# Patient Record
Sex: Female | Born: 1955 | Race: Black or African American | Hispanic: No | Smoking: Never smoker
Health system: Southern US, Community
[De-identification: ages and names within clinical notes are randomized; demographics above are authoritative.]

## PROBLEM LIST (undated history)

## (undated) DIAGNOSIS — N318 Other neuromuscular dysfunction of bladder: Secondary | ICD-10-CM

## (undated) DIAGNOSIS — E119 Type 2 diabetes mellitus without complications: Secondary | ICD-10-CM

## (undated) DIAGNOSIS — I700 Atherosclerosis of aorta: Secondary | ICD-10-CM

## (undated) DIAGNOSIS — E2839 Other primary ovarian failure: Secondary | ICD-10-CM

## (undated) DIAGNOSIS — E1140 Type 2 diabetes mellitus with diabetic neuropathy, unspecified: Secondary | ICD-10-CM

## (undated) DIAGNOSIS — I10 Essential (primary) hypertension: Secondary | ICD-10-CM

## (undated) DIAGNOSIS — F329 Major depressive disorder, single episode, unspecified: Secondary | ICD-10-CM

## (undated) DIAGNOSIS — Z923 Personal history of irradiation: Secondary | ICD-10-CM

## (undated) DIAGNOSIS — S335XXA Sprain of ligaments of lumbar spine, initial encounter: Secondary | ICD-10-CM

## (undated) DIAGNOSIS — G5600 Carpal tunnel syndrome, unspecified upper limb: Secondary | ICD-10-CM

## (undated) DIAGNOSIS — F32A Depression, unspecified: Secondary | ICD-10-CM

## (undated) DIAGNOSIS — J310 Chronic rhinitis: Secondary | ICD-10-CM

## (undated) DIAGNOSIS — N760 Acute vaginitis: Secondary | ICD-10-CM

## (undated) DIAGNOSIS — G4700 Insomnia, unspecified: Secondary | ICD-10-CM

## (undated) DIAGNOSIS — F419 Anxiety disorder, unspecified: Secondary | ICD-10-CM

## (undated) HISTORY — DX: Type 2 diabetes mellitus with diabetic neuropathy, unspecified: E11.40

## (undated) HISTORY — DX: Chronic rhinitis: J31.0

## (undated) HISTORY — DX: Sprain of ligaments of lumbar spine, initial encounter: S33.5XXA

## (undated) HISTORY — DX: Atherosclerosis of aorta: I70.0

## (undated) HISTORY — DX: Carpal tunnel syndrome, unspecified upper limb: G56.00

## (undated) HISTORY — DX: Insomnia, unspecified: G47.00

## (undated) HISTORY — PX: TUBAL LIGATION: SHX77

## (undated) HISTORY — DX: Other primary ovarian failure: E28.39

## (undated) HISTORY — DX: Essential (primary) hypertension: I10

## (undated) HISTORY — DX: Depression, unspecified: F32.A

## (undated) HISTORY — DX: Anxiety disorder, unspecified: F41.9

## (undated) HISTORY — DX: Acute vaginitis: N76.0

## (undated) HISTORY — DX: Major depressive disorder, single episode, unspecified: F32.9

## (undated) HISTORY — DX: Type 2 diabetes mellitus without complications: E11.9

## (undated) HISTORY — DX: Other neuromuscular dysfunction of bladder: N31.8

---

## 2004-04-26 ENCOUNTER — Ambulatory Visit: Payer: Self-pay | Admitting: Family Medicine

## 2005-04-24 ENCOUNTER — Ambulatory Visit: Payer: Self-pay | Admitting: Family Medicine

## 2007-06-08 ENCOUNTER — Ambulatory Visit: Payer: Self-pay | Admitting: Family Medicine

## 2007-06-17 ENCOUNTER — Ambulatory Visit: Payer: Self-pay | Admitting: Gastroenterology

## 2007-06-17 HISTORY — PX: COLONOSCOPY: SHX174

## 2007-09-06 ENCOUNTER — Emergency Department: Payer: Self-pay | Admitting: Emergency Medicine

## 2007-09-06 ENCOUNTER — Other Ambulatory Visit: Payer: Self-pay

## 2008-06-28 ENCOUNTER — Emergency Department: Payer: Self-pay | Admitting: Emergency Medicine

## 2010-03-12 ENCOUNTER — Ambulatory Visit: Payer: Self-pay | Admitting: Family Medicine

## 2010-05-20 ENCOUNTER — Ambulatory Visit: Payer: Self-pay | Admitting: Family Medicine

## 2010-09-10 ENCOUNTER — Ambulatory Visit: Payer: Self-pay | Admitting: Gastroenterology

## 2010-09-12 ENCOUNTER — Ambulatory Visit: Payer: Self-pay | Admitting: Gastroenterology

## 2011-02-24 ENCOUNTER — Emergency Department: Payer: Self-pay | Admitting: Unknown Physician Specialty

## 2011-06-11 ENCOUNTER — Emergency Department: Payer: Self-pay | Admitting: Emergency Medicine

## 2011-06-12 ENCOUNTER — Inpatient Hospital Stay: Payer: Self-pay | Admitting: Internal Medicine

## 2011-06-12 LAB — COMPREHENSIVE METABOLIC PANEL
Albumin: 3.4 g/dL (ref 3.4–5.0)
BUN: 15 mg/dL (ref 7–18)
Bilirubin,Total: 0.7 mg/dL (ref 0.2–1.0)
Calcium, Total: 8.9 mg/dL (ref 8.5–10.1)
Chloride: 102 mmol/L (ref 98–107)
Creatinine: 0.86 mg/dL (ref 0.60–1.30)
EGFR (Non-African Amer.): >60
Osmolality: 272 (ref 275–301)
Potassium: 3.3 mmol/L — ABNORMAL LOW (ref 3.5–5.1)
SGPT (ALT): 19 U/L
Total Protein: 8 g/dL (ref 6.4–8.2)

## 2011-06-12 LAB — CBC
HCT: 38.2 % (ref 35.0–47.0)
MCHC: 32.7 g/dL (ref 32.0–36.0)
MCV: 89 fL (ref 80–100)
RBC: 4.29 10*6/uL (ref 3.80–5.20)
WBC: 33.3 10*3/uL — ABNORMAL HIGH (ref 3.6–11.0)

## 2011-06-12 LAB — MAGNESIUM: Magnesium: 2 mg/dL

## 2011-06-12 LAB — URINALYSIS, COMPLETE
Glucose,UR: >=500 mg/dL (ref 0–75)
Ketone: NEGATIVE
Ph: 6 (ref 4.5–8.0)
RBC,UR: 3 /HPF (ref 0–5)
Specific Gravity: 1.012 (ref 1.003–1.030)
Squamous Epithelial: 13
WBC UR: 6 /HPF (ref 0–5)

## 2011-06-12 LAB — PRO B NATRIURETIC PEPTIDE: B-Type Natriuretic Peptide: 205 pg/mL — ABNORMAL HIGH (ref 0–125)

## 2011-06-12 LAB — CK-MB
CK-MB: <0.5 ng/mL — ABNORMAL LOW (ref 0.5–3.6)
CK-MB: <0.5 ng/mL — ABNORMAL LOW (ref 0.5–3.6)

## 2011-06-12 LAB — HEMOGLOBIN A1C: Hemoglobin A1C: 6.5 % — ABNORMAL HIGH (ref 4.2–6.3)

## 2011-06-12 LAB — CK: CK, Total: 38 U/L (ref 21–215)

## 2011-06-12 LAB — TROPONIN I: Troponin-I: <0.02 ng/mL

## 2011-06-13 LAB — LIPID PANEL: Ldl Cholesterol, Calc: 109 mg/dL — ABNORMAL HIGH (ref 0–100)

## 2011-06-13 LAB — CBC WITH DIFFERENTIAL/PLATELET
Basophil #: 0 10*3/uL (ref 0.0–0.1)
Basophil %: 0 %
Eosinophil #: 0 10*3/uL (ref 0.0–0.7)
HCT: 37.2 % (ref 35.0–47.0)
HGB: 11.9 g/dL — ABNORMAL LOW (ref 12.0–16.0)
Lymphocyte %: 4.1 %
MCH: 28.3 pg (ref 26.0–34.0)
MCHC: 32.1 g/dL (ref 32.0–36.0)
Monocyte #: 0.3 x10 3/mm (ref 0.2–0.9)
Monocyte %: 1.1 %
Neutrophil #: 25.5 10*3/uL — ABNORMAL HIGH (ref 1.4–6.5)
WBC: 26.9 10*3/uL — ABNORMAL HIGH (ref 3.6–11.0)

## 2011-06-13 LAB — BASIC METABOLIC PANEL
Anion Gap: 10 (ref 7–16)
BUN: 19 mg/dL — ABNORMAL HIGH (ref 7–18)
Calcium, Total: 9.1 mg/dL (ref 8.5–10.1)
Chloride: 105 mmol/L (ref 98–107)
Co2: 23 mmol/L (ref 21–32)
Creatinine: 0.74 mg/dL (ref 0.60–1.30)
EGFR (African American): >60
Glucose: 160 mg/dL — ABNORMAL HIGH (ref 65–99)
Osmolality: 281 (ref 275–301)
Potassium: 4 mmol/L (ref 3.5–5.1)
Sodium: 138 mmol/L (ref 136–145)

## 2011-06-13 LAB — TROPONIN I: Troponin-I: <0.02 ng/mL

## 2011-06-13 LAB — CK-MB: CK-MB: <0.5 ng/mL — ABNORMAL LOW (ref 0.5–3.6)

## 2011-06-15 LAB — CBC WITH DIFFERENTIAL/PLATELET
Basophil #: 0 10*3/uL (ref 0.0–0.1)
Eosinophil #: 0 10*3/uL (ref 0.0–0.7)
Eosinophil %: 0 %
HCT: 36.1 % (ref 35.0–47.0)
HGB: 12 g/dL (ref 12.0–16.0)
MCH: 29.2 pg (ref 26.0–34.0)
MCV: 88 fL (ref 80–100)
Platelet: 225 10*3/uL (ref 150–440)
RBC: 4.11 10*6/uL (ref 3.80–5.20)
RDW: 15 % — ABNORMAL HIGH (ref 11.5–14.5)
WBC: 14.2 10*3/uL — ABNORMAL HIGH (ref 3.6–11.0)

## 2011-06-16 LAB — EXPECTORATED SPUTUM ASSESSMENT W GRAM STAIN, RFLX TO RESP C

## 2011-08-21 ENCOUNTER — Ambulatory Visit: Payer: Self-pay | Admitting: Obstetrics and Gynecology

## 2012-08-16 ENCOUNTER — Ambulatory Visit: Payer: Self-pay | Admitting: Obstetrics and Gynecology

## 2012-08-23 ENCOUNTER — Ambulatory Visit: Payer: Self-pay | Admitting: Obstetrics and Gynecology

## 2012-10-01 ENCOUNTER — Other Ambulatory Visit: Payer: Self-pay | Admitting: Family Medicine

## 2012-10-01 LAB — TSH: Thyroid Stimulating Horm: 0.602 u[IU]/mL

## 2013-01-04 ENCOUNTER — Emergency Department: Payer: Self-pay | Admitting: Emergency Medicine

## 2013-01-04 LAB — URINALYSIS, COMPLETE
Glucose,UR: NEGATIVE mg/dL (ref 0–75)
Ketone: NEGATIVE
Nitrite: NEGATIVE
Ph: 6 (ref 4.5–8.0)
Protein: NEGATIVE
Specific Gravity: 1.009 (ref 1.003–1.030)
Squamous Epithelial: 4
WBC UR: 1 /HPF (ref 0–5)

## 2013-01-04 LAB — CBC
HCT: 37.8 % (ref 35.0–47.0)
HGB: 12.7 g/dL (ref 12.0–16.0)
MCH: 29.5 pg (ref 26.0–34.0)
MCV: 88 fL (ref 80–100)
Platelet: 200 10*3/uL (ref 150–440)
RBC: 4.31 10*6/uL (ref 3.80–5.20)
RDW: 14.1 % (ref 11.5–14.5)
WBC: 10.4 10*3/uL (ref 3.6–11.0)

## 2013-01-04 LAB — COMPREHENSIVE METABOLIC PANEL
Albumin: 4 g/dL (ref 3.4–5.0)
Alkaline Phosphatase: 105 U/L (ref 50–136)
BUN: 19 mg/dL — ABNORMAL HIGH (ref 7–18)
Bilirubin,Total: 0.3 mg/dL (ref 0.2–1.0)
Calcium, Total: 9.4 mg/dL (ref 8.5–10.1)
Chloride: 106 mmol/L (ref 98–107)
Co2: 30 mmol/L (ref 21–32)
Creatinine: 0.75 mg/dL (ref 0.60–1.30)
EGFR (African American): >60
EGFR (Non-African Amer.): >60
Glucose: 124 mg/dL — ABNORMAL HIGH (ref 65–99)
Osmolality: 279 (ref 275–301)
Potassium: 3.5 mmol/L (ref 3.5–5.1)
SGOT(AST): 21 U/L (ref 15–37)
SGPT (ALT): 18 U/L (ref 12–78)
Sodium: 138 mmol/L (ref 136–145)
Total Protein: 7.6 g/dL (ref 6.4–8.2)

## 2013-01-04 LAB — PROTIME-INR
INR: 0.9
Prothrombin Time: 12.4 secs (ref 11.5–14.7)

## 2013-01-17 ENCOUNTER — Encounter: Payer: Self-pay | Admitting: Family Medicine

## 2013-01-31 ENCOUNTER — Encounter: Payer: Self-pay | Admitting: Family Medicine

## 2013-06-06 ENCOUNTER — Ambulatory Visit: Payer: Self-pay | Admitting: Family Medicine

## 2013-07-01 ENCOUNTER — Ambulatory Visit: Payer: Self-pay | Admitting: Family Medicine

## 2013-12-28 ENCOUNTER — Ambulatory Visit: Payer: Self-pay | Admitting: Obstetrics and Gynecology

## 2014-02-21 ENCOUNTER — Encounter: Payer: Self-pay | Admitting: *Deleted

## 2014-03-01 ENCOUNTER — Ambulatory Visit: Payer: Self-pay | Admitting: Cardiovascular Disease

## 2014-03-06 ENCOUNTER — Encounter (INDEPENDENT_AMBULATORY_CARE_PROVIDER_SITE_OTHER): Payer: Self-pay

## 2014-03-06 ENCOUNTER — Encounter: Payer: Self-pay | Admitting: Cardiovascular Disease

## 2014-03-06 ENCOUNTER — Ambulatory Visit (INDEPENDENT_AMBULATORY_CARE_PROVIDER_SITE_OTHER): Payer: BLUE CROSS/BLUE SHIELD | Admitting: Cardiovascular Disease

## 2014-03-06 VITALS — BP 114/64 | HR 67 | Ht 63.0 in | Wt 132.0 lb

## 2014-03-06 DIAGNOSIS — R079 Chest pain, unspecified: Secondary | ICD-10-CM

## 2014-03-06 NOTE — Patient Instructions (Addendum)
[facility] MYOVIEW  Your caregiver has ordered a Stress Test with nuclear imaging. The purpose of this test is to evaluate the blood supply to your heart muscle. This procedure is referred to as a "Non-Invasive Stress Test." This is because other than having an IV started in your vein, nothing is inserted or "invades" your body. Cardiac stress tests are done to find areas of poor blood flow to the heart by determining the extent of coronary artery disease (CAD). Some patients exercise on a treadmill, which naturally increases the blood flow to your heart, while others who are  unable to walk on a treadmill due to physical limitations have a pharmacologic/chemical stress agent called Lexiscan . This medicine will mimic walking on a treadmill by temporarily increasing your coronary blood flow.   Please note: these test may take anywhere between 2-4 hours to complete  PLEASE REPORT TO [facility]  THE VOLUNTEERS AT THE FIRST DESK WILL DIRECT YOU WHERE TO GO  Date of Procedure:_____________________________________  Arrival Time for Procedure:______________________________  *Please call our office at [phone] to let us know a good time to set this up  Instructions regarding medication:   _X_ : Hold diabetes medication morning of procedure  PLEASE NOTIFY THE OFFICE AT LEAST 24 HOURS IN ADVANCE IF YOU ARE UNABLE TO KEEP YOUR APPOINTMENT.  [phone] AND  PLEASE NOTIFY NUCLEAR MEDICINE AT [facility] AT LEAST 24 HOURS IN ADVANCE IF YOU ARE UNABLE TO KEEP YOUR APPOINTMENT. [phone]  How to prepare for your Myoview test:  1. Do not eat or drink after midnight 2. No caffeine for 24 hours prior to test 3. No smoking 24 hours prior to test. 4. Your medication may be taken with water.  If your doctor stopped a medication because of this test, do not take that medication. 5. Ladies, please do not wear dresses.  Skirts or pants are appropriate. Please wear a short sleeve shirt. 6. No perfume,  cologne or lotion. 7. Wear comfortable walking shoes. No heels!   Call or return to clinic prn if these symptoms worsen or fail to improve as anticipated.

## 2014-03-10 ENCOUNTER — Encounter: Payer: Self-pay | Admitting: Cardiovascular Disease

## 2014-03-10 DIAGNOSIS — R079 Chest pain, unspecified: Secondary | ICD-10-CM | POA: Insufficient documentation

## 2014-03-10 NOTE — Assessment & Plan Note (Signed)
The patient had chest pain with some anginal and some atypical features. Risk factors include age and type 2 diabetes. I recommend evaluation with a treadmill nuclear stress test. I discussed with the patient the importance of lifestyle changes in order to decrease the chance of future coronary artery disease and cardiovascular events. We discussed the importance of controlling risk factors, healthy diet as well as regular exercise. I also explained to him that a normal stress test does not rule out atherosclerosis.

## 2014-03-10 NOTE — Progress Notes (Signed)
Primary care physician: Dr. Thana Ates  HPI  This is a 59 year old female who was referred for evaluation of chest pain. She has no previous cardiac history. She was diagnosed with type 2 diabetes about one year ago. She has no history of hypertension or hyperlipidemia. She has known history of migraine. There is no family history of coronary artery disease.  She started having chest pain about 3 weeks ago. This is described as substernal aching sensation with radiation behind the neck. This has been associated with shortness of breath. The symptoms are usually exertional unless for about 5-10 minutes. They subside with rest. She reports being under stress lately which might be contributing.  Allergies  Allergen Reactions  . Terbinafine And Related      Current Outpatient Prescriptions on File Prior to Visit  Medication Sig Dispense Refill  . aspirin 81 MG tablet Take 81 mg by mouth daily.    . metFORMIN (GLUCOPHAGE) 500 MG tablet Take 500 mg by mouth daily with breakfast.    . SUMAtriptan (IMITREX) 100 MG tablet Take 100 mg by mouth every 2 (two) hours as needed for migraine or headache. May repeat in 2 hours if headache persists or recurs.     No current facility-administered medications on file prior to visit.     Past Medical History  Diagnosis Date  . Hypertension   . Anxiety and depression   . Rhinitis   . Ovarian failure   . Insomnia   . Depressive disorder   . Hyperactivity of bladder   . Atherosclerosis of aorta   . Diabetic neuropathy   . Vaginitis and vulvovaginitis   . Lumbar sprain   . CTS (carpal tunnel syndrome)   . Diabetes mellitus without complication      Past Surgical History  Procedure Laterality Date  . Tubal ligation    . Cesarean section       Family History  Problem Relation Age of Onset  . Hypertension Mother   . Diabetes Mother      History   Social History  . Marital Status: Divorced    Spouse Name: N/A    Number of Children: N/A    . Years of Education: N/A   Occupational History  . Not on file.   Social History Main Topics  . Smoking status: Never Smoker   . Smokeless tobacco: Never Used  . Alcohol Use: No  . Drug Use: No  . Sexual Activity: Not on file   Other Topics Concern  . Not on file   Social History Narrative     ROS A 10 point review of system was performed. It is negative other than that mentioned in the history of present illness.   PHYSICAL EXAM   BP 114/64 mmHg  Pulse 67  Ht  (1.6 m)  Wt 132 lb (59.875 kg)  BMI 23.39 kg/m2   Constitutional: She is oriented to person, place, and time. She appears well-developed and well-nourished. No distress.  HENT: No nasal discharge.  Head: Normocephalic and atraumatic.  Eyes: Pupils are equal and round. No discharge.  Neck: Normal range of motion. Neck supple. No JVD present. No thyromegaly present.  Cardiovascular: Normal rate, regular rhythm, normal heart sounds. Exam reveals no gallop and no friction rub. No murmur heard.  Pulmonary/Chest: Effort normal and breath sounds normal. No stridor. No respiratory distress. She has no wheezes. She has no rales. She exhibits no tenderness.  Abdominal: Soft. Bowel sounds are normal. She exhibits no distension.  There is no tenderness. There is no rebound and no guarding.  Musculoskeletal: Normal range of motion. She exhibits no edema and no tenderness.  Neurological: She is alert and oriented to person, place, and time. Coordination normal.  Skin: Skin is warm and dry. No rash noted. She is not diaphoretic. No erythema. No pallor.  Psychiatric: She has a normal mood and affect. Her behavior is normal. Judgment and thought content normal.     ZOX:WRUEAEKG:Sinus  Rhythm  Low voltage in precordial leads.   -  Nonspecific T-abnormality.   ABNORMAL    ASSESSMENT AND PLAN

## 2014-06-25 NOTE — Discharge Summary (Signed)
PATIENT NAME:  Laurie Daniels, Laurie Daniels MR#:  [id number] DATE OF BIRTH:  03/13/1955  DATE OF ADMISSION:  06/12/2011 DATE OF DISCHARGE:  06/16/2011  PRIMARY CARE PHYSICIAN: Laurie MascotLemont Morrisey, MD  PULMONOLOGIST: Dr. Kendrick Daniels at [facility].   DISCHARGE DIAGNOSES:  1. Acute hypoxic respiratory failure with left lower lobe pneumonia and acute asthma exacerbation, improving on steroids and antibiotic.  2. Leukocytosis likely due to steroids and/or infection, improving.  3. Hypokalemia, repleted and resolved. 4. Hyperglycemia with hemoglobin A1c of 6.4 likely due to steroids.   SECONDARY DIAGNOSES: History of asthma.   CONSULTATIONS: None.   PROCEDURES/RADIOLOGY: Chest x-ray on 04/11 showed right lung field is clear. Left lower lobe pneumonia.   MAJOR LABORATORY PANEL: Urinalysis on admission was showing trace bacteria, 6 WBCs. Blood cultures x2 were negative on admission. Sputum culture was within normal limits on 04/13.   HISTORY AND SHORT HOSPITAL COURSE: The patient is a 59 year old female with the above-mentioned medical problems who was admitted for acute hypoxic respiratory failure thought to be secondary to left lower lobe pneumonia with acute asthma exacerbation. Please see Dr. Doreatha MartinKamran Lateef's dictated history and physical for further details. She was started on IV antibiotics along with steroids, was slowly improving on the above regimen. Her sugar was elevated, which was thought to be due to steroids. She also had some leukocytosis which was also thought to be due to steroids and/or infection. Her potassium was low and was repleted while in the hospital and had normalized.   Her breathing was much better on the 10th of April and was discharged home in stable condition.   VITAL SIGNS: On the date of discharge her vital signs were as follows: Temperature 98.4, heart rate 94 per minute, respirations 16 per minute, blood pressure 131/69 mm/Hg. She was saturating 96% on room air.   PERTINENT  PHYSICAL EXAMINATION: CARDIOVASCULAR: S1, S2 normal. No murmur, rubs, or gallop. LUNGS: Clear to auscultation bilaterally. No wheezing, rales, rhonchi, or crepitation. ABDOMEN: Soft, benign. NEUROLOGIC: Nonfocal examination. All other physical examination remained at baseline.   DISCHARGE MEDICATIONS:  1. Multivitamin once daily. 2. Aleve 2 tablets once a day.  3. Augmentin 875 mg p.o. b.i.d. for seven days. 4. Prednisone 60 mg p.o. daily taper 10 mg daily until finished.  5. Tussionex p.o. b.i.d. for seven days. 6. Advair 250/50, 1 puff b.i.d.  7. Spiriva once daily.   DISCHARGE DIET: Low sodium, 1800 ADA.   DISCHARGE ACTIVITY: As tolerated.   DISCHARGE INSTRUCTIONS AND FOLLOW-UP: The patient was instructed to follow-up with her primary care physician, Dr. Dennison MascotLemont Daniels, in 1 to 2 weeks. She will need follow-up with  Pulmonary, Dr. Kendrick Daniels, in 2 to 3 weeks.   TOTAL TIME DISCHARGING THIS PATIENT: 55 minutes.   ____________________________ Ellamae SiaVipul S. Sherryll BurgerShah, MD vss:rbg D: 06/18/2011 10:35:13 ET T: 06/19/2011 11:08:06 ET JOB#: [id number]  cc: Laurie MascotLemont Morrisey, MD Lupita Leashouglas B. McQuaid, MD Ellamae SiaVIPUL S [facility] MD ELECTRONICALLY SIGNED 06/19/2011 13:22

## 2014-06-25 NOTE — H&P (Signed)
PATIENT NAME:  Laurie Daniels, SCALIA MR#:  [id number] DATE OF BIRTH:  1955/05/29  DATE OF ADMISSION:  06/12/2011  REFERRING PHYSICIAN: Dr. Ladona Ridgel   PRIMARY CARE PHYSICIAN: Dennison Mascot, MD   CHIEF COMPLAINT: "I have a cold in my chest".  HISTORY OF PRESENT ILLNESS: The patient is a pleasant 59 year old female with past medical history of asthma who presents to the Emergency Department with the above-mentioned chief complaint. The patient states that over the past three weeks she's had symptoms of what she describes as a chest cold including cough productive of reddish-brown sputum and some shortness of breath and also now with some wheezing. Symptoms have been persistent over the past three weeks and worse over the past week and past couple of days. She also reports subjective fever yesterday as well as chills. Her shortness of breath appears to be worsening. She is not on any treatment or therapy for her asthma at baseline and uses no inhalers at home. 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 She reports mild pain along the left posterior ribs induced by cough. The patient denies any hemoptysis. Denies overt chest pain. Otherwise, she is without specific complaints at this time. She came to the ER for further evaluation. She was noted to have moderate respiratory distress with some wheezing and some rhonchi. An ABG was obtained revealing hypoxemia. She was noted to have elevated WBC count of 33,000. Chest x-ray was obtained and suggestive of left lower lobe infiltrate suspicious for pneumonia. Thereafter, hospitalist services were contacted for further evaluation and for hospital admission.   Of note, the patient came to the ER yesterday for her symptoms but states that the wait was too long in the ER and thereafter she left and went home but did not feel any better, hence, she returned to the ER today.   PAST SURGICAL HISTORY: Cesarean section.    PAST MEDICAL HISTORY: Asthma.   ALLERGIES: No known drug allergies.   HOME MEDICATIONS:  1. Daily multivitamin once a day.  2. Aleve Cold and Sinus 220 mg two tablets p.o. p.r.n.  FAMILY HISTORY: Mother deceased at age 21, history of bone cancer. Father's deceased at age 44, history of lung cancer. Had history of tobacco abuse.   SOCIAL HISTORY: Negative for tobacco, alcohol, or illicit drug use. [place] sister and daughter. She works for a retirement home.   REVIEW OF SYSTEMS: CONSTITUTIONAL: Reports subjective fever and chills. Denies fatigue. Denies any recent changes in weight or weakness. Has some left posterior rib pain induced by cough. HEAD/EYES: Denies headache or blurry vision. ENT: Denies tinnitus, earache, nasal discharge, or sore throat. RESPIRATORY: Reports shortness of breath, cough, and wheezing. Denies hemoptysis. CARDIOVASCULAR: Denies chest pain or heart palpitations or lower extremity edema. GI: Denies nausea, vomiting, diarrhea, constipation, melena, hematochezia, or abdominal pain. GU: Denies any dysuria or hematuria. ENDOCRINE: Denies heat or cold tolerance. HEME/LYMPH: She denies any easy bruising or bleeding. INTEGUMENTARY: Denies rashes or lesions. MUSCULOSKELETAL: Has left posterior rib pain caused by cough, otherwise denies any back pain or any shoulder pain or any other joint pain currently. NEUROLOGIC: Denies headache, numbness, tingling, or dysarthria. PSYCHIATRIC: Denies depression or anxiety.   PHYSICAL EXAMINATION:   VITAL SIGNS: Temperature 97.3, pulse 117, respirations 22, blood pressure 133/66, oxygen sat 98% on room air.   GENERAL: The patient is alert and oriented. She is tachypneic and in mild respiratory  distress but able to speak in full sentences.   HEENT: Normocephalic, atraumatic. Pupils equal, round, and reactive to light and accommodation. Extraocular muscles are intact. Anicteric sclerae.  Conjunctivae pink. Hearing intact to voice. Nares without drainage. Oral mucosa moist without lesions.   NECK: Supple with full range of motion. No JVD, lymphadenopathy, or carotid bruits bilaterally. No thyromegaly or tenderness to palpation over thyroid gland.   CHEST: Increased respiratory effort. The patient is tachypneic but not using her accessory respiratory muscles to breathe. She has decreased and tight breath sounds bilaterally with scattered rhonchi and also left lower lobe crackles as well as mild diffuse wheezing.   CARDIOVASCULAR: S1, S2 positive. Regular rate and rhythm. No murmurs, rubs, or gallops. PMI is non-lateralized.   ABDOMEN: Soft, nontender, nondistended. Normoactive bowel sounds. No hepatosplenomegaly or palpable masses. No hernias.   EXTREMITIES: No clubbing, cyanosis, or edema. Pedal pulses are palpable bilaterally.   SKIN: No suspicious rashes or lesions. Skin turgor is good.   LYMPH: No cervical lymphadenopathy.   NEUROLOGICAL: The patient is alert, oriented x3. Cranial nerves II through XII grossly intact. No focal deficits.   PSYCH: Pleasant female with appropriate affect.   LABS/STUDIES: EKG with sinus tachycardia at 112 beats per minute with normal axis, normal intervals, nonspecific T wave abnormality. She also has inferior Q waves which were also noted on EKG from 06/28/2008, however, Q waves are more prominent on today's EKG.  ABG on room air pH 7.48, pCO2 31, pO2 61, bicarb 23.1.   CMP sodium 135, potassium 3.3, chloride 102, bicarb 25, BUN 15, creatinine 0.86, glucose 123, calcium 8.9, anion gap 8. Total protein, albumin, total bilirubin, AST, ALT and alkaline phosphatase levels within normal limits.   CBC within normal limits except for WBC elevated at 33.3.   Troponin less than 0.02. BNP 205.   Chest x-ray, PA and lateral, hazy infiltrate left lower lobe posteriorly and findings are consistent with pneumonia. Right lung field is clear. Heart size  is normal.   ASSESSMENT AND PLAN: This is a 59 year old female with history of asthma here with worsening productive cough associated with shortness of breath, wheezing, fevers and chills, and left posterior rib pain induced by cough noted to have left lower lobe pneumonia by chest x-ray who is hypoxemic by ABG; also noted to have leukocytosis.  IMPRESSION:  1. Acute hypoxemia with respiratory failure.  2. Left lower lobe pneumonia, likely community-acquired pneumonia.  3. Acute asthma exacerbation secondary to pneumonia.  4. Leukocytosis likely due to pneumonia.  5. Hypokalemia.  6. Abnormal EKG with inferior Q waves.   PLAN: 1. Will admit patient to the hospital for further evaluation and management.  2. Will keep patient on supplemental oxygen for now given her hypoxemia although her oxygen sats on room air are currently 98%. Will gradually wean her off oxygen once her overall clinical condition starts to improve.  3. Will obtain blood and sputum cultures and start empiric IV antibiotics in the form of Rocephin and azithromycin and monitor clinical response.  4. Will start IV steroids as well and provide bronchodilator support with scheduled nebs and also p.r.n. albuterol MDI.  5. For leukocytosis, will follow her WBC count closely with antibiotics and steroids and also follow-up culture data.  6. For hypokalemia, will replace by giving a dose of potassium chloride and then rechecking serum potassium in the morning and also check magnesium level.  7. In regards to her abnormal EKG, she has some inferior Q waves which  were noted on prior EKG but Q waves are more prominent on today's EKG. She denies any chest pain. She has no known risk factors for coronary artery disease. Will risk stratify by checking her fasting lipid profile. Also check hemoglobin A1c. Will cycle the patient's cardiac enzymes. Will consider aspirin therapy especially if she is noted to be hyperlipidemic but would also  recommend referring the patient to Cardiology as an outpatient for stress testing and for further evaluation but will defer this decision to primary team.  8. DVT prophylaxis with SCDs and TEDs.   CODE STATUS: FULL CODE.     TIME SPENT ON ADMISSION: Approximately 45 minutes.   ____________________________ Elon AlasKamran N. Birch Farino, MD knl:drc D: 06/12/2011 16:45:57 ET T: 06/12/2011 17:07:03 ET JOB#: [id number]  cc: Elon AlasKamran N. Phoebie Shad, MD, <Dictator> Dennison MascotLemont Morrisey, MD Elon AlasKAMRAN N Imaad Reuss MD ELECTRONICALLY SIGNED 06/24/2011 17:52

## 2014-10-12 ENCOUNTER — Encounter (INDEPENDENT_AMBULATORY_CARE_PROVIDER_SITE_OTHER): Payer: Self-pay

## 2014-10-12 ENCOUNTER — Ambulatory Visit: Payer: Self-pay | Admitting: Family Medicine

## 2014-10-12 ENCOUNTER — Ambulatory Visit (INDEPENDENT_AMBULATORY_CARE_PROVIDER_SITE_OTHER): Payer: BLUE CROSS/BLUE SHIELD | Admitting: Family Medicine

## 2014-10-12 ENCOUNTER — Encounter: Payer: Self-pay | Admitting: Family Medicine

## 2014-10-12 VITALS — BP 138/78 | HR 77 | Temp 98.0°F | Resp 16 | Ht 63.0 in | Wt 131.1 lb

## 2014-10-12 DIAGNOSIS — G4700 Insomnia, unspecified: Secondary | ICD-10-CM | POA: Diagnosis not present

## 2014-10-12 DIAGNOSIS — E785 Hyperlipidemia, unspecified: Secondary | ICD-10-CM

## 2014-10-12 DIAGNOSIS — R0789 Other chest pain: Secondary | ICD-10-CM

## 2014-10-12 DIAGNOSIS — R7303 Prediabetes: Secondary | ICD-10-CM | POA: Insufficient documentation

## 2014-10-12 DIAGNOSIS — T300 Burn of unspecified body region, unspecified degree: Secondary | ICD-10-CM | POA: Diagnosis not present

## 2014-10-12 DIAGNOSIS — K589 Irritable bowel syndrome without diarrhea: Secondary | ICD-10-CM

## 2014-10-12 DIAGNOSIS — R7309 Other abnormal glucose: Secondary | ICD-10-CM

## 2014-10-12 LAB — POCT GLYCOSYLATED HEMOGLOBIN (HGB A1C): Hemoglobin A1C: 6.2

## 2014-10-12 LAB — POCT UA - MICROALBUMIN: Microalbumin Ur, POC: 0 mg/L

## 2014-10-12 LAB — GLUCOSE, POCT (MANUAL RESULT ENTRY): POC Glucose: 122 mg/dl — AB (ref 70–99)

## 2014-10-12 MED ORDER — SILVER SULFADIAZINE 1 % EX CREA: 1.0000 "application " | TOPICAL_CREAM | Freq: Every day | CUTANEOUS | Status: DC

## 2014-10-12 MED ORDER — ZOLPIDEM TARTRATE 5 MG PO TABS: 5.0000 mg | ORAL_TABLET | Freq: Every evening | ORAL | Status: DC | PRN
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
## 2014-10-25 ENCOUNTER — Other Ambulatory Visit: Payer: Self-pay | Admitting: Family Medicine

## 2015-01-18 ENCOUNTER — Encounter: Payer: Self-pay | Admitting: Family Medicine

## 2015-01-18 ENCOUNTER — Ambulatory Visit (INDEPENDENT_AMBULATORY_CARE_PROVIDER_SITE_OTHER): Payer: BLUE CROSS/BLUE SHIELD | Admitting: Family Medicine

## 2015-01-18 VITALS — BP 128/70 | HR 77 | Temp 98.1°F | Resp 18 | Ht 63.0 in | Wt 123.5 lb

## 2015-01-18 DIAGNOSIS — E049 Nontoxic goiter, unspecified: Secondary | ICD-10-CM | POA: Diagnosis not present

## 2015-01-18 DIAGNOSIS — Z23 Encounter for immunization: Secondary | ICD-10-CM | POA: Diagnosis not present

## 2015-01-18 DIAGNOSIS — R002 Palpitations: Secondary | ICD-10-CM | POA: Diagnosis not present

## 2015-01-18 DIAGNOSIS — F411 Generalized anxiety disorder: Secondary | ICD-10-CM

## 2015-01-18 DIAGNOSIS — Z0000 Encounter for general adult medical examination without abnormal findings: Secondary | ICD-10-CM

## 2015-01-18 DIAGNOSIS — G4700 Insomnia, unspecified: Secondary | ICD-10-CM | POA: Diagnosis not present

## 2015-01-18 MED ORDER — PAROXETINE HCL 10 MG PO TABS: 10.0000 mg | ORAL_TABLET | Freq: Every day | ORAL | Status: DC

## 2015-01-18 MED ORDER — ZOLPIDEM TARTRATE 5 MG PO TABS: 5.0000 mg | ORAL_TABLET | Freq: Every evening | ORAL | Status: DC | PRN

## 2015-01-18 NOTE — Progress Notes (Signed)
Name: Laurie DredgeKatie N Daniels   MRN: [id number]    DOB: May 30, 1955   Date:01/18/2015       Progress Note  Subjective  Chief Complaint  Chief Complaint  Patient presents with  . Annual Exam    HPI  59 year old presenting for annual H&P.  Anxiety and palpitations  Patient admits to many sick family stressors. She also has a recurrent episodes of irregular heartbeat with palpitations. She seems not to be able to gain weight so she is eating ravenously she describes. She is not exercising with any regularity. There is a problem heat or cold intolerance and it is no history of hair loss. His note she has a sister who has had thyroid goiter and also thyroid cancer.  Goiter  Patient was noted on this exam to have enlargement of the thyroid gland and the above-mentioned symptoms under anxiety palpitations  Past Medical History  Diagnosis Date  . Hypertension   . Anxiety and depression   . Rhinitis   . Ovarian failure   . Insomnia   . Depressive disorder   . Hyperactivity of bladder   . Atherosclerosis of aorta (HCC)   . Diabetic neuropathy (HCC)   . Vaginitis and vulvovaginitis   . Lumbar sprain   . CTS (carpal tunnel syndrome)   . Diabetes mellitus without complication [facility](HCC)     Social History  Substance Use Topics  . Smoking status: Never Smoker   . Smokeless tobacco: Never Used  . Alcohol Use: No     Current outpatient prescriptions:  .  aspirin 81 MG tablet, Take 81 mg by mouth daily., Disp: , Rfl:  .  metFORMIN (GLUCOPHAGE) 500 MG tablet, TAKE 1 TABLET BY MOUTH EVERY DAY, Disp: 30 tablet, Rfl: 5 .  silver sulfADIAZINE (SILVADENE) 1 % cream, Apply 1 application topically daily., Disp: 50 g, Rfl: 0 .  silver sulfADIAZINE (SILVADENE) 1 % cream, Apply 1 application topically daily., Disp: 50 g, Rfl: 0 .  SUMAtriptan (IMITREX) 100 MG tablet, Take 100 mg by mouth every 2 (two) hours as needed for migraine or headache. May repeat in 2 hours if headache persists or recurs., Disp: ,  Rfl:  .  zolpidem (AMBIEN) 5 MG tablet, Take 1 tablet (5 mg total) by mouth at bedtime as needed for sleep., Disp: 15 tablet, Rfl: 1 .  zolpidem (AMBIEN) 5 MG tablet, Take 1 tablet (5 mg total) by mouth at bedtime as needed for sleep., Disp: 30 tablet, Rfl: 2  Allergies  Allergen Reactions  . Terbinafine And Related Other (See Comments)    hallucinations    Review of Systems  Constitutional: Negative for fever, chills and weight loss.  HENT: Negative for congestion, hearing loss, sore throat and tinnitus.   Eyes: Negative for blurred vision, double vision and redness.  Respiratory: Negative for cough, hemoptysis and shortness of breath.   Cardiovascular: Positive for palpitations. Negative for chest pain, orthopnea, claudication and leg swelling.  Gastrointestinal: Negative for heartburn, nausea, vomiting, diarrhea, constipation and blood in stool.       No dysphagia  Genitourinary: Negative for dysuria, urgency, frequency and hematuria.  Musculoskeletal: Negative for myalgias, back pain, joint pain, falls and neck pain.  Skin: Negative for itching.  Neurological: Negative for dizziness, tingling, tremors, focal weakness, seizures, loss of consciousness, weakness and headaches.  Endo/Heme/Allergies: Does not bruise/bleed easily.  Psychiatric/Behavioral: Negative for depression and substance abuse. The patient is nervous/anxious and has insomnia.      Objective  Filed Vitals:  01/18/15 0935  BP: 128/70  Pulse: 77  Temp: 98.1 F (36.7 C)  Resp: 18  Height:  (1.6 m)  Weight: 123 lb 8 oz (56.019 kg)  SpO2: 97%     Physical Exam  Constitutional: She is oriented to person, place, and time and well-developed, well-nourished, and in no distress.  HENT:  Head: Normocephalic.  Eyes: EOM are normal. Pupils are equal, round, and reactive to light.  Neck: Thyromegaly present.  The isthmus is palpably enlarged. It is nontender and not adherent to other tissues. No bruits  appreciated  Cardiovascular: Normal rate, regular rhythm and normal heart sounds.   No murmur heard. Pulmonary/Chest: Effort normal and breath sounds normal.  Breast exam is within normal limits  Abdominal: Soft. Bowel sounds are normal.  Genitourinary: Vagina normal, uterus normal, cervix normal, right adnexa normal and left adnexa normal. Guaiac negative stool. No vaginal discharge found.  Musculoskeletal: Normal range of motion. She exhibits no edema.  Neurological: She is alert and oriented to person, place, and time. No cranial nerve deficit. Gait normal.  Skin: Skin is warm and dry. No rash noted.  Psychiatric: Memory normal.  Patient is anxious and loquacious      Assessment & Plan  1. Annual physical exam  - Cytology - non pap - DG Bone Density; Future - CBC - Lipid Profile - TSH - Comprehensive Metabolic Panel (CMET)  2. Insomnia Probably associated with her anxiety - zolpidem (AMBIEN) 5 MG tablet; Take 1 tablet (5 mg total) by mouth at bedtime as needed for sleep.  Dispense: 15 tablet; Refill: 1  3. Need for influenza vaccination Given today - Flu Vaccine QUAD 36+ mos PF IM (Fluarix & Fluzone Quad PF)  4. Thyroid goiter Ultrasound of thyroid  5. Generalized anxiety disorder Trial of Paxil - PARoxetine (PAXIL) 10 MG tablet; Take 1 tablet (10 mg total) by mouth daily.  Dispense: 30 tablet; Refill: 5  6. Palpitations Probably associated with her anxiety but must also rule out hyperthyroidism to continue quarter - TSH - EKG 12-Lead

## 2015-01-26 LAB — PAP LB (LIQUID-BASED): PAP SMEAR COMMENT: 0

## 2015-02-19 ENCOUNTER — Encounter: Payer: Self-pay | Admitting: Family Medicine

## 2015-02-19 ENCOUNTER — Ambulatory Visit (INDEPENDENT_AMBULATORY_CARE_PROVIDER_SITE_OTHER): Payer: BLUE CROSS/BLUE SHIELD | Admitting: Family Medicine

## 2015-02-19 VITALS — BP 142/72 | HR 70 | Temp 98.7°F | Resp 18 | Ht 63.0 in | Wt 123.2 lb

## 2015-02-19 DIAGNOSIS — K580 Irritable bowel syndrome with diarrhea: Secondary | ICD-10-CM

## 2015-02-19 DIAGNOSIS — I10 Essential (primary) hypertension: Secondary | ICD-10-CM

## 2015-02-19 DIAGNOSIS — R739 Hyperglycemia, unspecified: Secondary | ICD-10-CM

## 2015-02-19 DIAGNOSIS — F411 Generalized anxiety disorder: Secondary | ICD-10-CM

## 2015-02-19 LAB — GLUCOSE, POCT (MANUAL RESULT ENTRY): POC GLUCOSE: 101 mg/dL — AB (ref 70–99)

## 2015-02-19 LAB — POCT GLYCOSYLATED HEMOGLOBIN (HGB A1C): Hemoglobin A1C: 6.2

## 2015-02-19 MED ORDER — METFORMIN HCL 500 MG PO TABS: 500.0000 mg | ORAL_TABLET | ORAL | Status: DC

## 2015-02-19 MED ORDER — PAROXETINE HCL 20 MG PO TABS: 20.0000 mg | ORAL_TABLET | Freq: Every day | ORAL | Status: DC

## 2015-02-19 NOTE — Progress Notes (Signed)
Name: Laurie DredgeKatie N Daniels   MRN: [id number]    DOB: March 28, 1955   Date:02/19/2015       Progress Note  Subjective  Chief Complaint  Chief Complaint  Patient presents with  . Palpitations    Pt here for 1 month follow up  . Thyroid Problem    Hypertension This is a chronic problem. The current episode started more than 1 year ago. The problem has been gradually worsening since onset. The problem is uncontrolled. Associated symptoms include anxiety and palpitations. Pertinent negatives include no blurred vision, chest pain, headaches, neck pain, orthopnea or shortness of breath. There are no associated agents to hypertension. Risk factors for coronary artery disease include diabetes mellitus, dyslipidemia, family history and stress.    Anxiety  Since being on paroxetine patient is noted some modest improvement. She continues to experience some episodes of Of palpitations but these are less frequent. She also experiences some intermittent queasy sensation in her stomach without true nausea or vomiting. She is in the midst of changing her her housing situation and she is renovating her moving into her mother's house who expired some years ago. This is helping to release some financial stress. Since going through divorce she has less stress related to her ex-husband's [place]. Her children are not a source of frustration currently. Finances however areas well as regards situation. There is no consideration of suicidal homicidal ideation. Crying spells less frequent.  Diabetes  Patient presents for follow-up of diabetes which is present for >3  years. Is currently on a regimen of ME TFORMIN 500 QD . Patient states COMPLIANCE  with their diet and exercise. There's been no hypoglycemic episodes and there 0 polyuria polydipsia polyphagia. His average fasting glucoses been in the low around - with a high around - . There is no end organ disease.  Last diabetic eye exam was -.   Last visit with dietitian  was 1 YR. Last microalbumin was obtained 8/16 =0 .     Past Medical History  Diagnosis Date  . Hypertension   . Anxiety and depression   . Rhinitis   . Ovarian failure   . Insomnia   . Depressive disorder   . Hyperactivity of bladder   . Atherosclerosis of aorta (HCC)   . Diabetic neuropathy (HCC)   . Vaginitis and vulvovaginitis   . Lumbar sprain   . CTS (carpal tunnel syndrome)   . Diabetes mellitus without complication [facility](HCC)     Social History  Substance Use Topics  . Smoking status: Never Smoker   . Smokeless tobacco: Never Used  . Alcohol Use: No     Current outpatient prescriptions:  .  aspirin 81 MG tablet, Take 81 mg by mouth daily., Disp: , Rfl:  .  metFORMIN (GLUCOPHAGE) 500 MG tablet, TAKE 1 TABLET BY MOUTH EVERY DAY, Disp: 30 tablet, Rfl: 5 .  PARoxetine (PAXIL) 10 MG tablet, Take 1 tablet (10 mg total) by mouth daily., Disp: 30 tablet, Rfl: 5 .  silver sulfADIAZINE (SILVADENE) 1 % cream, Apply 1 application topically daily., Disp: 50 g, Rfl: 0 .  silver sulfADIAZINE (SILVADENE) 1 % cream, Apply 1 application topically daily., Disp: 50 g, Rfl: 0 .  SUMAtriptan (IMITREX) 100 MG tablet, Take 100 mg by mouth every 2 (two) hours as needed for migraine or headache. May repeat in 2 hours if headache persists or recurs., Disp: , Rfl:  .  zolpidem (AMBIEN) 5 MG tablet, Take 1 tablet (5 mg total) by mouth at  bedtime as needed for sleep., Disp: 15 tablet, Rfl: 1  Allergies  Allergen Reactions  . Terbinafine And Related Other (See Comments)    hallucinations    Review of Systems  Constitutional: Negative for fever, chills and weight loss.  HENT: Negative for congestion, hearing loss, sore throat and tinnitus.   Eyes: Negative for blurred vision, double vision and redness.  Respiratory: Negative for cough, hemoptysis and shortness of breath.   Cardiovascular: Positive for palpitations. Negative for chest pain, orthopnea, claudication and leg swelling.   Gastrointestinal: Negative for heartburn, nausea, vomiting, diarrhea, constipation and blood in stool.  Genitourinary: Negative for dysuria, urgency, frequency and hematuria.  Musculoskeletal: Negative for myalgias, back pain, joint pain, falls and neck pain.  Skin: Negative for itching.  Neurological: Negative for dizziness, tingling, tremors, focal weakness, seizures, loss of consciousness, weakness and headaches.  Endo/Heme/Allergies: Does not bruise/bleed easily.  Psychiatric/Behavioral: Negative for depression and substance abuse. The patient is nervous/anxious. The patient does not have insomnia.      Objective  Filed Vitals:   02/19/15 0817  BP: 142/72  Pulse: 70  Temp: 98.7 F (37.1 C)  Resp: 18  Height:  (1.6 m)  Weight: 123 lb 3 oz (55.877 kg)  SpO2: 97%     Physical Exam  Constitutional: She is oriented to person, place, and time and well-developed, well-nourished, and in no distress.  HENT:  Head: Normocephalic.  Eyes: EOM are normal. Pupils are equal, round, and reactive to light.  Neck: Normal range of motion. No thyromegaly present.  Cardiovascular: Normal rate, regular rhythm and normal heart sounds.   No murmur heard. Pulmonary/Chest: Effort normal and breath sounds normal.  Abdominal: Soft. Bowel sounds are normal.  Musculoskeletal: Normal range of motion. She exhibits no edema.  Neurological: She is alert and oriented to person, place, and time. No cranial nerve deficit. Gait normal.  Skin: Skin is warm and dry. No rash noted.  Psychiatric: Memory and affect normal.      Assessment & Plan  1. Type II diabetes mellitus, well controlled (HCC)  - POCT HgB A1C - POCT Glucose (CBG)  2. Generalized anxiety disorder * - PARoxetine (PAXIL) 20 MG tablet; Take 1 tablet (20 mg total) by mouth daily.  Dispense: 30 tablet; Refill: 5  3. Irritable bowel syndrome with diarrhea 2ND TO 2  4. Essential hypertension ADD ACE OR ARG

## 2015-03-05 DIAGNOSIS — E1129 Type 2 diabetes mellitus with other diabetic kidney complication: Secondary | ICD-10-CM | POA: Insufficient documentation

## 2015-03-05 DIAGNOSIS — R809 Proteinuria, unspecified: Secondary | ICD-10-CM | POA: Insufficient documentation

## 2015-06-21 ENCOUNTER — Ambulatory Visit: Payer: BLUE CROSS/BLUE SHIELD | Admitting: Family Medicine

## 2015-07-24 ENCOUNTER — Ambulatory Visit: Payer: BLUE CROSS/BLUE SHIELD | Admitting: Family Medicine

## 2015-08-13 ENCOUNTER — Ambulatory Visit (INDEPENDENT_AMBULATORY_CARE_PROVIDER_SITE_OTHER): Payer: BLUE CROSS/BLUE SHIELD | Admitting: Family Medicine

## 2015-08-13 ENCOUNTER — Encounter: Payer: Self-pay | Admitting: Family Medicine

## 2015-08-13 VITALS — BP 118/76 | HR 79 | Temp 98.2°F | Resp 16 | Ht 63.0 in | Wt 127.7 lb

## 2015-08-13 DIAGNOSIS — F329 Major depressive disorder, single episode, unspecified: Secondary | ICD-10-CM

## 2015-08-13 DIAGNOSIS — K581 Irritable bowel syndrome with constipation: Secondary | ICD-10-CM | POA: Diagnosis not present

## 2015-08-13 DIAGNOSIS — Z1159 Encounter for screening for other viral diseases: Secondary | ICD-10-CM

## 2015-08-13 DIAGNOSIS — G43009 Migraine without aura, not intractable, without status migrainosus: Secondary | ICD-10-CM

## 2015-08-13 DIAGNOSIS — E049 Nontoxic goiter, unspecified: Secondary | ICD-10-CM | POA: Diagnosis not present

## 2015-08-13 DIAGNOSIS — G4700 Insomnia, unspecified: Secondary | ICD-10-CM

## 2015-08-13 DIAGNOSIS — E785 Hyperlipidemia, unspecified: Secondary | ICD-10-CM | POA: Diagnosis not present

## 2015-08-13 DIAGNOSIS — Z23 Encounter for immunization: Secondary | ICD-10-CM

## 2015-08-13 DIAGNOSIS — R351 Nocturia: Secondary | ICD-10-CM | POA: Insufficient documentation

## 2015-08-13 DIAGNOSIS — J309 Allergic rhinitis, unspecified: Secondary | ICD-10-CM | POA: Insufficient documentation

## 2015-08-13 DIAGNOSIS — E119 Type 2 diabetes mellitus without complications: Secondary | ICD-10-CM

## 2015-08-13 DIAGNOSIS — R634 Abnormal weight loss: Secondary | ICD-10-CM

## 2015-08-13 DIAGNOSIS — R5383 Other fatigue: Secondary | ICD-10-CM | POA: Diagnosis not present

## 2015-08-13 DIAGNOSIS — F411 Generalized anxiety disorder: Secondary | ICD-10-CM

## 2015-08-13 DIAGNOSIS — G5600 Carpal tunnel syndrome, unspecified upper limb: Secondary | ICD-10-CM | POA: Insufficient documentation

## 2015-08-13 DIAGNOSIS — I700 Atherosclerosis of aorta: Secondary | ICD-10-CM | POA: Insufficient documentation

## 2015-08-13 MED ORDER — MIRTAZAPINE 15 MG PO TBDP: 15.0000 mg | ORAL_TABLET | Freq: Every day | ORAL | Status: DC

## 2015-08-13 MED ORDER — POLYETHYLENE GLYCOL 3350 17 GM/SCOOP PO POWD: 17.0000 g | Freq: Every day | ORAL | Status: DC

## 2015-08-13 MED ORDER — CITALOPRAM HYDROBROMIDE 20 MG PO TABS: 20.0000 mg | ORAL_TABLET | Freq: Every day | ORAL | Status: DC
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
## 2015-08-14 ENCOUNTER — Other Ambulatory Visit
Admission: RE | Admit: 2015-08-14 | Discharge: 2015-08-14 | Disposition: A | Discharge status: Home / Self Care | Payer: BLUE CROSS/BLUE SHIELD | Source: Ambulatory Visit | Attending: Family Medicine | Admitting: Family Medicine

## 2015-08-14 DIAGNOSIS — R5383 Other fatigue: Secondary | ICD-10-CM | POA: Diagnosis present

## 2015-08-14 LAB — CBC WITH DIFFERENTIAL/PLATELET
Basophils Absolute: 0 10*3/uL (ref 0–0.1)
Eosinophils Absolute: 0.2 10*3/uL (ref 0–0.7)
Eosinophils Relative: 2 %
HEMATOCRIT: 39.7 % (ref 35.0–47.0)
HEMOGLOBIN: 13.3 g/dL (ref 12.0–16.0)
Lymphocytes Relative: 19 %
Lymphs Abs: 2 10*3/uL (ref 1.0–3.6)
MCH: 29 pg (ref 26.0–34.0)
MCHC: 33.5 g/dL (ref 32.0–36.0)
MCV: 86.5 fL (ref 80.0–100.0)
Monocytes Absolute: 0.7 10*3/uL (ref 0.2–0.9)
NEUTROS ABS: 8 10*3/uL — AB (ref 1.4–6.5)
Platelets: 205 10*3/uL (ref 150–440)
RBC: 4.59 MIL/uL (ref 3.80–5.20)
RDW: 14 % (ref 11.5–14.5)
WBC: 11 10*3/uL (ref 3.6–11.0)

## 2015-08-14 LAB — COMPREHENSIVE METABOLIC PANEL
ALBUMIN: 4.6 g/dL (ref 3.5–5.0)
ALT: 14 U/L (ref 14–54)
ANION GAP: 8 (ref 5–15)
AST: 17 U/L (ref 15–41)
Alkaline Phosphatase: 82 U/L (ref 38–126)
BILIRUBIN TOTAL: 0.6 mg/dL (ref 0.3–1.2)
BUN: 16 mg/dL (ref 6–20)
CALCIUM: 9.7 mg/dL (ref 8.9–10.3)
CO2: 28 mmol/L (ref 22–32)
Chloride: 103 mmol/L (ref 101–111)
Creatinine, Ser: 0.54 mg/dL (ref 0.44–1.00)
GFR calc Af Amer: >60 mL/min (ref >60)
GFR calc non Af Amer: >60 mL/min (ref >60)
GLUCOSE: 112 mg/dL — AB (ref 65–99)
Potassium: 3.9 mmol/L (ref 3.5–5.1)
Sodium: 139 mmol/L (ref 135–145)
TOTAL PROTEIN: 7.9 g/dL (ref 6.5–8.1)

## 2015-08-14 LAB — LIPID PANEL
CHOLESTEROL: 244 mg/dL — AB (ref 0–200)
HDL: 75 mg/dL (ref >40)
LDL Cholesterol: 153 mg/dL — ABNORMAL HIGH (ref 0–99)
Total CHOL/HDL Ratio: 3.3 RATIO
Triglycerides: 79 mg/dL (ref <150)
VLDL: 16 mg/dL (ref 0–40)

## 2015-08-14 LAB — VITAMIN B12: Vitamin B-12: 311 pg/mL (ref 180–914)

## 2015-08-15 ENCOUNTER — Ambulatory Visit (INDEPENDENT_AMBULATORY_CARE_PROVIDER_SITE_OTHER): Payer: BLUE CROSS/BLUE SHIELD

## 2015-08-15 ENCOUNTER — Other Ambulatory Visit: Payer: Self-pay | Admitting: Family Medicine

## 2015-08-15 DIAGNOSIS — D519 Vitamin B12 deficiency anemia, unspecified: Secondary | ICD-10-CM | POA: Diagnosis not present

## 2015-08-15 LAB — THYROID PANEL WITH TSH
FREE THYROXINE INDEX: 1.7 (ref 1.2–4.9)
T3 Uptake Ratio: 29 % (ref 24–39)
T4, Total: 6 ug/dL (ref 4.5–12.0)
TSH: 0.537 u[IU]/mL (ref 0.450–4.500)

## 2015-08-15 LAB — HIV ANTIBODY (ROUTINE TESTING W REFLEX): HIV SCREEN 4TH GENERATION: NONREACTIVE

## 2015-08-15 LAB — VITAMIN D 25 HYDROXY (VIT D DEFICIENCY, FRACTURES): Vit D, 25-Hydroxy: 30.3 ng/mL (ref 30.0–100.0)

## 2015-08-15 LAB — HEMOGLOBIN A1C: Hgb A1c MFr Bld: 6.3 % — ABNORMAL HIGH (ref 4.0–6.0)

## 2015-08-15 LAB — HEPATITIS C ANTIBODY

## 2015-08-15 MED ORDER — ATORVASTATIN CALCIUM 40 MG PO TABS: 40.0000 mg | ORAL_TABLET | Freq: Every day | ORAL | Status: DC

## 2015-08-15 MED ORDER — CYANOCOBALAMIN 1000 MCG/ML IJ SOLN
1000.0000 ug | Freq: Once | INTRAMUSCULAR | Status: AC
  Administered 2015-08-15: 1000 ug via INTRAMUSCULAR

## 2015-08-17 ENCOUNTER — Ambulatory Visit
Admission: RE | Admit: 2015-08-17 | Discharge: 2015-08-17 | Disposition: A | Discharge status: Home / Self Care | Payer: BLUE CROSS/BLUE SHIELD | Source: Ambulatory Visit | Attending: Family Medicine | Admitting: Family Medicine

## 2015-08-17 DIAGNOSIS — E042 Nontoxic multinodular goiter: Secondary | ICD-10-CM | POA: Insufficient documentation

## 2015-08-17 DIAGNOSIS — E049 Nontoxic goiter, unspecified: Secondary | ICD-10-CM | POA: Diagnosis present

## 2015-08-19 ENCOUNTER — Other Ambulatory Visit: Payer: Self-pay | Admitting: Family Medicine

## 2015-08-19 DIAGNOSIS — E041 Nontoxic single thyroid nodule: Secondary | ICD-10-CM | POA: Insufficient documentation

## 2015-08-29 ENCOUNTER — Telehealth: Payer: Self-pay

## 2015-08-29 NOTE — Telephone Encounter (Signed)
Patient states she is taking Metformin 500 mg 1 tablet daily currently and needs a refill on medication.

## 2015-08-30 MED ORDER — METFORMIN HCL ER (OSM) 500 MG PO TB24
500.0000 mg | ORAL_TABLET | Freq: Every day | ORAL | Status: DC
Start: 2015-08-30 — End: 2015-09-17

## 2015-08-30 NOTE — Telephone Encounter (Signed)
done

## 2015-09-17 ENCOUNTER — Ambulatory Visit (INDEPENDENT_AMBULATORY_CARE_PROVIDER_SITE_OTHER): Payer: BLUE CROSS/BLUE SHIELD | Admitting: Family Medicine

## 2015-09-17 ENCOUNTER — Encounter: Payer: Self-pay | Admitting: Family Medicine

## 2015-09-17 VITALS — HR 78 | Temp 98.8°F | Resp 16 | Wt 132.8 lb

## 2015-09-17 DIAGNOSIS — E049 Nontoxic goiter, unspecified: Secondary | ICD-10-CM | POA: Diagnosis not present

## 2015-09-17 DIAGNOSIS — Z1239 Encounter for other screening for malignant neoplasm of breast: Secondary | ICD-10-CM

## 2015-09-17 DIAGNOSIS — F411 Generalized anxiety disorder: Secondary | ICD-10-CM | POA: Diagnosis not present

## 2015-09-17 DIAGNOSIS — E785 Hyperlipidemia, unspecified: Secondary | ICD-10-CM | POA: Diagnosis not present

## 2015-09-17 DIAGNOSIS — Z23 Encounter for immunization: Secondary | ICD-10-CM | POA: Diagnosis not present

## 2015-09-17 DIAGNOSIS — F329 Major depressive disorder, single episode, unspecified: Secondary | ICD-10-CM

## 2015-09-17 DIAGNOSIS — E538 Deficiency of other specified B group vitamins: Secondary | ICD-10-CM

## 2015-09-17 DIAGNOSIS — G4700 Insomnia, unspecified: Secondary | ICD-10-CM | POA: Diagnosis not present

## 2015-09-17 MED ORDER — CITALOPRAM HYDROBROMIDE 20 MG PO TABS: 20.0000 mg | ORAL_TABLET | Freq: Every day | ORAL | Status: DC

## 2015-09-17 MED ORDER — ATORVASTATIN CALCIUM 40 MG PO TABS: 40.0000 mg | ORAL_TABLET | Freq: Every day | ORAL | Status: DC

## 2015-09-17 MED ORDER — METFORMIN HCL ER (OSM) 500 MG PO TB24: 500.0000 mg | ORAL_TABLET | Freq: Every day | ORAL | Status: DC

## 2015-09-17 MED ORDER — CYANOCOBALAMIN 1000 MCG/ML IJ SOLN
1000.0000 ug | Freq: Once | INTRAMUSCULAR | Status: AC
  Administered 2015-09-17: 1000 ug via INTRAMUSCULAR

## 2015-09-17 MED ORDER — MIRTAZAPINE 15 MG PO TBDP: 15.0000 mg | ORAL_TABLET | Freq: Every day | ORAL | Status: DC
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
## 2015-09-18 ENCOUNTER — Other Ambulatory Visit: Payer: Self-pay | Admitting: Family Medicine

## 2015-09-18 NOTE — Telephone Encounter (Signed)
Patient requesting refill. 

## 2015-12-25 ENCOUNTER — Telehealth: Payer: Self-pay

## 2015-12-25 DIAGNOSIS — E785 Hyperlipidemia, unspecified: Secondary | ICD-10-CM

## 2015-12-25 DIAGNOSIS — F329 Major depressive disorder, single episode, unspecified: Secondary | ICD-10-CM

## 2015-12-25 DIAGNOSIS — G4700 Insomnia, unspecified: Secondary | ICD-10-CM

## 2015-12-25 DIAGNOSIS — F411 Generalized anxiety disorder: Secondary | ICD-10-CM

## 2015-12-25 NOTE — Telephone Encounter (Signed)
Left message for pt to call and schedule appt ( but Dr please let me know where to schedule these patients) and then will let dr know it has been done so that the Dr will call meds in.

## 2015-12-25 NOTE — Telephone Encounter (Signed)
Patient requesting refill of Metformin to [facility].

## 2015-12-26 NOTE — Telephone Encounter (Signed)
She can be scheduled for Nov, once scheduled I will send refills

## 2015-12-26 NOTE — Telephone Encounter (Signed)
LEFT MESSAGE TO [facility] APPT

## 2016-01-01 ENCOUNTER — Telehealth: Payer: Self-pay | Admitting: Family Medicine

## 2016-01-01 ENCOUNTER — Other Ambulatory Visit: Payer: Self-pay | Admitting: Family Medicine

## 2016-01-01 NOTE — Telephone Encounter (Signed)
[place] [facility] on Atorvastain 40mg , Citalopram 20mg , metformin ER 500mg , and Mirtazapine ODT Tab 15mg . (P) [phone]

## 2016-01-02 ENCOUNTER — Other Ambulatory Visit: Payer: Self-pay | Admitting: Family Medicine

## 2016-01-02 DIAGNOSIS — F329 Major depressive disorder, single episode, unspecified: Secondary | ICD-10-CM

## 2016-01-02 DIAGNOSIS — F5101 Primary insomnia: Secondary | ICD-10-CM

## 2016-01-02 DIAGNOSIS — Z1231 Encounter for screening mammogram for malignant neoplasm of breast: Secondary | ICD-10-CM

## 2016-01-02 DIAGNOSIS — F411 Generalized anxiety disorder: Secondary | ICD-10-CM

## 2016-01-02 DIAGNOSIS — E7800 Pure hypercholesterolemia, unspecified: Secondary | ICD-10-CM

## 2016-01-02 MED ORDER — ATORVASTATIN CALCIUM 40 MG PO TABS: 40.0000 mg | ORAL_TABLET | Freq: Every day | ORAL | 0 refills | Status: DC

## 2016-01-02 MED ORDER — MIRTAZAPINE 15 MG PO TBDP: 15.0000 mg | ORAL_TABLET | Freq: Every day | ORAL | 0 refills | Status: DC

## 2016-01-02 MED ORDER — CITALOPRAM HYDROBROMIDE 20 MG PO TABS: 20.0000 mg | ORAL_TABLET | Freq: Every day | ORAL | 0 refills | Status: DC

## 2016-01-02 MED ORDER — METFORMIN HCL ER (OSM) 500 MG PO TB24: 500.0000 mg | ORAL_TABLET | Freq: Every day | ORAL | 0 refills | Status: DC

## 2016-01-03 NOTE — Telephone Encounter (Signed)
Spoke to Laurie Daniels and explained that pt needs an appointment before Dr. Carlynn PurlSowles will refill medications

## 2016-01-30 ENCOUNTER — Encounter: Payer: Self-pay | Admitting: General Surgery

## 2016-01-30 ENCOUNTER — Encounter: Payer: Self-pay | Admitting: Family Medicine

## 2016-01-30 ENCOUNTER — Ambulatory Visit (INDEPENDENT_AMBULATORY_CARE_PROVIDER_SITE_OTHER): Payer: BLUE CROSS/BLUE SHIELD | Admitting: Family Medicine

## 2016-01-30 VITALS — BP 138/78 | HR 78 | Temp 98.5°F | Resp 16 | Ht 63.0 in | Wt 136.0 lb

## 2016-01-30 DIAGNOSIS — Z1211 Encounter for screening for malignant neoplasm of colon: Secondary | ICD-10-CM | POA: Diagnosis not present

## 2016-01-30 DIAGNOSIS — E1129 Type 2 diabetes mellitus with other diabetic kidney complication: Secondary | ICD-10-CM | POA: Diagnosis not present

## 2016-01-30 DIAGNOSIS — K5909 Other constipation: Secondary | ICD-10-CM

## 2016-01-30 DIAGNOSIS — G4700 Insomnia, unspecified: Secondary | ICD-10-CM

## 2016-01-30 DIAGNOSIS — R809 Proteinuria, unspecified: Secondary | ICD-10-CM | POA: Diagnosis not present

## 2016-01-30 DIAGNOSIS — E7800 Pure hypercholesterolemia, unspecified: Secondary | ICD-10-CM

## 2016-01-30 DIAGNOSIS — G43009 Migraine without aura, not intractable, without status migrainosus: Secondary | ICD-10-CM | POA: Diagnosis not present

## 2016-01-30 DIAGNOSIS — F411 Generalized anxiety disorder: Secondary | ICD-10-CM

## 2016-01-30 DIAGNOSIS — E049 Nontoxic goiter, unspecified: Secondary | ICD-10-CM

## 2016-01-30 DIAGNOSIS — F329 Major depressive disorder, single episode, unspecified: Secondary | ICD-10-CM | POA: Diagnosis not present

## 2016-01-30 DIAGNOSIS — M19049 Primary osteoarthritis, unspecified hand: Secondary | ICD-10-CM | POA: Diagnosis not present

## 2016-01-30 DIAGNOSIS — Z23 Encounter for immunization: Secondary | ICD-10-CM

## 2016-01-30 DIAGNOSIS — E538 Deficiency of other specified B group vitamins: Secondary | ICD-10-CM

## 2016-01-30 LAB — POCT UA - MICROALBUMIN: Microalbumin Ur, POC: 100 mg/L

## 2016-01-30 LAB — POCT GLYCOSYLATED HEMOGLOBIN (HGB A1C): Hemoglobin A1C: 6.8

## 2016-01-30 MED ORDER — CITALOPRAM HYDROBROMIDE 20 MG PO TABS: 20.0000 mg | ORAL_TABLET | Freq: Every day | ORAL | 0 refills | Status: DC

## 2016-01-30 MED ORDER — METFORMIN HCL ER (OSM) 500 MG PO TB24: 500.0000 mg | ORAL_TABLET | Freq: Every day | ORAL | 0 refills | Status: DC

## 2016-01-30 MED ORDER — ATORVASTATIN CALCIUM 40 MG PO TABS: 40.0000 mg | ORAL_TABLET | Freq: Every day | ORAL | 0 refills | Status: DC

## 2016-01-30 MED ORDER — CYANOCOBALAMIN 1000 MCG/ML IJ SOLN
1000.0000 ug | Freq: Once | INTRAMUSCULAR | Status: AC
  Administered 2016-01-30: 1000 ug via INTRAMUSCULAR

## 2016-01-30 MED ORDER — LINACLOTIDE 145 MCG PO CAPS: 145.0000 ug | ORAL_CAPSULE | Freq: Every day | ORAL | 2 refills | Status: DC

## 2016-01-30 MED ORDER — MIRTAZAPINE 15 MG PO TABS: 15.0000 mg | ORAL_TABLET | Freq: Every day | ORAL | 0 refills | Status: DC

## 2016-01-30 MED ORDER — B-12 1000 MCG SL SUBL: 1.0000 | SUBLINGUAL_TABLET | Freq: Every day | SUBLINGUAL | 0 refills | Status: DC

## 2016-01-30 MED ORDER — LOSARTAN POTASSIUM 25 MG PO TABS: 25.0000 mg | ORAL_TABLET | Freq: Every day | ORAL | 1 refills | Status: DC
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
## 2016-02-07 ENCOUNTER — Ambulatory Visit
Admission: RE | Admit: 2016-02-07 | Discharge: 2016-02-07 | Disposition: A | Discharge status: Home / Self Care | Payer: BLUE CROSS/BLUE SHIELD | Source: Ambulatory Visit | Attending: Family Medicine | Admitting: Family Medicine

## 2016-02-07 DIAGNOSIS — Z1231 Encounter for screening mammogram for malignant neoplasm of breast: Secondary | ICD-10-CM | POA: Diagnosis present

## 2016-02-12 ENCOUNTER — Ambulatory Visit (INDEPENDENT_AMBULATORY_CARE_PROVIDER_SITE_OTHER): Payer: BLUE CROSS/BLUE SHIELD | Admitting: General Surgery

## 2016-02-12 ENCOUNTER — Encounter: Payer: Self-pay | Admitting: General Surgery

## 2016-02-12 VITALS — BP 148/80 | HR 68 | Resp 12 | Ht 63.0 in | Wt 136.0 lb

## 2016-02-12 DIAGNOSIS — Z1211 Encounter for screening for malignant neoplasm of colon: Secondary | ICD-10-CM | POA: Insufficient documentation

## 2016-02-12 NOTE — Patient Instructions (Addendum)
   Return in April 2019 for screening colonoscopy.

## 2016-02-12 NOTE — Progress Notes (Signed)
Patient ID: Laurie Daniels, female   DOB: [birth year], 60 y.o.   MRN: [id number]  Chief Complaint  Patient presents with  . Colonoscopy    HPI Laurie DredgeKatie N Daniels is a 60 y.o. female here today for a evaluation of a screening colonoscopy. Last colonoscopy was in 2009. Denies any gastrointestinal issues. Bowels move about 2 times a week, no bleeding.  HPI  Past Medical History:  Diagnosis Date  . Anxiety and depression   . Atherosclerosis of aorta (HCC)   . CTS (carpal tunnel syndrome)   . Depressive disorder   . Diabetes mellitus without complication (HCC)   . Diabetic neuropathy (HCC)   . Hyperactivity of bladder   . Hypertension   . Insomnia   . Lumbar sprain   . Ovarian failure   . Rhinitis   . Vaginitis and vulvovaginitis     Past Surgical History:  Procedure Laterality Date  . CESAREAN SECTION    . COLONOSCOPY  06/17/2007   Dr Servando SnareWohl  . TUBAL LIGATION      Family History  Problem Relation Age of Onset  . Hypertension Mother   . Diabetes Mother   . Colon cancer Neg Hx     Social History Social History  Substance Use Topics  . Smoking status: Never Smoker  . Smokeless tobacco: Never Used  . Alcohol use No    Allergies  Allergen Reactions  . Terbinafine And Related Other (See Comments)    hallucinations    Current Outpatient Prescriptions  Medication Sig Dispense Refill  . aspirin 81 MG tablet Take 81 mg by mouth daily.    Marland Kitchen. atorvastatin (LIPITOR) 40 MG tablet Take 1 tablet (40 mg total) by mouth daily. 90 tablet 0  . citalopram (CELEXA) 20 MG tablet Take 1 tablet (20 mg total) by mouth daily. 90 tablet 0  . Cyanocobalamin (B-12) 1000 MCG SUBL Place 1 tablet under the tongue daily. 30 each 0  . linaclotide (LINZESS) 145 MCG CAPS capsule Take 1 capsule (145 mcg total) by mouth daily before breakfast. 30 capsule 2  . losartan (COZAAR) 25 MG tablet Take 1 tablet (25 mg total) by mouth daily. For kidney - DM 90 tablet 1  . metformin (FORTAMET) 500 MG (OSM) 24 hr  tablet Take 1 tablet (500 mg total) by mouth daily with breakfast. 90 tablet 0  . mirtazapine (REMERON) 15 MG tablet Take 1 tablet (15 mg total) by mouth at bedtime. 90 tablet 0  . polyethylene glycol powder (GLYCOLAX/MIRALAX) powder Take 17 g by mouth daily. Mix with 8-10 ounces of fluids ( juice/tea) 3350 g 2  . SUMAtriptan (IMITREX) 100 MG tablet Take 100 mg by mouth every 2 (two) hours as needed for migraine or headache. May repeat in 2 hours if headache persists or recurs.     No current facility-administered medications for this visit.     Review of Systems Review of Systems  Constitutional: Negative.   Respiratory: Negative.   Cardiovascular: Negative.   Gastrointestinal: Positive for constipation. Negative for diarrhea and nausea.  Psychiatric/Behavioral: Decreased concentration: .asasc.    Blood pressure (!) 148/80, pulse 68, resp. rate 12, height 5\' 3"  (1.6 m), weight 136 lb (61.7 kg).  Physical Exam Physical Exam  Constitutional: She is oriented to person, place, and time. She appears well-developed and well-nourished.  Neurological: She is alert and oriented to person, place, and time.  Psychiatric: She has a normal mood and affect.    Data Reviewed April 2009 colonoscopy report completed  by Midge Miniumarren Wohl M.D. reviewed. Normal study.  Assessment    Average risk for colon cancer.    Plan          Return in April 2019 for screening colonoscopy.  This information has been scribed by Laurie Daniels, Laurie Daniels,Laurie Daniels. Marland Kitchen.   Laurie Daniels, Laurie Daniels 02/12/2016, 7:25 PM

## 2016-05-01 ENCOUNTER — Ambulatory Visit (INDEPENDENT_AMBULATORY_CARE_PROVIDER_SITE_OTHER): Payer: BLUE CROSS/BLUE SHIELD | Admitting: Family Medicine

## 2016-05-01 ENCOUNTER — Encounter: Payer: Self-pay | Admitting: Family Medicine

## 2016-05-01 VITALS — BP 138/75 | HR 80 | Temp 98.5°F | Resp 17 | Ht 63.0 in | Wt 140.5 lb

## 2016-05-01 DIAGNOSIS — G43009 Migraine without aura, not intractable, without status migrainosus: Secondary | ICD-10-CM | POA: Diagnosis not present

## 2016-05-01 DIAGNOSIS — E049 Nontoxic goiter, unspecified: Secondary | ICD-10-CM

## 2016-05-01 DIAGNOSIS — M26622 Arthralgia of left temporomandibular joint: Secondary | ICD-10-CM | POA: Diagnosis not present

## 2016-05-01 DIAGNOSIS — F411 Generalized anxiety disorder: Secondary | ICD-10-CM

## 2016-05-01 DIAGNOSIS — K5909 Other constipation: Secondary | ICD-10-CM

## 2016-05-01 DIAGNOSIS — E7800 Pure hypercholesterolemia, unspecified: Secondary | ICD-10-CM

## 2016-05-01 DIAGNOSIS — F329 Major depressive disorder, single episode, unspecified: Secondary | ICD-10-CM | POA: Diagnosis not present

## 2016-05-01 DIAGNOSIS — E119 Type 2 diabetes mellitus without complications: Secondary | ICD-10-CM | POA: Diagnosis not present

## 2016-05-01 DIAGNOSIS — E538 Deficiency of other specified B group vitamins: Secondary | ICD-10-CM

## 2016-05-01 DIAGNOSIS — G4709 Other insomnia: Secondary | ICD-10-CM

## 2016-05-01 LAB — GLUCOSE, POCT (MANUAL RESULT ENTRY): POC GLUCOSE: 156 mg/dL — AB (ref 70–99)

## 2016-05-01 LAB — POCT GLYCOSYLATED HEMOGLOBIN (HGB A1C): HEMOGLOBIN A1C: 6.5

## 2016-05-01 MED ORDER — DAPAGLIFLOZIN PROPANEDIOL 5 MG PO TABS: 5.0000 mg | ORAL_TABLET | Freq: Every day | ORAL | 3 refills | Status: DC

## 2016-05-01 MED ORDER — ATORVASTATIN CALCIUM 40 MG PO TABS
40.0000 mg | ORAL_TABLET | Freq: Every day | ORAL | 1 refills | Status: DC
Start: 2016-05-01 — End: 2016-07-23

## 2016-05-01 MED ORDER — MELOXICAM 15 MG PO TABS: 15.0000 mg | ORAL_TABLET | Freq: Every day | ORAL | 0 refills | Status: DC
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
## 2016-07-10 HISTORY — PX: OTHER SURGICAL HISTORY: SHX169

## 2016-07-16 ENCOUNTER — Other Ambulatory Visit: Payer: Self-pay | Admitting: Family Medicine

## 2016-07-16 DIAGNOSIS — F329 Major depressive disorder, single episode, unspecified: Secondary | ICD-10-CM

## 2016-07-16 DIAGNOSIS — F411 Generalized anxiety disorder: Secondary | ICD-10-CM

## 2016-07-16 DIAGNOSIS — G4700 Insomnia, unspecified: Secondary | ICD-10-CM

## 2016-07-16 NOTE — Telephone Encounter (Signed)
Patient requesting refill of Celexa to [facility]. 

## 2016-07-21 ENCOUNTER — Telehealth: Payer: Self-pay | Admitting: Family Medicine
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
## 2016-07-23 ENCOUNTER — Encounter: Payer: Self-pay | Admitting: Family Medicine

## 2016-07-23 ENCOUNTER — Ambulatory Visit (INDEPENDENT_AMBULATORY_CARE_PROVIDER_SITE_OTHER): Payer: BLUE CROSS/BLUE SHIELD | Admitting: Family Medicine

## 2016-07-23 VITALS — BP 126/74 | HR 105 | Temp 98.8°F | Resp 16 | Ht 63.0 in | Wt 142.4 lb

## 2016-07-23 DIAGNOSIS — R809 Proteinuria, unspecified: Secondary | ICD-10-CM | POA: Diagnosis not present

## 2016-07-23 DIAGNOSIS — E119 Type 2 diabetes mellitus without complications: Secondary | ICD-10-CM | POA: Diagnosis not present

## 2016-07-23 DIAGNOSIS — E1129 Type 2 diabetes mellitus with other diabetic kidney complication: Secondary | ICD-10-CM | POA: Diagnosis not present

## 2016-07-23 DIAGNOSIS — F411 Generalized anxiety disorder: Secondary | ICD-10-CM | POA: Diagnosis not present

## 2016-07-23 DIAGNOSIS — G43009 Migraine without aura, not intractable, without status migrainosus: Secondary | ICD-10-CM | POA: Diagnosis not present

## 2016-07-23 DIAGNOSIS — G4700 Insomnia, unspecified: Secondary | ICD-10-CM

## 2016-07-23 DIAGNOSIS — Z9889 Other specified postprocedural states: Secondary | ICD-10-CM

## 2016-07-23 DIAGNOSIS — E7800 Pure hypercholesterolemia, unspecified: Secondary | ICD-10-CM

## 2016-07-23 DIAGNOSIS — F341 Dysthymic disorder: Secondary | ICD-10-CM

## 2016-07-23 DIAGNOSIS — E049 Nontoxic goiter, unspecified: Secondary | ICD-10-CM | POA: Diagnosis not present

## 2016-07-23 DIAGNOSIS — F329 Major depressive disorder, single episode, unspecified: Secondary | ICD-10-CM

## 2016-07-23 DIAGNOSIS — E538 Deficiency of other specified B group vitamins: Secondary | ICD-10-CM | POA: Diagnosis not present

## 2016-07-23 LAB — POCT GLYCOSYLATED HEMOGLOBIN (HGB A1C): Hemoglobin A1C: 6.5

## 2016-07-23 MED ORDER — MIRTAZAPINE 15 MG PO TABS: 15.0000 mg | ORAL_TABLET | Freq: Every day | ORAL | 1 refills | Status: DC

## 2016-07-23 MED ORDER — CITALOPRAM HYDROBROMIDE 20 MG PO TABS: 20.0000 mg | ORAL_TABLET | Freq: Every day | ORAL | 1 refills | Status: DC

## 2016-07-23 MED ORDER — DAPAGLIFLOZIN PROPANEDIOL 5 MG PO TABS: 5.0000 mg | ORAL_TABLET | Freq: Every day | ORAL | 3 refills | Status: DC

## 2016-07-23 MED ORDER — ATORVASTATIN CALCIUM 40 MG PO TABS: 40.0000 mg | ORAL_TABLET | Freq: Every day | ORAL | 1 refills | Status: DC

## 2016-07-23 MED ORDER — LOSARTAN POTASSIUM 25 MG PO TABS: 25.0000 mg | ORAL_TABLET | Freq: Every day | ORAL | 1 refills | Status: DC
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
## 2016-07-24 ENCOUNTER — Telehealth: Payer: Self-pay

## 2016-07-24 DIAGNOSIS — E119 Type 2 diabetes mellitus without complications: Secondary | ICD-10-CM

## 2016-07-24 MED ORDER — METFORMIN HCL 500 MG PO TABS: 500.0000 mg | ORAL_TABLET | Freq: Every day | ORAL | 1 refills | Status: DC

## 2016-07-24 NOTE — Telephone Encounter (Signed)
Spoke with patient and let her know I have ordered Metformin - this should be more reasonably priced.  She has done well on this in the past, but did not like the size of the pills. She may cut these in half to take if needed. If she is still not able to tolerate, she will call back.

## 2016-07-24 NOTE — Telephone Encounter (Signed)
Pt called and stated that she has not met her deductible for this year yet and that the farxiga will cost her $300 she would like to be placed on something cheaper

## 2017-01-20 ENCOUNTER — Ambulatory Visit: Payer: BLUE CROSS/BLUE SHIELD | Admitting: Family Medicine

## 2017-01-27 ENCOUNTER — Encounter: Payer: Self-pay | Admitting: Family Medicine

## 2017-01-27 ENCOUNTER — Ambulatory Visit: Payer: BLUE CROSS/BLUE SHIELD | Admitting: Family Medicine

## 2017-01-27 VITALS — BP 132/80 | HR 70 | Resp 12 | Ht 63.0 in | Wt 148.6 lb

## 2017-01-27 DIAGNOSIS — F411 Generalized anxiety disorder: Secondary | ICD-10-CM | POA: Diagnosis not present

## 2017-01-27 DIAGNOSIS — Z1231 Encounter for screening mammogram for malignant neoplasm of breast: Secondary | ICD-10-CM

## 2017-01-27 DIAGNOSIS — R809 Proteinuria, unspecified: Secondary | ICD-10-CM

## 2017-01-27 DIAGNOSIS — Z79899 Other long term (current) drug therapy: Secondary | ICD-10-CM

## 2017-01-27 DIAGNOSIS — E7800 Pure hypercholesterolemia, unspecified: Secondary | ICD-10-CM | POA: Diagnosis not present

## 2017-01-27 DIAGNOSIS — E049 Nontoxic goiter, unspecified: Secondary | ICD-10-CM

## 2017-01-27 DIAGNOSIS — E119 Type 2 diabetes mellitus without complications: Secondary | ICD-10-CM

## 2017-01-27 DIAGNOSIS — F341 Dysthymic disorder: Secondary | ICD-10-CM | POA: Diagnosis not present

## 2017-01-27 DIAGNOSIS — E1129 Type 2 diabetes mellitus with other diabetic kidney complication: Secondary | ICD-10-CM | POA: Diagnosis not present

## 2017-01-27 DIAGNOSIS — K5909 Other constipation: Secondary | ICD-10-CM | POA: Diagnosis not present

## 2017-01-27 DIAGNOSIS — G4700 Insomnia, unspecified: Secondary | ICD-10-CM | POA: Diagnosis not present

## 2017-01-27 DIAGNOSIS — Z1239 Encounter for other screening for malignant neoplasm of breast: Secondary | ICD-10-CM

## 2017-01-27 DIAGNOSIS — F329 Major depressive disorder, single episode, unspecified: Secondary | ICD-10-CM

## 2017-01-27 LAB — POCT GLYCOSYLATED HEMOGLOBIN (HGB A1C): Hemoglobin A1C: 6.4

## 2017-01-27 MED ORDER — CITALOPRAM HYDROBROMIDE 20 MG PO TABS: 20.0000 mg | ORAL_TABLET | Freq: Every day | ORAL | 1 refills | Status: DC

## 2017-01-27 MED ORDER — LINACLOTIDE 145 MCG PO CAPS: 145.0000 ug | ORAL_CAPSULE | Freq: Every day | ORAL | 2 refills | Status: DC

## 2017-01-27 MED ORDER — METFORMIN HCL ER 500 MG PO TB24: 500.0000 mg | ORAL_TABLET | Freq: Every day | ORAL | 1 refills | Status: DC

## 2017-01-27 MED ORDER — MIRTAZAPINE 15 MG PO TABS: 15.0000 mg | ORAL_TABLET | Freq: Every day | ORAL | 1 refills | Status: DC

## 2017-01-27 MED ORDER — METFORMIN HCL 500 MG PO TABS: 500.0000 mg | ORAL_TABLET | Freq: Every day | ORAL | 1 refills | Status: DC

## 2017-01-27 MED ORDER — LOSARTAN POTASSIUM 25 MG PO TABS: 25.0000 mg | ORAL_TABLET | Freq: Every day | ORAL | 1 refills | Status: DC

## 2017-01-27 MED ORDER — ATORVASTATIN CALCIUM 40 MG PO TABS: 40.0000 mg | ORAL_TABLET | Freq: Every day | ORAL | 1 refills | Status: DC
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
## 2017-01-28 LAB — CBC WITH DIFFERENTIAL/PLATELET
BASOS PCT: 0.6 %
Basophils Absolute: 58 cells/uL (ref 0–200)
EOS ABS: 398 {cells}/uL (ref 15–500)
Eosinophils Relative: 4.1 %
HEMATOCRIT: 36.2 % (ref 35.0–45.0)
HEMOGLOBIN: 12 g/dL (ref 11.7–15.5)
LYMPHS ABS: 3657 {cells}/uL (ref 850–3900)
MCH: 28.7 pg (ref 27.0–33.0)
MCHC: 33.1 g/dL (ref 32.0–36.0)
MCV: 86.6 fL (ref 80.0–100.0)
MPV: 12.1 fL (ref 7.5–12.5)
Monocytes Relative: 6.1 %
NEUTROS ABS: 4996 {cells}/uL (ref 1500–7800)
Neutrophils Relative %: 51.5 %
Platelets: 224 10*3/uL (ref 140–400)
RBC: 4.18 10*6/uL (ref 3.80–5.10)
RDW: 13.1 % (ref 11.0–15.0)
Total Lymphocyte: 37.7 %
WBC: 9.7 10*3/uL (ref 3.8–10.8)
WBCMIX: 592 {cells}/uL (ref 200–950)

## 2017-01-28 LAB — COMPLETE METABOLIC PANEL WITH GFR
AG Ratio: 1.5 (calc) (ref 1.0–2.5)
ALT: 10 U/L (ref 6–29)
AST: 12 U/L (ref 10–35)
Albumin: 4.3 g/dL (ref 3.6–5.1)
Alkaline phosphatase (APISO): 89 U/L (ref 33–130)
BUN: 17 mg/dL (ref 7–25)
CALCIUM: 9.6 mg/dL (ref 8.6–10.4)
CO2: 26 mmol/L (ref 20–32)
CREATININE: 0.84 mg/dL (ref 0.50–0.99)
Chloride: 104 mmol/L (ref 98–110)
GFR, EST AFRICAN AMERICAN: 86 mL/min/{1.73_m2} (ref >=60)
GFR, EST NON AFRICAN AMERICAN: 74 mL/min/{1.73_m2} (ref >=60)
Globulin: 2.8 g/dL (calc) (ref 1.9–3.7)
Glucose, Bld: 118 mg/dL (ref 65–139)
Potassium: 3.7 mmol/L (ref 3.5–5.3)
Sodium: 138 mmol/L (ref 135–146)
TOTAL PROTEIN: 7.1 g/dL (ref 6.1–8.1)
Total Bilirubin: 0.3 mg/dL (ref 0.2–1.2)

## 2017-01-28 LAB — LIPID PANEL
CHOL/HDL RATIO: 3.6 (calc) (ref <5.0)
CHOLESTEROL: 217 mg/dL — AB (ref <200)
HDL: 60 mg/dL (ref >50)
LDL Cholesterol (Calc): 124 mg/dL (calc) — ABNORMAL HIGH
NON-HDL CHOLESTEROL (CALC): 157 mg/dL — AB (ref <130)
Triglycerides: 210 mg/dL — ABNORMAL HIGH (ref <150)

## 2017-01-28 LAB — TSH: TSH: 0.26 m[IU]/L — AB (ref 0.40–4.50)

## 2017-03-26 ENCOUNTER — Ambulatory Visit
Admission: RE | Admit: 2017-03-26 | Discharge: 2017-03-26 | Disposition: A | Discharge status: Home / Self Care | Payer: BLUE CROSS/BLUE SHIELD | Source: Ambulatory Visit | Attending: Family Medicine | Admitting: Family Medicine

## 2017-03-26 DIAGNOSIS — Z1239 Encounter for other screening for malignant neoplasm of breast: Secondary | ICD-10-CM

## 2017-03-26 DIAGNOSIS — Z1231 Encounter for screening mammogram for malignant neoplasm of breast: Secondary | ICD-10-CM | POA: Diagnosis not present

## 2017-07-09 ENCOUNTER — Encounter: Payer: Self-pay | Admitting: General Surgery

## 2017-07-09 ENCOUNTER — Ambulatory Visit (INDEPENDENT_AMBULATORY_CARE_PROVIDER_SITE_OTHER): Payer: BLUE CROSS/BLUE SHIELD | Admitting: General Surgery

## 2017-07-09 VITALS — BP 132/74 | HR 70 | Resp 14 | Ht 63.0 in | Wt 148.0 lb

## 2017-07-09 DIAGNOSIS — Z1211 Encounter for screening for malignant neoplasm of colon: Secondary | ICD-10-CM | POA: Diagnosis not present

## 2017-07-09 MED ORDER — POLYETHYLENE GLYCOL 3350 17 GM/SCOOP PO POWD: ORAL | 0 refills | Status: DC

## 2017-07-09 NOTE — Patient Instructions (Addendum)
Patient to started her Linzess for four days before the colonoscopy.   Colonoscopy, Adult A colonoscopy is an exam to look at the entire large intestine. During the exam, a lubricated, bendable tube is inserted into the anus and then passed into the rectum, colon, and other parts of the large intestine. A colonoscopy is often done as a part of normal colorectal screening or in response to certain symptoms, such as anemia, persistent diarrhea, abdominal pain, and blood in the stool. The exam can help screen for and diagnose medical problems, including:  Tumors.  Polyps.  Inflammation.  Areas of bleeding.  Tell a health care provider about:  Any allergies you have.  All medicines you are taking, including vitamins, herbs, eye drops, creams, and over-the-counter medicines.  Any problems you or family members have had with anesthetic medicines.  Any blood disorders you have.  Any surgeries you have had.  Any medical conditions you have.  Any problems you have had passing stool. What are the risks? Generally, this is a safe procedure. However, problems may occur, including:  Bleeding.  A tear in the intestine.  A reaction to medicines given during the exam.  Infection (rare).  What happens before the procedure? Eating and drinking restrictions Follow instructions from your health care provider about eating and drinking, which may include:  A few days before the procedure - follow a low-fiber diet. Avoid nuts, seeds, dried fruit, raw fruits, and vegetables.  1-3 days before the procedure - follow a clear liquid diet. Drink only clear liquids, such as clear broth or bouillon, black coffee or tea, clear juice, clear soft drinks or sports drinks, gelatin dessert, and popsicles. Avoid any liquids that contain red or purple dye.  On the day of the procedure - do not eat or drink anything during the 2 hours before the procedure, or within the time period that your health care  provider recommends.  Bowel prep If you were prescribed an oral bowel prep to clean out your colon:  Take it as told by your health care provider. Starting the day before your procedure, you will need to drink a large amount of medicated liquid. The liquid will cause you to have multiple loose stools until your stool is almost clear or light green.  If your skin or anus gets irritated from diarrhea, you may use these to relieve the irritation: ? Medicated wipes, such as adult wet wipes with aloe and vitamin E. ? A skin soothing-product like petroleum jelly.  If you vomit while drinking the bowel prep, take a break for up to 60 minutes and then begin the bowel prep again. If vomiting continues and you cannot take the bowel prep without vomiting, call your health care provider.  General instructions  Ask your health care provider about changing or stopping your regular medicines. This is especially important if you are taking diabetes medicines or blood thinners.  Plan to have someone take you home from the hospital or clinic. What happens during the procedure?  An IV tube may be inserted into one of your veins.  You will be given medicine to help you relax (sedative).  To reduce your risk of infection: ? Your health care team will wash or sanitize their hands. ? Your anal area will be washed with soap.  You will be asked to lie on your side with your knees bent.  Your health care provider will lubricate a long, thin, flexible tube. The tube will have a camera and  a light on the end.  The tube will be inserted into your anus.  The tube will be gently eased through your rectum and colon.  Air will be delivered into your colon to keep it open. You may feel some pressure or cramping.  The camera will be used to take images during the procedure.  A small tissue sample may be removed from your body to be examined under a microscope (biopsy). If any potential problems are found, the  tissue will be sent to a lab for testing.  If small polyps are found, your health care provider may remove them and have them checked for cancer cells.  The tube that was inserted into your anus will be slowly removed. The procedure may vary among health care providers and hospitals. What happens after the procedure?  Your blood pressure, heart rate, breathing rate, and blood oxygen level will be monitored until the medicines you were given have worn off.  Do not drive for 24 hours after the exam.  You may have a small amount of blood in your stool.  You may pass gas and have mild abdominal cramping or bloating due to the air that was used to inflate your colon during the exam.  It is up to you to get the results of your procedure. Ask your health care provider, or the department performing the procedure, when your results will be ready. This information is not intended to replace advice given to you by your health care provider. Make sure you discuss any questions you have with your health care provider. Document Released: 02/15/2000 Document Revised: 12/19/2015 Document Reviewed: 05/01/2015 Elsevier Interactive Patient Education  2018 [employer].

## 2017-07-09 NOTE — Progress Notes (Signed)
Patient ID: Laurie Daniels, female   DOB: [birth year], 62 y.o.   MRN: [id number]  Chief Complaint  Patient presents with  . Colonoscopy    HPI Laurie Daniels is a 62 y.o. female.  Here today for follow up colonoscopy discussion. Last completed 2009.Patient states she moves her bowels every four day, which is normal for her.  The patient reports that she rarely moves her bowels without making use of Linzess She uses every 4-5 days as needed. HPI  Past Medical History:  Diagnosis Date  . Anxiety and depression   . Atherosclerosis of aorta (HCC)   . CTS (carpal tunnel syndrome)   . Depressive disorder   . Diabetes mellitus without complication (HCC)   . Diabetic neuropathy (HCC)   . Hyperactivity of bladder   . Hypertension   . Insomnia   . Lumbar sprain   . Ovarian failure   . Rhinitis   . Vaginitis and vulvovaginitis     Past Surgical History:  Procedure Laterality Date  . CESAREAN SECTION    . COLONOSCOPY  06/17/2007   Dr Servando Snare  . TUBAL LIGATION    . vitrectomy Right 07/10/2016   done at [facility] History  Problem Relation Age of Onset  . Hypertension Mother   . Diabetes Mother   . Colon cancer Neg Hx     Social History Social History   Tobacco Use  . Smoking status: Never Smoker  . Smokeless tobacco: Never Used  Substance Use Topics  . Alcohol use: No    Alcohol/week: 0.0 oz  . Drug use: No    Allergies  Allergen Reactions  . Terbinafine And Related Other (See Comments)    hallucinations    Current Outpatient Medications  Medication Sig Dispense Refill  . aspirin 81 MG tablet Take 81 mg by mouth daily.    Marland Kitchen atorvastatin (LIPITOR) 40 MG tablet Take 1 tablet (40 mg total) by mouth daily. 90 tablet 1  . citalopram (CELEXA) 20 MG tablet Take 1 tablet (20 mg total) by mouth daily. 90 tablet 1  . Cyanocobalamin (B-12) 1000 MCG SUBL Place 1 tablet under the tongue daily. 30 each 0  . linaclotide (LINZESS) 145 MCG CAPS capsule Take 1 capsule (145 mcg  total) by mouth daily before breakfast. 30 capsule 2  . losartan (COZAAR) 25 MG tablet Take 1 tablet (25 mg total) by mouth daily. For kidney - DM 90 tablet 1  . metFORMIN (GLUCOPHAGE-XR) 500 MG 24 hr tablet Take 1 tablet (500 mg total) by mouth daily with breakfast. 90 tablet 1  . mirtazapine (REMERON) 15 MG tablet Take 1 tablet (15 mg total) by mouth at bedtime. 90 tablet 1  . polyethylene glycol powder (GLYCOLAX/MIRALAX) powder 255 grams one bottle for colonoscopy prep 255 g 0   No current facility-administered medications for this visit.     Review of Systems Review of Systems  Constitutional: Negative.   Respiratory: Negative.   Cardiovascular: Negative.     Blood pressure 132/74, pulse 70, resp. rate 14, height  (1.6 m), weight 148 lb (67.1 kg).  Physical Exam Physical Exam  Constitutional: She is oriented to person, place, and time. She appears well-developed and well-nourished.  Cardiovascular: Normal rate, regular rhythm and normal heart sounds.  Pulmonary/Chest: Effort normal and breath sounds normal.  Neurological: She is alert and oriented to person, place, and time.  Skin: Skin is warm and dry.    Data Reviewed  Laboratory studies dated  January 27, 2017 showed a normal CBC with a hemoglobin of 12.0, MCV of 86.6, platelet count of 224,000  and a white blood cell count of 9700.   Comprehensive metabolic panel of the same date was entirely normal.  Assessment    Candidate for screening colonoscopy.    Plan  Colonoscopy with possible biopsy/polypectomy prn: Information regarding the procedure, including its potential risks and complications (including but not limited to perforation of the bowel, which may require emergency surgery to repair, and bleeding) was verbally given to the patient. Educational information regarding lower intestinal endoscopy was given to the patient. Written instructions for how to complete the bowel prep using Miralax were provided. The  importance of drinking ample fluids to avoid dehydration as a result of the prep emphasized.  Patient to started her Linzess daily for four days before the colonoscopy to ensure a good prep.    HPI, Physical Exam, Assessment and Plan have been scribed under the direction and in the presence of Donnalee Curry, MD.  Ples Specter, CMA  I have completed the exam and reviewed the above documentation for accuracy and completeness.  I agree with the above.  Museum/gallery conservator has been used and any errors in dictation or transcription are unintentional.  Donnalee Curry, M.D., F.A.C.S.  Laurie Daniels 07/09/2017, 8:57 PM  Patient has been scheduled for a colonoscopy on 07-22-17 at [facility]. Miralax prescription has been sent in to the patient's pharmacy today. It is okay for patient to continue an 81 mg aspirin once daily. This patient has been asked to hold metformin day of colonoscopy prep and procedure. Colonoscopy instructions have been reviewed with the patient. This patient is aware to call the office if they have further questions.   Nicholes Mango, CMA

## 2017-07-22 ENCOUNTER — Encounter
Admission: RE | Disposition: A | Discharge status: Home / Self Care | Payer: Self-pay | Source: Ambulatory Visit | Attending: General Surgery

## 2017-07-22 ENCOUNTER — Ambulatory Visit: Payer: BLUE CROSS/BLUE SHIELD | Admitting: Registered Nurse

## 2017-07-22 ENCOUNTER — Encounter: Payer: Self-pay | Admitting: Family Medicine

## 2017-07-22 ENCOUNTER — Ambulatory Visit
Admission: RE | Admit: 2017-07-22 | Discharge: 2017-07-22 | Disposition: A | Discharge status: Home / Self Care | Payer: BLUE CROSS/BLUE SHIELD | Source: Ambulatory Visit | Attending: General Surgery | Admitting: General Surgery

## 2017-07-22 ENCOUNTER — Other Ambulatory Visit: Payer: Self-pay

## 2017-07-22 DIAGNOSIS — F418 Other specified anxiety disorders: Secondary | ICD-10-CM | POA: Diagnosis not present

## 2017-07-22 DIAGNOSIS — Z1211 Encounter for screening for malignant neoplasm of colon: Secondary | ICD-10-CM | POA: Insufficient documentation

## 2017-07-22 DIAGNOSIS — Z7982 Long term (current) use of aspirin: Secondary | ICD-10-CM | POA: Insufficient documentation

## 2017-07-22 DIAGNOSIS — I700 Atherosclerosis of aorta: Secondary | ICD-10-CM | POA: Insufficient documentation

## 2017-07-22 DIAGNOSIS — I10 Essential (primary) hypertension: Secondary | ICD-10-CM | POA: Diagnosis not present

## 2017-07-22 DIAGNOSIS — Z79899 Other long term (current) drug therapy: Secondary | ICD-10-CM | POA: Diagnosis not present

## 2017-07-22 DIAGNOSIS — Z7984 Long term (current) use of oral hypoglycemic drugs: Secondary | ICD-10-CM | POA: Insufficient documentation

## 2017-07-22 DIAGNOSIS — E119 Type 2 diabetes mellitus without complications: Secondary | ICD-10-CM | POA: Insufficient documentation

## 2017-07-22 HISTORY — PX: COLONOSCOPY WITH PROPOFOL: SHX5780

## 2017-07-22 LAB — GLUCOSE, CAPILLARY: Glucose-Capillary: 112 mg/dL — ABNORMAL HIGH (ref 65–99)

## 2017-07-22 LAB — HM COLONOSCOPY

## 2017-07-22 SURGERY — COLONOSCOPY WITH PROPOFOL
Anesthesia: General

## 2017-07-22 MED ORDER — MIDAZOLAM HCL 2 MG/2ML IJ SOLN
INTRAMUSCULAR | Status: DC | PRN
  Administered 2017-07-22: 2 mg via INTRAVENOUS

## 2017-07-22 MED ORDER — SODIUM CHLORIDE 0.9 % IV SOLN
INTRAVENOUS | Status: DC
  Administered 2017-07-22: 11:00:00 via INTRAVENOUS

## 2017-07-22 MED ORDER — PROPOFOL 500 MG/50ML IV EMUL
INTRAVENOUS | Status: DC | PRN
  Administered 2017-07-22: 150 ug/kg/min via INTRAVENOUS

## 2017-07-22 MED ORDER — MIDAZOLAM HCL 2 MG/2ML IJ SOLN
INTRAMUSCULAR | Status: AC
  Filled 2017-07-22: qty 2

## 2017-07-22 MED ORDER — PROPOFOL 10 MG/ML IV BOLUS
INTRAVENOUS | Status: DC | PRN
  Administered 2017-07-22: 10 mg via INTRAVENOUS
  Administered 2017-07-22: 90 mg via INTRAVENOUS

## 2017-07-22 MED ORDER — PHENYLEPHRINE HCL 10 MG/ML IJ SOLN
INTRAMUSCULAR | Status: DC | PRN
  Administered 2017-07-22: 100 ug via INTRAVENOUS

## 2017-07-22 MED ORDER — LIDOCAINE HCL (CARDIAC) PF 100 MG/5ML IV SOSY
PREFILLED_SYRINGE | INTRAVENOUS | Status: DC | PRN
  Administered 2017-07-22: 40 mg via INTRAVENOUS

## 2017-07-22 MED ORDER — PROPOFOL 500 MG/50ML IV EMUL
INTRAVENOUS | Status: AC
  Filled 2017-07-22: qty 50

## 2017-07-22 NOTE — Anesthesia Preprocedure Evaluation (Addendum)
Anesthesia Evaluation  Patient identified by MRN, date of birth, ID band Patient awake    Reviewed: Allergy & Precautions, H&P , NPO status , Patient's Chart, lab work & pertinent test results, reviewed documented beta blocker date and time   History of Anesthesia Complications Negative for: history of anesthetic complications  Airway Mallampati: I  TM Distance: >3 FB Neck ROM: full    Dental  (+) Dental Advidsory Given, Partial Upper, Teeth Intact   Pulmonary neg pulmonary ROS,    Pulmonary exam normal        Cardiovascular Exercise Tolerance: Good hypertension, (-) angina+ Peripheral Vascular Disease  (-) CAD, (-) Past MI, (-) Cardiac Stents and (-) CABG (-) dysrhythmias (-) Valvular Problems/Murmurs     Neuro/Psych neg Seizures PSYCHIATRIC DISORDERS Anxiety Depression  Neuromuscular disease (neuropathy) negative neurological ROS     GI/Hepatic negative GI ROS, Neg liver ROS,   Endo/Other  diabetes  Renal/GU negative Renal ROS  negative genitourinary   Musculoskeletal   Abdominal   Peds  Hematology negative hematology ROS (+)   Anesthesia Other Findings Past Medical History: No date: Anxiety and depression No date: Atherosclerosis of aorta (HCC) No date: CTS (carpal tunnel syndrome) No date: Depressive disorder No date: Diabetes mellitus without complication (HCC) No date: Diabetic neuropathy (HCC) No date: Hyperactivity of bladder No date: Hypertension No date: Insomnia No date: Lumbar sprain No date: Ovarian failure No date: Rhinitis No date: Vaginitis and vulvovaginitis   Reproductive/Obstetrics negative OB ROS                            Anesthesia Physical Anesthesia Plan  ASA: II  Anesthesia Plan: General   Post-op Pain Management:    Induction: Intravenous  PONV Risk Score and Plan: 3 and Propofol infusion  Airway Management Planned: Nasal Cannula  Additional  Equipment:   Intra-op Plan:   Post-operative Plan:   Informed Consent: I have reviewed the patients History and Physical, chart, labs and discussed the procedure including the risks, benefits and alternatives for the proposed anesthesia with the patient or authorized representative who has indicated his/her understanding and acceptance.   Dental Advisory Given  Plan Discussed with: Anesthesiologist, CRNA and Surgeon  Anesthesia Plan Comments:         Anesthesia Quick Evaluation

## 2017-07-22 NOTE — H&P (Signed)
Tolerated prep well. Regular bowel movements with the use of Linzess. For screening colonoscopy. No interval change in history or exam.

## 2017-07-22 NOTE — Anesthesia Post-op Follow-up Note (Signed)
Anesthesia QCDR form completed.        

## 2017-07-22 NOTE — Anesthesia Postprocedure Evaluation (Signed)
Anesthesia Post Note  Patient: Allea N Moseman  Procedure(s) Performed: COLONOSCOPY WITH PROPOFOL (N/A )  Patient location during evaluation: Endoscopy Anesthesia Type: General Level of consciousness: awake and alert Pain management: pain level controlled Vital Signs Assessment: post-procedure vital signs reviewed and stable Respiratory status: spontaneous breathing, nonlabored ventilation, respiratory function stable and patient connected to nasal cannula oxygen Cardiovascular status: blood pressure returned to baseline and stable Postop Assessment: no apparent nausea or vomiting Anesthetic complications: no     Last Vitals:  Vitals:   07/22/17 1034 07/22/17 1145  BP: 139/77 108/63  Pulse: 68   Resp: 16 18  Temp: 36.8 C (!) 36.1 C  SpO2: 100% 100%    Last Pain:  Vitals:   07/22/17 1145  TempSrc: Tympanic  PainSc: 0-No pain                 Cleda Mccreedy Piscitello

## 2017-07-22 NOTE — Transfer of Care (Signed)
Immediate Anesthesia Transfer of Care Note  Patient: Laurie Daniels  Procedure(s) Performed: Procedure(s): COLONOSCOPY WITH PROPOFOL (N/A)  Patient Location: PACU and Endoscopy Unit  Anesthesia Type:General  Level of Consciousness: sedated  Airway & Oxygen Therapy: Patient Spontanous Breathing and Patient connected to nasal cannula oxygen  Post-op Assessment: Report given to RN and Post -op Vital signs reviewed and stable  Post vital signs: Reviewed and stable  Last Vitals:  Vitals:   07/22/17 1034 07/22/17 1145  BP: 139/77 108/63  Pulse: 68   Resp: 16 18  Temp: 36.8 C (!) 36.1 C  SpO2: 100% 100%    Complications: No apparent anesthesia complications

## 2017-07-22 NOTE — Op Note (Signed)
[facility]astroenterology Patient Name: Laurie Daniels Procedure Date: 07/22/2017 10:59 AM MRN: [id number] Account #: [id number] Date of Birth: Jul 28, 1955 Admit Type: Outpatient Age: 62 Room: [facility] ENDO ROOM 1 Gender: Female Note Status: Finalized Procedure:            Colonoscopy Indications:          Screening for colorectal malignant neoplasm Providers:            Earline Mayotte, MD Referring MD:         Charisse March (Referring MD) Medicines:            Monitored Anesthesia Care Complications:        No immediate complications. Procedure:            Pre-Anesthesia Assessment:                       - Prior to the procedure, a History and Physical was                        performed, and patient medications, allergies and                        sensitivities were reviewed. The patient's tolerance of                        previous anesthesia was reviewed.                       - The risks and benefits of the procedure and the                        sedation options and risks were discussed with the                        patient. All questions were answered and informed                        consent was obtained.                       After obtaining informed consent, the colonoscope was                        passed under direct vision. Throughout the procedure,                        the patient's blood pressure, pulse, and oxygen                        saturations were monitored continuously. The                        Colonoscope was introduced through the anus and                        advanced to the the cecum, identified by appendiceal                        orifice and ileocecal valve. The colonoscopy was  somewhat difficult due to significant looping. The                        patient tolerated the procedure well. The quality of                        the bowel preparation was adequate to identify polyps 6              mm and larger in size. Findings:      The entire examined colon appeared normal on direct and retroflexion       views. Impression:           - The entire examined colon is normal on direct and                        retroflexion views.                       - No specimens collected. Recommendation:       - Repeat colonoscopy in 10 years for screening purposes. Procedure Code(s):    --- Professional ---                       [phone], Colonoscopy, flexible; diagnostic, including                        collection of specimen(s) by brushing or washing, when                        performed (separate procedure) Diagnosis Code(s):    --- Professional ---                       Z12.11, Encounter for screening for malignant neoplasm                        of colon CPT copyright 2017 American Medical Association. All rights reserved. The codes documented in this report are preliminary and upon coder review may  be revised to meet current compliance requirements. Earline Mayotte, MD 07/22/2017 11:43:22 AM This report has been signed electronically. Number of Addenda: 0 Note Initiated On: 07/22/2017 10:59 AM Scope Withdrawal Time: 0 hours 8 minutes 24 seconds  Total Procedure Duration: 0 hours 25 minutes 9 seconds       [facility]

## 2017-07-22 NOTE — Anesthesia Procedure Notes (Signed)
Date/Time: 07/22/2017 11:05 AM Performed by: Stormy Fabian, CRNA Pre-anesthesia Checklist: Patient identified, Emergency Drugs available, Suction available and Patient being monitored Patient Re-evaluated:Patient Re-evaluated prior to induction Oxygen Delivery Method: Nasal cannula Induction Type: IV induction Dental Injury: Teeth and Oropharynx as per pre-operative assessment  Comments: Nasal cannula with etCO2 monitoring

## 2017-07-24 ENCOUNTER — Encounter: Payer: Self-pay | Admitting: General Surgery

## 2017-08-31 HISTORY — PX: CATARACT EXTRACTION: SUR2

## 2017-09-21 ENCOUNTER — Ambulatory Visit: Payer: BLUE CROSS/BLUE SHIELD | Admitting: Nurse Practitioner

## 2017-09-22 ENCOUNTER — Ambulatory Visit: Payer: BLUE CROSS/BLUE SHIELD | Admitting: Family Medicine

## 2017-09-22 ENCOUNTER — Encounter: Payer: Self-pay | Admitting: Family Medicine

## 2017-09-22 VITALS — BP 110/68 | HR 68 | Temp 98.5°F | Resp 16 | Ht 63.0 in | Wt 147.7 lb

## 2017-09-22 DIAGNOSIS — E782 Mixed hyperlipidemia: Secondary | ICD-10-CM

## 2017-09-22 DIAGNOSIS — E041 Nontoxic single thyroid nodule: Secondary | ICD-10-CM

## 2017-09-22 DIAGNOSIS — I700 Atherosclerosis of aorta: Secondary | ICD-10-CM | POA: Diagnosis not present

## 2017-09-22 DIAGNOSIS — G4700 Insomnia, unspecified: Secondary | ICD-10-CM | POA: Diagnosis not present

## 2017-09-22 DIAGNOSIS — E7800 Pure hypercholesterolemia, unspecified: Secondary | ICD-10-CM

## 2017-09-22 DIAGNOSIS — F411 Generalized anxiety disorder: Secondary | ICD-10-CM

## 2017-09-22 DIAGNOSIS — E119 Type 2 diabetes mellitus without complications: Secondary | ICD-10-CM | POA: Diagnosis not present

## 2017-09-22 DIAGNOSIS — K5909 Other constipation: Secondary | ICD-10-CM

## 2017-09-22 DIAGNOSIS — F329 Major depressive disorder, single episode, unspecified: Secondary | ICD-10-CM

## 2017-09-22 DIAGNOSIS — G43009 Migraine without aura, not intractable, without status migrainosus: Secondary | ICD-10-CM

## 2017-09-22 MED ORDER — ATORVASTATIN CALCIUM 40 MG PO TABS: 40.0000 mg | ORAL_TABLET | Freq: Every day | ORAL | 1 refills | Status: DC

## 2017-09-22 MED ORDER — MIRTAZAPINE 15 MG PO TABS: 15.0000 mg | ORAL_TABLET | Freq: Every day | ORAL | 1 refills | Status: DC

## 2017-09-22 MED ORDER — ESCITALOPRAM OXALATE 10 MG PO TABS: 10.0000 mg | ORAL_TABLET | Freq: Every day | ORAL | 0 refills | Status: DC

## 2017-09-22 NOTE — Patient Instructions (Addendum)
Escitalopram (Lexapro) - Take 1/2 tablet daily for 3 days, then take 1 tablet daily.   Diabetes Mellitus and Nutrition When you have diabetes (diabetes mellitus), it is very important to have healthy eating habits because your blood sugar (glucose) levels are greatly affected by what you eat and drink. Eating healthy foods in the appropriate amounts, at about the same times every day, can help you:  Control your blood glucose.  Lower your risk of heart disease.  Improve your blood pressure.  Reach or maintain a healthy weight.  Every person with diabetes is different, and each person has different needs for a meal plan. Your health care provider may recommend that you work with a diet and nutrition specialist (dietitian) to make a meal plan that is best for you. Your meal plan may vary depending on factors such as:  The calories you need.  The medicines you take.  Your weight.  Your blood glucose, blood pressure, and cholesterol levels.  Your activity level.  Other health conditions you have, such as heart or kidney disease.  How do carbohydrates affect me? Carbohydrates affect your blood glucose level more than any other type of food. Eating carbohydrates naturally increases the amount of glucose in your blood. Carbohydrate counting is a method for keeping track of how many carbohydrates you eat. Counting carbohydrates is important to keep your blood glucose at a healthy level, especially if you use insulin or take certain oral diabetes medicines. It is important to know how many carbohydrates you can safely have in each meal. This is different for every person. Your dietitian can help you calculate how many carbohydrates you should have at each meal and for snack. Foods that contain carbohydrates include:  Bread, cereal, rice, pasta, and crackers.  Potatoes and corn.  Peas, beans, and lentils.  Milk and yogurt.  Fruit and juice.  Desserts, such as cakes, cookies, ice  cream, and candy.  How does alcohol affect me? Alcohol can cause a sudden decrease in blood glucose (hypoglycemia), especially if you use insulin or take certain oral diabetes medicines. Hypoglycemia can be a life-threatening condition. Symptoms of hypoglycemia (sleepiness, dizziness, and confusion) are similar to symptoms of having too much alcohol. If your health care provider says that alcohol is safe for you, follow these guidelines:  Limit alcohol intake to no more than 1 drink per day for nonpregnant women and 2 drinks per day for men. One drink equals 12 oz of beer, 5 oz of wine, or 1 oz of hard liquor.  Do not drink on an empty stomach.  Keep yourself hydrated with water, diet soda, or unsweetened iced tea.  Keep in mind that regular soda, juice, and other mixers may contain a lot of sugar and must be counted as carbohydrates.  What are tips for following this plan? Reading food labels  Start by checking the serving size on the label. The amount of calories, carbohydrates, fats, and other nutrients listed on the label are based on one serving of the food. Many foods contain more than one serving per package.  Check the total grams (g) of carbohydrates in one serving. You can calculate the number of servings of carbohydrates in one serving by dividing the total carbohydrates by 15. For example, if a food has 30 g of total carbohydrates, it would be equal to 2 servings of carbohydrates.  Check the number of grams (g) of saturated and trans fats in one serving. Choose foods that have low or no amount  of these fats.  Check the number of milligrams (mg) of sodium in one serving. Most people should limit total sodium intake to less than 2,300 mg per day.  Always check the nutrition information of foods labeled as "low-fat" or "nonfat". These foods may be higher in added sugar or refined carbohydrates and should be avoided.  Talk to your dietitian to identify your daily goals for  nutrients listed on the label. Shopping  Avoid buying canned, premade, or processed foods. These foods tend to be high in fat, sodium, and added sugar.  Shop around the outside edge of the grocery store. This includes fresh fruits and vegetables, bulk grains, fresh meats, and fresh dairy. Cooking  Use low-heat cooking methods, such as baking, instead of high-heat cooking methods like deep frying.  Cook using healthy oils, such as olive, canola, or sunflower oil.  Avoid cooking with butter, cream, or high-fat meats. Meal planning  Eat meals and snacks regularly, preferably at the same times every day. Avoid going long periods of time without eating.  Eat foods high in fiber, such as fresh fruits, vegetables, beans, and whole grains. Talk to your dietitian about how many servings of carbohydrates you can eat at each meal.  Eat 4-6 ounces of lean protein each day, such as lean meat, chicken, fish, eggs, or tofu. 1 ounce is equal to 1 ounce of meat, chicken, or fish, 1 egg, or 1/4 cup of tofu.  Eat some foods each day that contain healthy fats, such as avocado, nuts, seeds, and fish. Lifestyle   Check your blood glucose regularly.  Exercise at least 30 minutes 5 or more days each week, or as told by your health care provider.  Take medicines as told by your health care provider.  Do not use any products that contain nicotine or tobacco, such as cigarettes and e-cigarettes. If you need help quitting, ask your health care provider.  Work with a Veterinary surgeoncounselor or diabetes educator to identify strategies to manage stress and any emotional and social challenges. What are some questions to ask my health care provider?  Do I need to meet with a diabetes educator?  Do I need to meet with a dietitian?  What number can I call if I have questions?  When are the best times to check my blood glucose? Where to find more information:  American Diabetes Association:  diabetes.org/food-and-fitness/food  Academy of Nutrition and Dietetics: https://www.vargas.com/www.eatright.org/resources/health/diseases-and-conditions/diabetes  [employer] of Diabetes and Digestive and Kidney Diseases (NIH): FindJewelers.czwww.niddk.nih.gov/health-information/diabetes/overview/diet-eating-physical-activity Summary  A healthy meal plan will help you control your blood glucose and maintain a healthy lifestyle.  Working with a diet and nutrition specialist (dietitian) can help you make a meal plan that is best for you.  Keep in mind that carbohydrates and alcohol have immediate effects on your blood glucose levels. It is important to count carbohydrates and to use alcohol carefully. This information is not intended to replace advice given to you by your health care provider. Make sure you discuss any questions you have with your health care provider. Document Released: 11/14/2004 Document Revised: 03/24/2016 Document Reviewed: 03/24/2016 Elsevier Interactive Patient Education  [employer]2018 [employer]
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
## 2017-09-23 ENCOUNTER — Other Ambulatory Visit: Payer: Self-pay | Admitting: Family Medicine

## 2017-09-23 DIAGNOSIS — E119 Type 2 diabetes mellitus without complications: Secondary | ICD-10-CM

## 2017-09-23 LAB — COMPLETE METABOLIC PANEL WITH GFR
AG Ratio: 1.7 (calc) (ref 1.0–2.5)
ALBUMIN MSPROF: 4.5 g/dL (ref 3.6–5.1)
ALKALINE PHOSPHATASE (APISO): 105 U/L (ref 33–130)
ALT: 12 U/L (ref 6–29)
AST: 13 U/L (ref 10–35)
BUN: 24 mg/dL (ref 7–25)
CHLORIDE: 104 mmol/L (ref 98–110)
CO2: 28 mmol/L (ref 20–32)
CREATININE: 0.74 mg/dL (ref 0.50–0.99)
Calcium: 9.5 mg/dL (ref 8.6–10.4)
GFR, Est African American: 100 mL/min/{1.73_m2} (ref >=60)
GFR, Est Non African American: 86 mL/min/{1.73_m2} (ref >=60)
GLOBULIN: 2.6 g/dL (ref 1.9–3.7)
Glucose, Bld: 101 mg/dL (ref 65–139)
POTASSIUM: 4.1 mmol/L (ref 3.5–5.3)
SODIUM: 139 mmol/L (ref 135–146)
TOTAL PROTEIN: 7.1 g/dL (ref 6.1–8.1)
Total Bilirubin: 0.5 mg/dL (ref 0.2–1.2)

## 2017-09-23 LAB — HEMOGLOBIN A1C
EAG (MMOL/L): 8.4 (calc)
Hgb A1c MFr Bld: 6.9 % of total Hgb — ABNORMAL HIGH (ref <5.7)
Mean Plasma Glucose: 151 (calc)

## 2017-09-23 LAB — LIPID PANEL
CHOLESTEROL: 225 mg/dL — AB (ref <200)
HDL: 64 mg/dL (ref >50)
LDL Cholesterol (Calc): 141 mg/dL (calc) — ABNORMAL HIGH
Non-HDL Cholesterol (Calc): 161 mg/dL (calc) — ABNORMAL HIGH (ref <130)
Total CHOL/HDL Ratio: 3.5 (calc) (ref <5.0)
Triglycerides: 96 mg/dL (ref <150)

## 2017-09-23 LAB — TSH: TSH: 0.74 mIU/L (ref 0.40–4.50)

## 2017-09-23 MED ORDER — EMPAGLIFLOZIN 10 MG PO TABS: 10.0000 mg | ORAL_TABLET | ORAL | 0 refills | Status: DC

## 2017-09-24 ENCOUNTER — Ambulatory Visit: Payer: BLUE CROSS/BLUE SHIELD | Admitting: Family Medicine

## 2017-10-27 ENCOUNTER — Telehealth: Payer: Self-pay | Admitting: Emergency Medicine

## 2017-10-27 DIAGNOSIS — E1129 Type 2 diabetes mellitus with other diabetic kidney complication: Secondary | ICD-10-CM

## 2017-10-27 DIAGNOSIS — R809 Proteinuria, unspecified: Secondary | ICD-10-CM

## 2017-10-27 NOTE — Telephone Encounter (Signed)
90-day supply was sent 09/23/2017 - she should have enough to get her through until Dr. Carlynn PurlSowles' visit.  Let me know if there was an issue at the pharmacy.

## 2017-10-27 NOTE — Telephone Encounter (Signed)
Need refill on Jardiance sent topharmacy.

## 2017-10-28 MED ORDER — LOSARTAN POTASSIUM 25 MG PO TABS: 25.0000 mg | ORAL_TABLET | Freq: Every day | ORAL | 1 refills | Status: DC

## 2017-10-28 NOTE — Telephone Encounter (Signed)
Sent!

## 2017-10-28 NOTE — Telephone Encounter (Signed)
IT was not Jardiance. It was for Losartan

## 2017-10-28 NOTE — Addendum Note (Signed)
Addended by: Maurice SmallBOYCE, EMILY E on: 10/28/2017 11:13 AM   Modules accepted: Orders

## 2017-12-20 ENCOUNTER — Other Ambulatory Visit: Payer: Self-pay | Admitting: Family Medicine

## 2017-12-20 DIAGNOSIS — E119 Type 2 diabetes mellitus without complications: Secondary | ICD-10-CM

## 2017-12-31 ENCOUNTER — Encounter: Payer: Self-pay | Admitting: Family Medicine

## 2017-12-31 ENCOUNTER — Ambulatory Visit: Payer: BLUE CROSS/BLUE SHIELD | Admitting: Family Medicine

## 2017-12-31 VITALS — BP 130/84 | HR 102 | Temp 98.1°F | Resp 16 | Ht 63.0 in | Wt 142.9 lb

## 2017-12-31 DIAGNOSIS — K5909 Other constipation: Secondary | ICD-10-CM

## 2017-12-31 DIAGNOSIS — F330 Major depressive disorder, recurrent, mild: Secondary | ICD-10-CM | POA: Diagnosis not present

## 2017-12-31 DIAGNOSIS — Z79899 Other long term (current) drug therapy: Secondary | ICD-10-CM

## 2017-12-31 DIAGNOSIS — I700 Atherosclerosis of aorta: Secondary | ICD-10-CM

## 2017-12-31 DIAGNOSIS — E1129 Type 2 diabetes mellitus with other diabetic kidney complication: Secondary | ICD-10-CM

## 2017-12-31 DIAGNOSIS — Z23 Encounter for immunization: Secondary | ICD-10-CM

## 2017-12-31 DIAGNOSIS — E119 Type 2 diabetes mellitus without complications: Secondary | ICD-10-CM

## 2017-12-31 DIAGNOSIS — F5101 Primary insomnia: Secondary | ICD-10-CM

## 2017-12-31 DIAGNOSIS — F411 Generalized anxiety disorder: Secondary | ICD-10-CM | POA: Diagnosis not present

## 2017-12-31 DIAGNOSIS — E7800 Pure hypercholesterolemia, unspecified: Secondary | ICD-10-CM

## 2017-12-31 DIAGNOSIS — E538 Deficiency of other specified B group vitamins: Secondary | ICD-10-CM

## 2017-12-31 DIAGNOSIS — R809 Proteinuria, unspecified: Secondary | ICD-10-CM

## 2017-12-31 DIAGNOSIS — M19049 Primary osteoarthritis, unspecified hand: Secondary | ICD-10-CM

## 2017-12-31 DIAGNOSIS — M25649 Stiffness of unspecified hand, not elsewhere classified: Secondary | ICD-10-CM

## 2017-12-31 LAB — POCT GLYCOSYLATED HEMOGLOBIN (HGB A1C): HEMOGLOBIN A1C: 6.6 % — AB (ref 4.0–5.6)

## 2017-12-31 MED ORDER — MELOXICAM 7.5 MG PO TABS: 7.5000 mg | ORAL_TABLET | Freq: Two times a day (BID) | ORAL | 0 refills | Status: DC | PRN

## 2017-12-31 MED ORDER — SITAGLIPTIN PHOSPHATE 100 MG PO TABS: 100.0000 mg | ORAL_TABLET | Freq: Every day | ORAL | 1 refills | Status: DC

## 2017-12-31 MED ORDER — ESCITALOPRAM OXALATE 10 MG PO TABS: 10.0000 mg | ORAL_TABLET | Freq: Every day | ORAL | 0 refills | Status: DC

## 2017-12-31 MED ORDER — CYANOCOBALAMIN 1000 MCG/ML IJ SOLN
1000.0000 ug | Freq: Once | INTRAMUSCULAR | Status: AC
  Administered 2019-02-01: 1000 ug via INTRAMUSCULAR
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
## 2018-01-04 ENCOUNTER — Telehealth: Payer: Self-pay

## 2018-01-04 LAB — LIPID PANEL
Cholesterol: 185 mg/dL (ref <200)
HDL: 61 mg/dL (ref >50)
LDL CHOLESTEROL (CALC): 106 mg/dL — AB
NON-HDL CHOLESTEROL (CALC): 124 mg/dL (ref <130)
TRIGLYCERIDES: 88 mg/dL (ref <150)
Total CHOL/HDL Ratio: 3 (calc) (ref <5.0)

## 2018-01-04 LAB — ANA,IFA RA DIAG PNL W/RFLX TIT/PATN
Anti Nuclear Antibody(ANA): NEGATIVE
Cyclic Citrullin Peptide Ab: <16 UNITS
Rhuematoid fact SerPl-aCnc: <14 IU/mL (ref <14)

## 2018-01-04 LAB — C-REACTIVE PROTEIN: CRP: 1.2 mg/L (ref <8.0)

## 2018-01-04 LAB — MICROALBUMIN / CREATININE URINE RATIO
CREATININE, URINE: 135 mg/dL (ref 20–275)
MICROALB UR: 3.2 mg/dL
Microalb Creat Ratio: 24 mcg/mg creat (ref <30)

## 2018-01-04 LAB — SEDIMENTATION RATE: SED RATE: 14 mm/h (ref 0–30)

## 2018-01-04 NOTE — Telephone Encounter (Signed)
Patient went to pick up her medication but was told that it would cost her $70. Is there anything else she can take that is cheaper?

## 2018-01-04 NOTE — Telephone Encounter (Signed)
Coupon card has been printed and given to her sister Myriam Jacobson) to see if it will help with the cost.

## 2018-01-04 NOTE — Telephone Encounter (Signed)
What rx? Januvia? Do they have a voucher? Explain to her about the new GLP1 agonist. I know she does not want to lose weight, but it will be $10 with private insurance and it is a pill

## 2018-03-22 ENCOUNTER — Telehealth: Payer: Self-pay

## 2018-03-22 NOTE — Telephone Encounter (Signed)
Can she tolerate Metformin ER 750 two daily?

## 2018-03-22 NOTE — Telephone Encounter (Signed)
Insurance will no longer pay for Januvia. It is now $300. Need something else called in

## 2018-03-23 ENCOUNTER — Other Ambulatory Visit: Payer: Self-pay | Admitting: Family Medicine

## 2018-03-23 DIAGNOSIS — Z1231 Encounter for screening mammogram for malignant neoplasm of breast: Secondary | ICD-10-CM

## 2018-03-23 MED ORDER — METFORMIN HCL ER 500 MG PO TB24: 500.0000 mg | ORAL_TABLET | Freq: Two times a day (BID) | ORAL | 0 refills | Status: DC

## 2018-03-23 NOTE — Telephone Encounter (Signed)
Patient states Metformin messed up her stomach in the past but is willing to try it again. Please send a 30 day supply to her pharmacy so she is able to test her toleration to it.

## 2018-03-23 NOTE — Telephone Encounter (Signed)
Sent lower dose to pharmacy 500 mg ER to take BID and see if she tolerates

## 2018-03-24 ENCOUNTER — Other Ambulatory Visit: Payer: Self-pay | Admitting: Family Medicine

## 2018-03-24 DIAGNOSIS — F411 Generalized anxiety disorder: Secondary | ICD-10-CM

## 2018-03-24 DIAGNOSIS — F330 Major depressive disorder, recurrent, mild: Secondary | ICD-10-CM

## 2018-03-25 ENCOUNTER — Other Ambulatory Visit: Payer: Self-pay | Admitting: Family Medicine

## 2018-03-25 DIAGNOSIS — E7800 Pure hypercholesterolemia, unspecified: Secondary | ICD-10-CM

## 2018-04-07 ENCOUNTER — Other Ambulatory Visit: Payer: Self-pay

## 2018-04-07 DIAGNOSIS — E7800 Pure hypercholesterolemia, unspecified: Secondary | ICD-10-CM

## 2018-04-07 MED ORDER — ATORVASTATIN CALCIUM 40 MG PO TABS: 40.0000 mg | ORAL_TABLET | Freq: Every day | ORAL | 0 refills | Status: DC

## 2018-04-07 NOTE — Telephone Encounter (Signed)
Refill Request for Cholesterol medication. [facility] [place].   Last visit: 12/31/2017   Lab Results  Component Value Date   CHOL 185 12/31/2017   HDL 61 12/31/2017   LDLCALC 106 (H) 12/31/2017   TRIG 88 12/31/2017   CHOLHDL 3.0 12/31/2017    Follow up on 04/12/2018

## 2018-04-09 ENCOUNTER — Other Ambulatory Visit: Payer: Self-pay

## 2018-04-12 ENCOUNTER — Ambulatory Visit: Payer: BLUE CROSS/BLUE SHIELD | Admitting: Family Medicine

## 2018-04-15 ENCOUNTER — Ambulatory Visit
Admission: RE | Admit: 2018-04-15 | Discharge: 2018-04-15 | Disposition: A | Discharge status: Home / Self Care | Payer: BLUE CROSS/BLUE SHIELD | Source: Ambulatory Visit | Attending: Family Medicine | Admitting: Family Medicine

## 2018-04-15 DIAGNOSIS — Z1231 Encounter for screening mammogram for malignant neoplasm of breast: Secondary | ICD-10-CM | POA: Insufficient documentation

## 2018-04-18 ENCOUNTER — Telehealth: Payer: Self-pay | Admitting: Family Medicine

## 2018-04-22 NOTE — Telephone Encounter (Signed)
Patient would like a return call from Dr. Carlynn Purl assistant concerning her diabetes medications.

## 2018-04-27 ENCOUNTER — Other Ambulatory Visit: Payer: Self-pay

## 2018-04-27 DIAGNOSIS — E119 Type 2 diabetes mellitus without complications: Secondary | ICD-10-CM

## 2018-04-27 MED ORDER — SITAGLIPTIN PHOSPHATE 100 MG PO TABS: 100.0000 mg | ORAL_TABLET | Freq: Every day | ORAL | 1 refills | Status: DC

## 2018-04-27 NOTE — Addendum Note (Signed)
Addended by: Tommie Raymond on: 04/27/2018 02:31 PM   Modules accepted: Orders

## 2018-04-27 NOTE — Telephone Encounter (Signed)
Patient would like to start back on Januvia 100 mg daily. Medication was stopped because of cost. She now knows that she can get medication from [facility] for cheaper. She would like it sent in to [facility].

## 2018-05-01 ENCOUNTER — Other Ambulatory Visit: Payer: Self-pay | Admitting: Family Medicine

## 2018-05-01 DIAGNOSIS — E1129 Type 2 diabetes mellitus with other diabetic kidney complication: Secondary | ICD-10-CM

## 2018-05-01 DIAGNOSIS — R809 Proteinuria, unspecified: Secondary | ICD-10-CM

## 2018-05-19 ENCOUNTER — Encounter: Payer: Self-pay | Admitting: Family Medicine

## 2018-05-19 ENCOUNTER — Other Ambulatory Visit: Payer: Self-pay

## 2018-05-19 ENCOUNTER — Other Ambulatory Visit (HOSPITAL_COMMUNITY)
Admission: RE | Admit: 2018-05-19 | Discharge: 2018-05-19 | Disposition: A | Discharge status: Home / Self Care | Payer: BLUE CROSS/BLUE SHIELD | Source: Ambulatory Visit | Attending: Family Medicine | Admitting: Family Medicine

## 2018-05-19 ENCOUNTER — Ambulatory Visit: Payer: BLUE CROSS/BLUE SHIELD | Admitting: Family Medicine

## 2018-05-19 VITALS — BP 114/68 | HR 71 | Temp 98.1°F | Resp 16 | Ht 63.0 in | Wt 141.1 lb

## 2018-05-19 DIAGNOSIS — E119 Type 2 diabetes mellitus without complications: Secondary | ICD-10-CM | POA: Diagnosis not present

## 2018-05-19 DIAGNOSIS — E7800 Pure hypercholesterolemia, unspecified: Secondary | ICD-10-CM

## 2018-05-19 DIAGNOSIS — Z124 Encounter for screening for malignant neoplasm of cervix: Secondary | ICD-10-CM

## 2018-05-19 DIAGNOSIS — F330 Major depressive disorder, recurrent, mild: Secondary | ICD-10-CM

## 2018-05-19 DIAGNOSIS — E1129 Type 2 diabetes mellitus with other diabetic kidney complication: Secondary | ICD-10-CM

## 2018-05-19 DIAGNOSIS — Z113 Encounter for screening for infections with a predominantly sexual mode of transmission: Secondary | ICD-10-CM | POA: Insufficient documentation

## 2018-05-19 DIAGNOSIS — Z01419 Encounter for gynecological examination (general) (routine) without abnormal findings: Secondary | ICD-10-CM | POA: Insufficient documentation

## 2018-05-19 DIAGNOSIS — F411 Generalized anxiety disorder: Secondary | ICD-10-CM

## 2018-05-19 DIAGNOSIS — E049 Nontoxic goiter, unspecified: Secondary | ICD-10-CM

## 2018-05-19 DIAGNOSIS — F5101 Primary insomnia: Secondary | ICD-10-CM

## 2018-05-19 DIAGNOSIS — G4700 Insomnia, unspecified: Secondary | ICD-10-CM

## 2018-05-19 DIAGNOSIS — I700 Atherosclerosis of aorta: Secondary | ICD-10-CM

## 2018-05-19 DIAGNOSIS — R809 Proteinuria, unspecified: Secondary | ICD-10-CM

## 2018-05-19 MED ORDER — MIRTAZAPINE 30 MG PO TABS: 30.0000 mg | ORAL_TABLET | Freq: Every day | ORAL | 1 refills | Status: DC

## 2018-05-19 MED ORDER — LOSARTAN POTASSIUM 25 MG PO TABS: 25.0000 mg | ORAL_TABLET | Freq: Every day | ORAL | 0 refills | Status: DC

## 2018-05-19 MED ORDER — ATORVASTATIN CALCIUM 40 MG PO TABS: 40.0000 mg | ORAL_TABLET | Freq: Every day | ORAL | 0 refills | Status: DC

## 2018-05-19 MED ORDER — ESCITALOPRAM OXALATE 10 MG PO TABS: 10.0000 mg | ORAL_TABLET | Freq: Every day | ORAL | 0 refills | Status: DC
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
## 2018-05-19 NOTE — Patient Instructions (Signed)
Ask insurance about shingrix coverage   Preventive Care 40-64 Years, Female Preventive care refers to lifestyle choices and visits with your health care provider that can promote health and wellness. What does preventive care include?   A yearly physical exam. This is also called an annual well check.  Dental exams once or twice a year.  Routine eye exams. Ask your health care provider how often you should have your eyes checked.  Personal lifestyle choices, including: ? Daily care of your teeth and gums. ? Regular physical activity. ? Eating a healthy diet. ? Avoiding tobacco and drug use. ? Limiting alcohol use. ? Practicing safe sex. ? Taking low-dose aspirin daily starting at age 58. ? Taking vitamin and mineral supplements as recommended by your health care provider. What happens during an annual well check? The services and screenings done by your health care provider during your annual well check will depend on your age, overall health, lifestyle risk factors, and family history of disease. Counseling Your health care provider may ask you questions about your:  Alcohol use.  Tobacco use.  Drug use.  Emotional well-being.  Home and relationship well-being.  Sexual activity.  Eating habits.  Work and work Statistician.  Method of birth control.  Menstrual cycle.  Pregnancy history. Screening You may have the following tests or measurements:  Height, weight, and BMI.  Blood pressure.  Lipid and cholesterol levels. These may be checked every 5 years, or more frequently if you are over 27 years old.  Skin check.  Lung cancer screening. You may have this screening every year starting at age 56 if you have a 30-pack-year history of smoking and currently smoke or have quit within the past 15 years.  Colorectal cancer screening. All adults should have this screening starting at age 36 and continuing until age 47. Your health care provider may recommend  screening at age 21. You will have tests every 1-10 years, depending on your results and the type of screening test. People at increased risk should start screening at an earlier age. Screening tests may include: ? Guaiac-based fecal occult blood testing. ? Fecal immunochemical test (FIT). ? Stool DNA test. ? Virtual colonoscopy. ? Sigmoidoscopy. During this test, a flexible tube with a tiny camera (sigmoidoscope) is used to examine your rectum and lower colon. The sigmoidoscope is inserted through your anus into your rectum and lower colon. ? Colonoscopy. During this test, a long, thin, flexible tube with a tiny camera (colonoscope) is used to examine your entire colon and rectum.  Hepatitis C blood test.  Hepatitis B blood test.  Sexually transmitted disease (STD) testing.  Diabetes screening. This is done by checking your blood sugar (glucose) after you have not eaten for a while (fasting). You may have this done every 1-3 years.  Mammogram. This may be done every 1-2 years. Talk to your health care provider about when you should start having regular mammograms. This may depend on whether you have a family history of breast cancer.  BRCA-related cancer screening. This may be done if you have a family history of breast, ovarian, tubal, or peritoneal cancers.  Pelvic exam and Pap test. This may be done every 3 years starting at age 80. Starting at age 96, this may be done every 5 years if you have a Pap test in combination with an HPV test.  Bone density scan. This is done to screen for osteoporosis. You may have this scan if you are at high risk for  osteoporosis. Discuss your test results, treatment options, and if necessary, the need for more tests with your health care provider. Vaccines Your health care provider may recommend certain vaccines, such as:  Influenza vaccine. This is recommended every year.  Tetanus, diphtheria, and acellular pertussis (Tdap, Td) vaccine. You may need a  Td booster every 10 years.  Varicella vaccine. You may need this if you have not been vaccinated.  Zoster vaccine. You may need this after age 12.  Measles, mumps, and rubella (MMR) vaccine. You may need at least one dose of MMR if you were born in 1957 or later. You may also need a second dose.  Pneumococcal 13-valent conjugate (PCV13) vaccine. You may need this if you have certain conditions and were not previously vaccinated.  Pneumococcal polysaccharide (PPSV23) vaccine. You may need one or two doses if you smoke cigarettes or if you have certain conditions.  Meningococcal vaccine. You may need this if you have certain conditions.  Hepatitis A vaccine. You may need this if you have certain conditions or if you travel or work in places where you may be exposed to hepatitis A.  Hepatitis B vaccine. You may need this if you have certain conditions or if you travel or work in places where you may be exposed to hepatitis B.  Haemophilus influenzae type b (Hib) vaccine. You may need this if you have certain conditions. Talk to your health care provider about which screenings and vaccines you need and how often you need them. This information is not intended to replace advice given to you by your health care provider. Make sure you discuss any questions you have with your health care provider. Document Released: 03/16/2015 Document Revised: 04/09/2017 Document Reviewed: 12/19/2014 Elsevier Interactive Patient Education  2019 [employer].

## 2018-05-21 LAB — COMPLETE METABOLIC PANEL WITH GFR
AG Ratio: 1.6 (calc) (ref 1.0–2.5)
ALT: 14 U/L (ref 6–29)
AST: 21 U/L (ref 10–35)
Albumin: 4.6 g/dL (ref 3.6–5.1)
Alkaline phosphatase (APISO): 98 U/L (ref 37–153)
BUN: 20 mg/dL (ref 7–25)
CO2: 24 mmol/L (ref 20–32)
Calcium: 10 mg/dL (ref 8.6–10.4)
Chloride: 106 mmol/L (ref 98–110)
Creat: 0.99 mg/dL (ref 0.50–0.99)
GFR, Est African American: 70 mL/min/{1.73_m2} (ref >=60)
GFR, Est Non African American: 60 mL/min/{1.73_m2} (ref >=60)
GLOBULIN: 2.9 g/dL (ref 1.9–3.7)
Glucose, Bld: 101 mg/dL — ABNORMAL HIGH (ref 65–99)
Potassium: 4.5 mmol/L (ref 3.5–5.3)
Sodium: 143 mmol/L (ref 135–146)
Total Bilirubin: 0.4 mg/dL (ref 0.2–1.2)
Total Protein: 7.5 g/dL (ref 6.1–8.1)

## 2018-05-21 LAB — CBC WITH DIFFERENTIAL/PLATELET
Absolute Monocytes: 399 cells/uL (ref 200–950)
Basophils Absolute: 38 cells/uL (ref 0–200)
Basophils Relative: 0.9 %
Eosinophils Absolute: 88 cells/uL (ref 15–500)
Eosinophils Relative: 2.1 %
HEMATOCRIT: 33.3 % — AB (ref 35.0–45.0)
Hemoglobin: 11.6 g/dL — ABNORMAL LOW (ref 11.7–15.5)
LYMPHS ABS: 1987 {cells}/uL (ref 850–3900)
MCH: 30.4 pg (ref 27.0–33.0)
MCHC: 34.8 g/dL (ref 32.0–36.0)
MCV: 87.4 fL (ref 80.0–100.0)
MPV: 9.8 fL (ref 7.5–12.5)
Monocytes Relative: 9.5 %
Neutro Abs: 1688 cells/uL (ref 1500–7800)
Neutrophils Relative %: 40.2 %
Platelets: 327 10*3/uL (ref 140–400)
RBC: 3.81 10*6/uL (ref 3.80–5.10)
RDW: 13.1 % (ref 11.0–15.0)
Total Lymphocyte: 47.3 %
WBC: 4.2 10*3/uL (ref 3.8–10.8)

## 2018-05-21 LAB — HEMOGLOBIN A1C
Hgb A1c MFr Bld: 6.4 % of total Hgb — ABNORMAL HIGH (ref <5.7)
Mean Plasma Glucose: 137 (calc)
eAG (mmol/L): 7.6 (calc)

## 2018-05-21 LAB — HEPATITIS PANEL, ACUTE
Hep A IgM: NONREACTIVE
Hep B C IgM: NONREACTIVE
Hepatitis B Surface Ag: NONREACTIVE
Hepatitis C Ab: NONREACTIVE
SIGNAL TO CUT-OFF: 0.02 (ref <1.00)

## 2018-05-21 LAB — LIPID PANEL
CHOLESTEROL: 147 mg/dL (ref <200)
HDL: 64 mg/dL (ref >=50)
LDL Cholesterol (Calc): 66 mg/dL (calc)
Non-HDL Cholesterol (Calc): 83 mg/dL (calc) (ref <130)
Total CHOL/HDL Ratio: 2.3 (calc) (ref <5.0)
Triglycerides: 88 mg/dL (ref <150)

## 2018-05-21 LAB — TSH: TSH: 0.4 mIU/L (ref 0.40–4.50)

## 2018-05-21 LAB — HIV ANTIBODY (ROUTINE TESTING W REFLEX): HIV: NONREACTIVE

## 2018-05-21 LAB — RPR: RPR: NONREACTIVE

## 2018-05-22 LAB — CYTOLOGY - PAP
Chlamydia: NEGATIVE
Diagnosis: NEGATIVE
HPV: NOT DETECTED
Neisseria Gonorrhea: NEGATIVE

## 2018-07-10 ENCOUNTER — Other Ambulatory Visit: Payer: Self-pay | Admitting: Family Medicine

## 2018-07-10 DIAGNOSIS — E7800 Pure hypercholesterolemia, unspecified: Secondary | ICD-10-CM

## 2018-07-27 LAB — HM DIABETES EYE EXAM

## 2018-08-05 ENCOUNTER — Other Ambulatory Visit: Payer: Self-pay | Admitting: Family Medicine

## 2018-08-05 DIAGNOSIS — E1129 Type 2 diabetes mellitus with other diabetic kidney complication: Secondary | ICD-10-CM

## 2018-08-05 DIAGNOSIS — R809 Proteinuria, unspecified: Secondary | ICD-10-CM

## 2018-09-06 ENCOUNTER — Other Ambulatory Visit: Payer: Self-pay

## 2018-09-06 ENCOUNTER — Encounter: Payer: Self-pay | Admitting: Family Medicine

## 2018-09-06 ENCOUNTER — Ambulatory Visit (INDEPENDENT_AMBULATORY_CARE_PROVIDER_SITE_OTHER): Payer: BC Managed Care – PPO | Admitting: Family Medicine

## 2018-09-06 VITALS — BP 140/80 | HR 73 | Temp 97.1°F | Resp 16 | Ht 63.0 in | Wt 147.0 lb

## 2018-09-06 DIAGNOSIS — R350 Frequency of micturition: Secondary | ICD-10-CM

## 2018-09-06 DIAGNOSIS — E7800 Pure hypercholesterolemia, unspecified: Secondary | ICD-10-CM

## 2018-09-06 DIAGNOSIS — K5909 Other constipation: Secondary | ICD-10-CM

## 2018-09-06 DIAGNOSIS — G43009 Migraine without aura, not intractable, without status migrainosus: Secondary | ICD-10-CM | POA: Diagnosis not present

## 2018-09-06 DIAGNOSIS — E119 Type 2 diabetes mellitus without complications: Secondary | ICD-10-CM | POA: Diagnosis not present

## 2018-09-06 DIAGNOSIS — E538 Deficiency of other specified B group vitamins: Secondary | ICD-10-CM

## 2018-09-06 DIAGNOSIS — F411 Generalized anxiety disorder: Secondary | ICD-10-CM

## 2018-09-06 DIAGNOSIS — R809 Proteinuria, unspecified: Secondary | ICD-10-CM

## 2018-09-06 DIAGNOSIS — I700 Atherosclerosis of aorta: Secondary | ICD-10-CM | POA: Diagnosis not present

## 2018-09-06 DIAGNOSIS — E1129 Type 2 diabetes mellitus with other diabetic kidney complication: Secondary | ICD-10-CM | POA: Diagnosis not present

## 2018-09-06 DIAGNOSIS — F330 Major depressive disorder, recurrent, mild: Secondary | ICD-10-CM

## 2018-09-06 LAB — POCT GLYCOSYLATED HEMOGLOBIN (HGB A1C): Hemoglobin A1C: 6.4 % — AB (ref 4.0–5.6)

## 2018-09-06 MED ORDER — LOSARTAN POTASSIUM 25 MG PO TABS: 25.0000 mg | ORAL_TABLET | Freq: Every day | ORAL | 0 refills | Status: DC

## 2018-09-06 MED ORDER — SITAGLIPTIN PHOSPHATE 100 MG PO TABS: 100.0000 mg | ORAL_TABLET | Freq: Every day | ORAL | 1 refills | Status: DC

## 2018-09-06 MED ORDER — ESCITALOPRAM OXALATE 10 MG PO TABS: 10.0000 mg | ORAL_TABLET | Freq: Every day | ORAL | 1 refills | Status: DC

## 2018-09-06 MED ORDER — NITROFURANTOIN MONOHYD MACRO 100 MG PO CAPS: 100.0000 mg | ORAL_CAPSULE | Freq: Two times a day (BID) | ORAL | 0 refills | Status: DC

## 2018-09-06 MED ORDER — ATORVASTATIN CALCIUM 40 MG PO TABS: 40.0000 mg | ORAL_TABLET | Freq: Every day | ORAL | 1 refills | Status: DC
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
## 2018-09-08 LAB — CULTURE, URINE COMPREHENSIVE
MICRO NUMBER:: 638927
SPECIMEN QUALITY:: ADEQUATE

## 2018-09-14 ENCOUNTER — Ambulatory Visit: Payer: Self-pay | Admitting: Family Medicine

## 2018-09-21 ENCOUNTER — Ambulatory Visit: Payer: BLUE CROSS/BLUE SHIELD | Admitting: Family Medicine

## 2018-10-23 ENCOUNTER — Other Ambulatory Visit: Payer: Self-pay | Admitting: Family Medicine

## 2018-10-23 DIAGNOSIS — G4700 Insomnia, unspecified: Secondary | ICD-10-CM

## 2018-10-23 NOTE — Telephone Encounter (Signed)
Requested Prescriptions  Pending Prescriptions Disp Refills  . mirtazapine (REMERON) 30 MG tablet [Pharmacy Med Name: MIRTAZAPINE TABS 30MG ] 90 tablet 3    Sig: TAKE 1 TABLET AT BEDTIME     Psychiatry: Antidepressants - mirtazapine Failed - 10/23/2018  1:05 AM      Failed - Completed PHQ-2 or PHQ-9 in the last 360 days.      Passed - AST in normal range and within 360 days    AST  Date Value Ref Range Status  05/19/2018 21 10 - 35 U/L Final   SGOT(AST)  Date Value Ref Range Status  01/04/2013 21 15 - 37 Unit/L Final         Passed - ALT in normal range and within 360 days    ALT  Date Value Ref Range Status  05/19/2018 14 6 - 29 U/L Final   SGPT (ALT)  Date Value Ref Range Status  01/04/2013 18 12 - 78 U/L Final         Passed - Triglycerides in normal range and within 360 days    Triglycerides  Date Value Ref Range Status  05/19/2018 88 <150 mg/dL Final  06/13/2011 100 0 - 200 mg/dL Final         Passed - Total Cholesterol in normal range and within 360 days    Cholesterol  Date Value Ref Range Status  05/19/2018 147 <200 mg/dL Final  06/13/2011 161 0 - 200 mg/dL Final         Passed - WBC in normal range and within 360 days    WBC  Date Value Ref Range Status  05/19/2018 4.2 3.8 - 10.8 Thousand/uL Final         Passed - Valid encounter within last 6 months    Recent Outpatient Visits          1 month ago Type II diabetes mellitus, well controlled [facility])   [facility] [place], Drue Stager, MD   5 months ago Type II diabetes mellitus, well controlled [facility]   [facility] [place], Drue Stager, MD   9 months ago Type II diabetes mellitus, well controlled [facility])   [facility] Steele Sizer, MD   1 year ago Type II diabetes mellitus, well controlled [facility])   [place], FNP   1 year ago Type II diabetes mellitus, well controlled [facility])   [facility] Steele Sizer, MD

## 2018-12-24 ENCOUNTER — Other Ambulatory Visit: Payer: Self-pay

## 2018-12-24 ENCOUNTER — Ambulatory Visit (INDEPENDENT_AMBULATORY_CARE_PROVIDER_SITE_OTHER): Payer: BC Managed Care – PPO | Admitting: Family Medicine

## 2018-12-24 ENCOUNTER — Encounter: Payer: Self-pay | Admitting: Family Medicine

## 2018-12-24 DIAGNOSIS — K068 Other specified disorders of gingiva and edentulous alveolar ridge: Secondary | ICD-10-CM

## 2018-12-24 MED ORDER — MAGIC MOUTHWASH W/LIDOCAINE: 5.0000 mL | Freq: Four times a day (QID) | ORAL | 0 refills | Status: DC | PRN

## 2018-12-24 MED ORDER — AMOXICILLIN-POT CLAVULANATE 875-125 MG PO TABS: 1.0000 | ORAL_TABLET | Freq: Two times a day (BID) | ORAL | 0 refills | Status: DC

## 2018-12-24 NOTE — Progress Notes (Signed)
Name: Laurie Daniels   MRN: [id number]    DOB: 05-18-1955   Date:12/24/2018       Progress Note  Subjective  Chief Complaint  Chief Complaint  Patient presents with  . Dental Pain    I connected with  Arlesia Kiel Mazurkiewicz on 12/24/18 at  1:00 PM EDT by telephone and verified that I am speaking with the correct person using two identifiers.   I discussed the limitations, risks, security and privacy concerns of performing an evaluation and management service by telephone and the availability of in person appointments. Staff also discussed with the patient that there may be a patient responsible charge related to this service. Patient Location: at home  Provider Location: [facility]   HPI  Gum problems: she wears dentures , and she noticed a sore spot on right upper side , like a growth, she states same color of her gum, it is painful when she applies pressure and has been unable to wear her dentures for the past 3 weeks. The are seems to be going down a little, but gets bigger again when she puts her dentures on. She states she called her dentist but couldn't be seen soon enough. She states saw pharmacist and was advised to take antibiotics.   Patient Active Problem List   Diagnosis Date Noted  . Chronic constipation 01/30/2016  . Thyroid nodule 08/19/2015  . Allergic rhinitis 08/13/2015  . Carpal tunnel syndrome 08/13/2015  . Atherosclerosis of aorta ([place]) 08/13/2015  . Nocturia 08/13/2015  . Migraine without aura and responsive to treatment 08/13/2015  . Thyroid goiter 08/13/2015  . Diabetes mellitus with microalbuminuria ([place]) 03/05/2015  . Insomnia 10/12/2014  . Hyperlipidemia 10/12/2014    Social History   Tobacco Use  . Smoking status: Never Smoker  . Smokeless tobacco: Never Used  Substance Use Topics  . Alcohol use: No    Alcohol/week: 0.0 standard drinks     Current Outpatient Medications:  .  aspirin 81 MG tablet, Take 81 mg by mouth daily.,  Disp: , Rfl:  .  atorvastatin (LIPITOR) 40 MG tablet, Take 1 tablet (40 mg total) by mouth daily., Disp: 90 tablet, Rfl: 1 .  escitalopram (LEXAPRO) 10 MG tablet, Take 1 tablet (10 mg total) by mouth daily., Disp: 90 tablet, Rfl: 1 .  linaclotide (LINZESS) 145 MCG CAPS capsule, Take 1 capsule (145 mcg total) by mouth daily before breakfast., Disp: 30 capsule, Rfl: 2 .  losartan (COZAAR) 25 MG tablet, Take 1 tablet (25 mg total) by mouth daily. For kidney - DM, Disp: 90 tablet, Rfl: 0 .  mirtazapine (REMERON) 30 MG tablet, TAKE 1 TABLET AT BEDTIME, Disp: 90 tablet, Rfl: 3 .  Multiple Vitamin (MULTI-VITAMIN) tablet, Take by mouth., Disp: , Rfl:  .  sitaGLIPtin (JANUVIA) 100 MG tablet, Take 1 tablet (100 mg total) by mouth daily., Disp: 90 tablet, Rfl: 1 .  nitrofurantoin, macrocrystal-monohydrate, (MACROBID) 100 MG capsule, Take 1 capsule (100 mg total) by mouth 2 (two) times daily. (Patient not taking: Reported on 12/24/2018), Disp: 10 capsule, Rfl: 0  Current Facility-Administered Medications:  .  cyanocobalamin ((VITAMIN B-12)) injection 1,000 mcg, 1,000 mcg, Intramuscular, Once, Steele Sizer, MD  No Known Allergies  I personally reviewed active problem list, medication list, allergies, family history, social history with the patient/caregiver today.  ROS  Ten systems reviewed and is negative except as mentioned in HPI   Objective  Virtual encounter, vitals not obtained.  There is no height or  weight on file to calculate BMI.  Nursing Note and Vital Signs reviewed.  Physical Exam  Awake, alert and oriented, asked her to send a pic but she states not sure if it will work  Assessment & Plan  1. Gum lesion  Explained that we can try treating empirically for an abscess or ulceration, however if no resolution she much be seen by dentist for a biopsy.  - amoxicillin-clavulanate (AUGMENTIN) 875-125 MG tablet; Take 1 tablet by mouth 2 (two) times daily.  Dispense: 14 tablet; Refill:  0 - magic mouthwash w/lidocaine SOLN; Take 5 mLs by mouth 4 (four) times daily as needed for mouth pain.  Dispense: 200 mL; Refill: 0   -Red flags and when to present for emergency care or RTC including fever >101.21F, chest pain, shortness of breath, new/worsening/un-resolving symptoms, reviewed with patient at time of visit. Follow up and care instructions discussed and provided in AVS. - I discussed the assessment and treatment plan with the patient. The patient was provided an opportunity to ask questions and all were answered. The patient agreed with the plan and demonstrated an understanding of the instructions.  - The patient was advised to call back or seek an in-person evaluation if the symptoms worsen or if the condition fails to improve as anticipated.  I provided 15 minutes of non-face-to-face time during this encounter.  Ruel Favors, MD

## 2018-12-27 ENCOUNTER — Other Ambulatory Visit: Payer: Self-pay

## 2018-12-27 ENCOUNTER — Ambulatory Visit (INDEPENDENT_AMBULATORY_CARE_PROVIDER_SITE_OTHER): Payer: BC Managed Care – PPO

## 2018-12-27 ENCOUNTER — Ambulatory Visit: Payer: BC Managed Care – PPO | Admitting: Family Medicine

## 2018-12-27 DIAGNOSIS — Z23 Encounter for immunization: Secondary | ICD-10-CM

## 2018-12-28 ENCOUNTER — Ambulatory Visit: Payer: BC Managed Care – PPO

## 2019-02-01 ENCOUNTER — Encounter: Payer: Self-pay | Admitting: Family Medicine

## 2019-02-01 ENCOUNTER — Ambulatory Visit: Payer: BC Managed Care – PPO | Admitting: Family Medicine

## 2019-02-01 ENCOUNTER — Other Ambulatory Visit: Payer: Self-pay

## 2019-02-01 VITALS — BP 122/70 | HR 81 | Temp 97.3°F | Resp 16 | Ht 63.0 in | Wt 150.0 lb

## 2019-02-01 DIAGNOSIS — E7800 Pure hypercholesterolemia, unspecified: Secondary | ICD-10-CM | POA: Diagnosis not present

## 2019-02-01 DIAGNOSIS — R809 Proteinuria, unspecified: Secondary | ICD-10-CM | POA: Diagnosis not present

## 2019-02-01 DIAGNOSIS — I700 Atherosclerosis of aorta: Secondary | ICD-10-CM

## 2019-02-01 DIAGNOSIS — D649 Anemia, unspecified: Secondary | ICD-10-CM | POA: Diagnosis not present

## 2019-02-01 DIAGNOSIS — F5101 Primary insomnia: Secondary | ICD-10-CM

## 2019-02-01 DIAGNOSIS — F330 Major depressive disorder, recurrent, mild: Secondary | ICD-10-CM

## 2019-02-01 DIAGNOSIS — E1129 Type 2 diabetes mellitus with other diabetic kidney complication: Secondary | ICD-10-CM

## 2019-02-01 DIAGNOSIS — F411 Generalized anxiety disorder: Secondary | ICD-10-CM

## 2019-02-01 DIAGNOSIS — E538 Deficiency of other specified B group vitamins: Secondary | ICD-10-CM

## 2019-02-01 LAB — POCT UA - MICROALBUMIN: Microalbumin Ur, POC: 50 mg/L

## 2019-02-01 LAB — POCT GLYCOSYLATED HEMOGLOBIN (HGB A1C): HbA1c, POC (controlled diabetic range): 5.8 % (ref 0.0–7.0)

## 2019-02-01 MED ORDER — CYANOCOBALAMIN 1000 MCG/ML IJ SOLN: 1000.0000 ug | Freq: Once | INTRAMUSCULAR | Status: DC

## 2019-02-01 MED ORDER — TEMAZEPAM 15 MG PO CAPS: 15.0000 mg | ORAL_CAPSULE | Freq: Every evening | ORAL | 0 refills | Status: DC | PRN

## 2019-02-01 NOTE — Addendum Note (Signed)
Addended by: Inda Coke on: 02/01/2019 04:20 PM   Modules accepted: Orders
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
## 2019-02-02 LAB — CBC WITH DIFFERENTIAL/PLATELET
Absolute Monocytes: 670 cells/uL (ref 200–950)
Basophils Absolute: 62 cells/uL (ref 0–200)
Basophils Relative: 0.6 %
Eosinophils Absolute: 278 cells/uL (ref 15–500)
Eosinophils Relative: 2.7 %
HCT: 39.1 % (ref 35.0–45.0)
Hemoglobin: 12.3 g/dL (ref 11.7–15.5)
Lymphs Abs: 3976 cells/uL — ABNORMAL HIGH (ref 850–3900)
MCH: 28.1 pg (ref 27.0–33.0)
MCHC: 31.5 g/dL — ABNORMAL LOW (ref 32.0–36.0)
MCV: 89.5 fL (ref 80.0–100.0)
MPV: 12 fL (ref 7.5–12.5)
Monocytes Relative: 6.5 %
Neutro Abs: 5315 cells/uL (ref 1500–7800)
Neutrophils Relative %: 51.6 %
Platelets: 227 10*3/uL (ref 140–400)
RBC: 4.37 10*6/uL (ref 3.80–5.10)
RDW: 13.1 % (ref 11.0–15.0)
Total Lymphocyte: 38.6 %
WBC: 10.3 10*3/uL (ref 3.8–10.8)

## 2019-02-02 LAB — IRON,TIBC AND FERRITIN PANEL
%SAT: 18 % (calc) (ref 16–45)
Ferritin: 126 ng/mL (ref 16–288)
Iron: 54 ug/dL (ref 45–160)
TIBC: 308 mcg/dL (calc) (ref 250–450)

## 2019-02-02 LAB — VITAMIN B12: Vitamin B-12: 550 pg/mL (ref 200–1100)

## 2019-02-15 ENCOUNTER — Ambulatory Visit (INDEPENDENT_AMBULATORY_CARE_PROVIDER_SITE_OTHER): Payer: BC Managed Care – PPO | Admitting: Family Medicine

## 2019-02-15 ENCOUNTER — Other Ambulatory Visit: Payer: Self-pay

## 2019-02-15 ENCOUNTER — Encounter: Payer: Self-pay | Admitting: Family Medicine

## 2019-02-15 DIAGNOSIS — R21 Rash and other nonspecific skin eruption: Secondary | ICD-10-CM | POA: Diagnosis not present

## 2019-02-15 MED ORDER — HYDROXYZINE HCL 10 MG PO TABS: 10.0000 mg | ORAL_TABLET | Freq: Three times a day (TID) | ORAL | 0 refills | Status: DC | PRN

## 2019-02-15 MED ORDER — TRIAMCINOLONE ACETONIDE 0.1 % EX CREA: 1.0000 "application " | TOPICAL_CREAM | Freq: Two times a day (BID) | CUTANEOUS | 0 refills | Status: DC

## 2019-02-15 NOTE — Progress Notes (Signed)
Name: Laurie Daniels   MRN: [id number]    DOB: 06/18/1955   Date:02/15/2019       Progress Note  Subjective  Chief Complaint  Chief Complaint  Patient presents with  . Rash    on arm and spreading, itching    I connected with  Laurie Daniels  on 02/15/19 at  3:00 PM EST by a video enabled telemedicine application and verified that I am speaking with the correct person using two identifiers.  I discussed the limitations of evaluation and management by telemedicine and the availability of in person appointments. The patient expressed understanding and agreed to proceed. Staff also discussed with the patient that there may be a patient responsible charge related to this service. Patient Location: at work  Provider Location: [facility]    HPI  Rash: she noticed red bumps on her left arm a few nights ago, it is very itchy, worse at night, it is not really spreading, but may have one extra bump since the initial three that she noticed the first day. No fever, chills, pain or vesicle formation. No rashes anywhere else in her body. She feels well, but has to scratch the area and is worse at night  Patient Active Problem List   Diagnosis Date Noted  . Chronic constipation 01/30/2016  . Thyroid nodule 08/19/2015  . Allergic rhinitis 08/13/2015  . Carpal tunnel syndrome 08/13/2015  . Atherosclerosis of aorta (HCC) 08/13/2015  . Nocturia 08/13/2015  . Migraine without aura and responsive to treatment 08/13/2015  . Thyroid goiter 08/13/2015  . Diabetes mellitus with microalbuminuria (HCC) 03/05/2015  . Insomnia 10/12/2014  . Hyperlipidemia 10/12/2014    Past Surgical History:  Procedure Laterality Date  . CATARACT EXTRACTION Right 08/31/2017  . CESAREAN SECTION    . COLONOSCOPY  06/17/2007   Dr Servando Snare  . COLONOSCOPY WITH PROPOFOL N/A 07/22/2017   Procedure: COLONOSCOPY WITH PROPOFOL;  Surgeon: Earline Mayotte, MD;  Location: [facility] ENDOSCOPY;  Service: Endoscopy;   Laterality: N/A;  . TUBAL LIGATION    . vitrectomy Right 07/10/2016   done at [facility] History  Problem Relation Age of Onset  . Hypertension Mother   . Diabetes Mother   . Lung cancer Father   . Colon cancer Neg Hx     Social History   Socioeconomic History  . Marital status: Divorced    Spouse name: Not on file  . Number of children: 2  . Years of education: Not on file  . Highest education level: 12th grade  Occupational History  . Occupation: housekeeping   Tobacco Use  . Smoking status: Never Smoker  . Smokeless tobacco: Never Used  Substance and Sexual Activity  . Alcohol use: No    Alcohol/week: 0.0 standard drinks  . Drug use: No  . Sexual activity: Yes    Partners: Male  Other Topics Concern  . Not on file  Social History Narrative   Divorced since 2005, she has boyfriend but they don't live together   Social Determinants of Health   Financial Resource Strain: Low Risk   . Difficulty of Paying Living Expenses: Not hard at all  Food Insecurity: No Food Insecurity  . Worried About Programme researcher, broadcasting/film/video in the Last Year: Never true  . Ran Out of Food in the Last Year: Never true  Transportation Needs: No Transportation Needs  . Lack of Transportation (Medical): No  . Lack of Transportation (Non-Medical): No  Physical  Activity: Inactive  . Days of Exercise per Week: 0 days  . Minutes of Exercise per Session: 0 min  Stress: Stress Concern Present  . Feeling of Stress : Very much  Social Connections: Slightly Isolated  . Frequency of Communication with Friends and Family: More than three times a week  . Frequency of Social Gatherings with Friends and Family: More than three times a week  . Attends Religious Services: More than 4 times per year  . Active Member of Clubs or Organizations: Yes  . Attends Archivist Meetings: More than 4 times per year  . Marital Status: Divorced  Human resources officer Violence: Not At Risk  . Fear of Current or  Ex-Partner: No  . Emotionally Abused: No  . Physically Abused: No  . Sexually Abused: No     Current Outpatient Medications:  .  aspirin 81 MG tablet, Take 81 mg by mouth daily., Disp: , Rfl:  .  atorvastatin (LIPITOR) 40 MG tablet, Take 1 tablet (40 mg total) by mouth daily., Disp: 90 tablet, Rfl: 1 .  escitalopram (LEXAPRO) 10 MG tablet, Take 1 tablet (10 mg total) by mouth daily., Disp: 90 tablet, Rfl: 1 .  linaclotide (LINZESS) 145 MCG CAPS capsule, Take 1 capsule (145 mcg total) by mouth daily before breakfast., Disp: 30 capsule, Rfl: 2 .  losartan (COZAAR) 25 MG tablet, Take 1 tablet (25 mg total) by mouth daily. For kidney - DM, Disp: 90 tablet, Rfl: 0 .  Multiple Vitamin (MULTI-VITAMIN) tablet, Take by mouth., Disp: , Rfl:  .  sitaGLIPtin (JANUVIA) 100 MG tablet, Take 1 tablet (100 mg total) by mouth daily., Disp: 90 tablet, Rfl: 1 .  temazepam (RESTORIL) 15 MG capsule, Take 1 capsule (15 mg total) by mouth at bedtime as needed for sleep., Disp: 30 capsule, Rfl: 0  No Known Allergies  I personally reviewed active problem list, medication list, allergies, family history, social history, health maintenance with the patient/caregiver today.   ROS  Constitutional: Negative for fever or weight change.  Respiratory: Negative for cough and shortness of breath.   Cardiovascular: Negative for chest pain or palpitations.  Gastrointestinal: Negative for abdominal pain, no bowel changes.  Musculoskeletal: Negative for gait problem or joint swelling.  Skin: Positive for rash.  Neurological: Negative for dizziness or headache.  No other specific complaints in a complete review of systems (except as listed in HPI above).  Objective  Virtual encounter, vitals not obtained.  There is no height or weight on file to calculate BMI.  Physical Exam  Awake, alert and oriented Erythematous rash, raised, no vesicles on medial aspect of left upper arm, per patient not tender to  touch  PHQ2/9: Depression screen [facility] 2/9 02/15/2019 02/01/2019 12/24/2018 09/06/2018 05/19/2018  Decreased Interest 0 1 0 0 1  Down, Depressed, Hopeless 0 0 0 0 0  PHQ - 2 Score 0 1 0 0 1  Altered sleeping 0 2 0 0 2  Tired, decreased energy 0 2 0 0 2  Change in appetite 0 3 0 0 3  Feeling bad or failure about yourself  0 0 0 0 0  Trouble concentrating 0 0 0 0 0  Moving slowly or fidgety/restless 0 0 0 0 1  Suicidal thoughts 0 0 0 0 0  PHQ-9 Score 0 8 0 0 9  Difficult doing work/chores Not difficult at all Somewhat difficult Not difficult at all - Somewhat difficult  Some recent data might be hidden   PHQ-2/9 Result is  negative.    Fall Risk: Fall Risk  02/15/2019 02/01/2019 12/24/2018 09/06/2018 05/19/2018  Falls in the past year? 1 0 0 0 0  Number falls in past yr: 1 0 0 0 0  Injury with Fall? 0 0 0 0 0  Risk for fall due to : History of fall(s) - - - -  Follow up Falls evaluation completed - Falls evaluation completed - -     Assessment & Plan  1. Rash in adult  - triamcinolone cream (KENALOG) 0.1 %; Apply 1 application topically 2 (two) times daily.  Dispense: 30 g; Refill: 0 - hydrOXYzine (ATARAX/VISTARIL) 10 MG tablet; Take 1 tablet (10 mg total) by mouth 3 (three) times daily as needed.  Dispense: 30 tablet; Refill: 0  I discussed the assessment and treatment plan with the patient. The patient was provided an opportunity to ask questions and all were answered. The patient agreed with the plan and demonstrated an understanding of the instructions.  The patient was advised to call back or seek an in-person evaluation if the symptoms worsen or if the condition fails to improve as anticipated.  I provided 15  minutes of non-face-to-face time during this encounter.

## 2019-03-16 ENCOUNTER — Other Ambulatory Visit: Payer: Self-pay | Admitting: Family Medicine

## 2019-03-16 DIAGNOSIS — E7800 Pure hypercholesterolemia, unspecified: Secondary | ICD-10-CM

## 2019-04-10 ENCOUNTER — Other Ambulatory Visit: Payer: Self-pay | Admitting: Family Medicine

## 2019-04-10 DIAGNOSIS — R809 Proteinuria, unspecified: Secondary | ICD-10-CM

## 2019-04-10 DIAGNOSIS — E1129 Type 2 diabetes mellitus with other diabetic kidney complication: Secondary | ICD-10-CM

## 2019-04-10 NOTE — Telephone Encounter (Signed)
Requested Prescriptions  Pending Prescriptions Disp Refills  . losartan (COZAAR) 25 MG tablet [Pharmacy Med Name: LOSARTAN POTASSIUM 25 MG TAB] 90 tablet 0    Sig: TAKE 1 TABLET (25 MG TOTAL) BY MOUTH DAILY. FOR KIDNEY - DM     Cardiovascular:  Angiotensin Receptor Blockers Failed - 04/10/2019  9:30 AM      Failed - K in normal range and within 180 days    Potassium  Date Value Ref Range Status  05/19/2018 4.5 3.5 - 5.3 mmol/L Final  01/04/2013 3.5 3.5 - 5.1 mmol/L Final         Passed - Cr in normal range and within 180 days    Creat  Date Value Ref Range Status  05/19/2018 0.99 0.50 - 0.99 mg/dL Final    Comment:    For patients >66 years of age, the reference limit for Creatinine is approximately 13% higher for people identified as African-American. .    Creatinine, Urine  Date Value Ref Range Status  12/31/2017 135 20 - 275 mg/dL Final         Passed - Patient is not pregnant      Passed - Last BP in normal range    BP Readings from Last 1 Encounters:  02/01/19 122/70         Passed - Valid encounter within last 6 months    Recent Outpatient Visits          1 month ago Rash in adult   [facility] Alba Cory, MD   2 months ago Type 2 diabetes mellitus with microalbuminuria, without long-term current use of insulin [facility])   [facility] Alba Cory, MD   3 months ago Gum lesion   [facility] Alba Cory, MD   7 months ago Type II diabetes mellitus, well controlled [facility])   [facility] [place], Danna Hefty, MD   10 months ago Type II diabetes mellitus, well controlled [facility])   [facility] Alba Cory, MD

## 2019-04-20 ENCOUNTER — Ambulatory Visit: Payer: BC Managed Care – PPO | Attending: Internal Medicine

## 2019-04-20 DIAGNOSIS — U071 COVID-19: Secondary | ICD-10-CM | POA: Insufficient documentation

## 2019-04-20 DIAGNOSIS — Z20822 Contact with and (suspected) exposure to covid-19: Secondary | ICD-10-CM

## 2019-04-22 ENCOUNTER — Telehealth: Payer: Self-pay | Admitting: Physician Assistant

## 2019-04-22 LAB — NOVEL CORONAVIRUS, NAA: SARS-CoV-2, NAA: DETECTED — AB

## 2019-04-22 NOTE — Telephone Encounter (Signed)
Called to discuss with patient about Covid symptoms and the use of bamlanivimab or casirivimab/imdevimab, a monoclonal antibody infusion for those with mild to moderate Covid symptoms and at a high risk of hospitalization.  Pt is qualified for this infusion at the [facility] infusion center due to Age > 65   Was unable to leave a VM, but I did send a MyChart message.   Cline Crock PA-C  [facility]

## 2019-04-23 ENCOUNTER — Telehealth: Payer: Self-pay | Admitting: Adult Health

## 2019-04-23 NOTE — Telephone Encounter (Signed)
Called patient about covid positivity.  She tested positive on 04/20/2019 and her symptoms began on 04/15/2019.  She is currently having decreased appetite, congestion, no taste/smell.  I reviewed with her that she meets criteria for monoclonal antibody therapy as she is at increased risk for having complications/hospitalizations from the virus. After our discussion, she does not wish to proceed at this time.  Lillard Anes, NP

## 2019-05-04 ENCOUNTER — Ambulatory Visit: Payer: Self-pay

## 2019-05-04 NOTE — Telephone Encounter (Signed)
Pt. Calling to ask if it is safe to have her second COVID 19 vaccine this Friday. Diagnosed with COVID 19 04/20/19. Has completed her quarantine and is back at work. Instructed pt. She is fine to have her second vaccine.

## 2019-06-02 ENCOUNTER — Other Ambulatory Visit: Payer: Self-pay | Admitting: Family Medicine

## 2019-06-02 DIAGNOSIS — Z1231 Encounter for screening mammogram for malignant neoplasm of breast: Secondary | ICD-10-CM

## 2019-06-28 ENCOUNTER — Ambulatory Visit
Admission: RE | Admit: 2019-06-28 | Discharge: 2019-06-28 | Disposition: A | Discharge status: Home / Self Care | Payer: BC Managed Care – PPO | Source: Ambulatory Visit | Attending: Family Medicine | Admitting: Family Medicine

## 2019-06-28 DIAGNOSIS — Z1231 Encounter for screening mammogram for malignant neoplasm of breast: Secondary | ICD-10-CM | POA: Diagnosis not present

## 2019-07-10 ENCOUNTER — Telehealth: Payer: Self-pay | Admitting: Family Medicine

## 2019-07-10 DIAGNOSIS — E1129 Type 2 diabetes mellitus with other diabetic kidney complication: Secondary | ICD-10-CM

## 2019-07-10 DIAGNOSIS — R809 Proteinuria, unspecified: Secondary | ICD-10-CM

## 2019-07-10 NOTE — Telephone Encounter (Signed)
Requested medications are due for refill today? Yes  Requested medications are on active medication list?  Yes  Last Refill:   04/10/2019   # 90 with no refills   Future visit scheduled?  No   Notes to Clinic:  Medication failed RX refill protocol due to no serum potassium nor creatinine in last 180 days.  Last labs were performed on 05/19/2018.

## 2019-07-12 NOTE — Telephone Encounter (Signed)
Laurie Daniels is full pt needs an appt

## 2019-07-26 ENCOUNTER — Other Ambulatory Visit: Payer: Self-pay | Admitting: Family Medicine

## 2019-07-26 DIAGNOSIS — R809 Proteinuria, unspecified: Secondary | ICD-10-CM

## 2019-07-26 DIAGNOSIS — E1129 Type 2 diabetes mellitus with other diabetic kidney complication: Secondary | ICD-10-CM

## 2019-08-28 ENCOUNTER — Other Ambulatory Visit: Payer: Self-pay | Admitting: Family Medicine

## 2019-08-28 DIAGNOSIS — E7800 Pure hypercholesterolemia, unspecified: Secondary | ICD-10-CM

## 2019-08-28 NOTE — Telephone Encounter (Signed)
Requested medications are due for refill today?  Yes  Requested medications are on active medication list?  Yes  Last Refill:  03/16/2019  # 90 with no refills   Future visit scheduled?  No   Notes to Clinic:  Medication failed RX refill protocol due to no labs within the past 360 days.  The last labs were performed on 05/19/2018.

## 2019-08-29 ENCOUNTER — Other Ambulatory Visit: Payer: Self-pay

## 2019-09-08 ENCOUNTER — Other Ambulatory Visit: Payer: Self-pay | Admitting: Family Medicine

## 2019-09-08 DIAGNOSIS — E1129 Type 2 diabetes mellitus with other diabetic kidney complication: Secondary | ICD-10-CM

## 2019-09-08 DIAGNOSIS — R809 Proteinuria, unspecified: Secondary | ICD-10-CM

## 2019-09-08 DIAGNOSIS — E7800 Pure hypercholesterolemia, unspecified: Secondary | ICD-10-CM

## 2019-09-08 NOTE — Telephone Encounter (Signed)
Requested medication (s) are due for refill today: yes  Requested medication (s) are on the active medication list:yes  Future visit scheduled: no  Notes to clinic: Spoke with patient this morning and she states that she is going to contact office to schedule appointment  Patient would like meds sent it until she can make appointment I see that these have been denied already but patient wanted me to send back to office Please advise     Requested Prescriptions  Pending Prescriptions Disp Refills   atorvastatin (LIPITOR) 40 MG tablet [Pharmacy Med Name: ATORVASTATIN TABS 40MG ] 90 tablet 3    Sig: TAKE 1 TABLET DAILY      Cardiovascular:  Antilipid - Statins Failed - 09/08/2019  9:36 AM      Failed - Total Cholesterol in normal range and within 360 days    Cholesterol  Date Value Ref Range Status  05/19/2018 147 <200 mg/dL Final  05/21/2018 72/53/6644 0 - 200 mg/dL Final          Failed - LDL in normal range and within 360 days    Ldl Cholesterol, Calc  Date Value Ref Range Status  06/13/2011 109 (H) 0 - 100 mg/dL Final   LDL Cholesterol (Calc)  Date Value Ref Range Status  05/19/2018 66 mg/dL (calc) Final    Comment:    Reference range: <100 . Desirable range <100 mg/dL for primary prevention;   <70 mg/dL for patients with CHD or diabetic patients  with > or = 2 CHD risk factors. 05/21/2018 LDL-C is now calculated using the Martin-Hopkins  calculation, which is a validated novel method providing  better accuracy than the Friedewald equation in the  estimation of LDL-C.  Marland Kitchen et al. Horald Pollen. Lenox Ahr): 2061-2068  (http://education.[facility].com/faq/FAQ164)           Failed - HDL in normal range and within 360 days    HDL Cholesterol  Date Value Ref Range Status  06/13/2011 32 (L) 40 - 60 mg/dL Final   HDL  Date Value Ref Range Status  05/19/2018 64 > OR = 50 mg/dL Final          Failed - Triglycerides in normal range and within 360 days    Triglycerides  Date  Value Ref Range Status  05/19/2018 88 <150 mg/dL Final  05/21/2018 75/64/3329 0 - 200 mg/dL Final          Passed - Patient is not pregnant      Passed - Valid encounter within last 12 months    Recent Outpatient Visits           6 months ago Rash in adult   [facility], [place], MD   7 months ago Type 2 diabetes mellitus with microalbuminuria, without long-term current use of insulin [facility])   [facility], MD   8 months ago Gum lesion   [facility], MD   1 year ago Type II diabetes mellitus, well controlled [facility])   [facility], MD   1 year ago Type II diabetes mellitus, well controlled [facility])   [facility], MD                losartan (COZAAR) 25 MG tablet [Pharmacy Med Name: LOSARTAN TABS 25MG ] 90 tablet 3    Sig: TAKE 1 TABLET DAILY FOR KIDNEY - DIABETES MELLITUS      Cardiovascular:  Angiotensin Receptor  Blockers Failed - 09/08/2019  9:36 AM      Failed - Cr in normal range and within 180 days    Creat  Date Value Ref Range Status  05/19/2018 0.99 0.50 - 0.99 mg/dL Final    Comment:    For patients >26 years of age, the reference limit for Creatinine is approximately 13% higher for people identified as African-American. .    Creatinine, Urine  Date Value Ref Range Status  12/31/2017 135 20 - 275 mg/dL Final          Failed - K in normal range and within 180 days    Potassium  Date Value Ref Range Status  05/19/2018 4.5 3.5 - 5.3 mmol/L Final  01/04/2013 3.5 3.5 - 5.1 mmol/L Final          Failed - Valid encounter within last 6 months    Recent Outpatient Visits           6 months ago Rash in adult   [facility] Alba [facility], MD   7 months ago Type 2 diabetes mellitus with microalbuminuria, without long-term current use of insulin [facility])   [facility]  Alba Cory, MD   8 months ago Gum lesion   [facility] Alba Cory, MD   1 year ago Type II diabetes mellitus, well controlled [facility])   [facility] Alba Cory, MD   1 year ago Type II diabetes mellitus, well controlled [facility])   [facility] [place], Danna Hefty, MD              Passed - Patient is not pregnant      Passed - Last BP in normal range    BP Readings from Last 1 Encounters:  02/01/19 122/70

## 2019-09-08 NOTE — Telephone Encounter (Signed)
Appt made 10-12-2019

## 2019-10-12 ENCOUNTER — Ambulatory Visit: Payer: BC Managed Care – PPO | Admitting: Family Medicine
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
## 2019-11-19 ENCOUNTER — Other Ambulatory Visit: Payer: Self-pay | Admitting: Family Medicine

## 2019-11-19 DIAGNOSIS — G4700 Insomnia, unspecified: Secondary | ICD-10-CM

## 2019-11-19 DIAGNOSIS — E7800 Pure hypercholesterolemia, unspecified: Secondary | ICD-10-CM

## 2019-11-19 DIAGNOSIS — E1129 Type 2 diabetes mellitus with other diabetic kidney complication: Secondary | ICD-10-CM

## 2019-11-19 DIAGNOSIS — R809 Proteinuria, unspecified: Secondary | ICD-10-CM

## 2019-11-19 NOTE — Telephone Encounter (Signed)
Requested  medications are  due for refill today yes  Requested medications are on the active medication list yes  Last refill 7/8  Future visit scheduled this coming week  Notes to clinic Failed protocol of Lipitor due to labs out of date, of Cozaar due to visit out of date. Has visit this coming week.

## 2019-11-21 ENCOUNTER — Ambulatory Visit: Payer: BC Managed Care – PPO | Admitting: Family Medicine
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
## 2020-01-17 ENCOUNTER — Encounter: Payer: Self-pay | Admitting: Family Medicine

## 2020-01-17 ENCOUNTER — Ambulatory Visit (INDEPENDENT_AMBULATORY_CARE_PROVIDER_SITE_OTHER): Payer: BC Managed Care – PPO | Admitting: Family Medicine

## 2020-01-17 ENCOUNTER — Other Ambulatory Visit: Payer: Self-pay

## 2020-01-17 VITALS — BP 132/72 | HR 75 | Temp 98.0°F | Resp 16 | Ht 63.0 in | Wt 151.7 lb

## 2020-01-17 DIAGNOSIS — G43009 Migraine without aura, not intractable, without status migrainosus: Secondary | ICD-10-CM

## 2020-01-17 DIAGNOSIS — E049 Nontoxic goiter, unspecified: Secondary | ICD-10-CM

## 2020-01-17 DIAGNOSIS — R809 Proteinuria, unspecified: Secondary | ICD-10-CM

## 2020-01-17 DIAGNOSIS — E1169 Type 2 diabetes mellitus with other specified complication: Secondary | ICD-10-CM

## 2020-01-17 DIAGNOSIS — D649 Anemia, unspecified: Secondary | ICD-10-CM | POA: Diagnosis not present

## 2020-01-17 DIAGNOSIS — E538 Deficiency of other specified B group vitamins: Secondary | ICD-10-CM

## 2020-01-17 DIAGNOSIS — K5909 Other constipation: Secondary | ICD-10-CM

## 2020-01-17 DIAGNOSIS — E1129 Type 2 diabetes mellitus with other diabetic kidney complication: Secondary | ICD-10-CM

## 2020-01-17 DIAGNOSIS — I700 Atherosclerosis of aorta: Secondary | ICD-10-CM

## 2020-01-17 DIAGNOSIS — Z23 Encounter for immunization: Secondary | ICD-10-CM

## 2020-01-17 DIAGNOSIS — F325 Major depressive disorder, single episode, in full remission: Secondary | ICD-10-CM

## 2020-01-17 DIAGNOSIS — Z111 Encounter for screening for respiratory tuberculosis: Secondary | ICD-10-CM

## 2020-01-17 DIAGNOSIS — E7800 Pure hypercholesterolemia, unspecified: Secondary | ICD-10-CM

## 2020-01-17 DIAGNOSIS — E785 Hyperlipidemia, unspecified: Secondary | ICD-10-CM

## 2020-01-17 LAB — POCT GLYCOSYLATED HEMOGLOBIN (HGB A1C): Hemoglobin A1C: 7 % — AB (ref 4.0–5.6)

## 2020-01-17 MED ORDER — LINACLOTIDE 145 MCG PO CAPS: 145.0000 ug | ORAL_CAPSULE | Freq: Every day | ORAL | 2 refills | Status: DC

## 2020-01-17 MED ORDER — LOSARTAN POTASSIUM 25 MG PO TABS: 25.0000 mg | ORAL_TABLET | Freq: Every day | ORAL | 1 refills | Status: DC

## 2020-01-17 MED ORDER — ATORVASTATIN CALCIUM 40 MG PO TABS: 40.0000 mg | ORAL_TABLET | Freq: Every day | ORAL | 1 refills | Status: DC

## 2020-01-19 LAB — COMPLETE METABOLIC PANEL WITH GFR
AG Ratio: 1.5 (calc) (ref 1.0–2.5)
ALT: 14 U/L (ref 6–29)
AST: 14 U/L (ref 10–35)
Albumin: 4.7 g/dL (ref 3.6–5.1)
Alkaline phosphatase (APISO): 115 U/L (ref 37–153)
BUN: 21 mg/dL (ref 7–25)
CO2: 29 mmol/L (ref 20–32)
Calcium: 10.6 mg/dL — ABNORMAL HIGH (ref 8.6–10.4)
Chloride: 106 mmol/L (ref 98–110)
Creat: 0.76 mg/dL (ref 0.50–0.99)
GFR, Est African American: 95 mL/min/{1.73_m2} (ref >=60)
GFR, Est Non African American: 82 mL/min/{1.73_m2} (ref >=60)
Globulin: 3.1 g/dL (calc) (ref 1.9–3.7)
Glucose, Bld: 109 mg/dL — ABNORMAL HIGH (ref 65–99)
Potassium: 5.2 mmol/L (ref 3.5–5.3)
Sodium: 143 mmol/L (ref 135–146)
Total Bilirubin: 0.4 mg/dL (ref 0.2–1.2)
Total Protein: 7.8 g/dL (ref 6.1–8.1)

## 2020-01-19 LAB — CBC WITH DIFFERENTIAL/PLATELET
Absolute Monocytes: 678 cells/uL (ref 200–950)
Basophils Absolute: 62 cells/uL (ref 0–200)
Basophils Relative: 0.7 %
Eosinophils Absolute: 290 cells/uL (ref 15–500)
Eosinophils Relative: 3.3 %
HCT: 40.7 % (ref 35.0–45.0)
Hemoglobin: 12.9 g/dL (ref 11.7–15.5)
Lymphs Abs: 3546 cells/uL (ref 850–3900)
MCH: 28.2 pg (ref 27.0–33.0)
MCHC: 31.7 g/dL — ABNORMAL LOW (ref 32.0–36.0)
MCV: 88.9 fL (ref 80.0–100.0)
MPV: 11.8 fL (ref 7.5–12.5)
Monocytes Relative: 7.7 %
Neutro Abs: 4224 cells/uL (ref 1500–7800)
Neutrophils Relative %: 48 %
Platelets: 255 10*3/uL (ref 140–400)
RBC: 4.58 10*6/uL (ref 3.80–5.10)
RDW: 12.9 % (ref 11.0–15.0)
Total Lymphocyte: 40.3 %
WBC: 8.8 10*3/uL (ref 3.8–10.8)

## 2020-01-19 LAB — QUANTIFERON-TB GOLD PLUS
Mitogen-NIL: >10 IU/mL
NIL: 0.04 IU/mL
QuantiFERON-TB Gold Plus: NEGATIVE
TB1-NIL: 0 IU/mL
TB2-NIL: <0 IU/mL

## 2020-01-19 LAB — LIPID PANEL
Cholesterol: 168 mg/dL (ref <200)
HDL: 58 mg/dL (ref >=50)
LDL Cholesterol (Calc): 89 mg/dL (calc)
Non-HDL Cholesterol (Calc): 110 mg/dL (calc) (ref <130)
Total CHOL/HDL Ratio: 2.9 (calc) (ref <5.0)
Triglycerides: 113 mg/dL (ref <150)

## 2020-01-19 LAB — MICROALBUMIN / CREATININE URINE RATIO
Creatinine, Urine: 106 mg/dL (ref 20–275)
Microalb Creat Ratio: 10 mcg/mg creat (ref <30)
Microalb, Ur: 1.1 mg/dL

## 2020-01-19 LAB — TSH: TSH: 0.49 mIU/L (ref 0.40–4.50)

## 2020-01-19 LAB — VITAMIN B12: Vitamin B-12: 459 pg/mL (ref 200–1100)

## 2020-02-08 NOTE — Progress Notes (Unsigned)
Name: Laurie Daniels   MRN: [id number]    DOB: [birth year]   Date:02/09/2020       Progress Note  Subjective  Chief Complaint  Falls  HPI  Mrs. Manninen came in because her sister is worried about her recurrent falls.  She fell for the first time about 4 weeks ago, she was going to a friend's house and tripped on the front steps. She did not lose consciousness, she recalls events before , during and after the fall. No palpitation, chest pain, nausea, vomiting or diaphoresis, no bowel or bladder incontinence. She fell again twice since, last fall was while walking to her car, she usually lands on her knees. No injuries related to the falls. She denies having headaches, dizziness, fatigue or weakness. She feels off balance at times  Patient Active Problem List   Diagnosis Date Noted  . Chronic constipation 01/30/2016  . Thyroid nodule 08/19/2015  . Allergic rhinitis 08/13/2015  . Carpal tunnel syndrome 08/13/2015  . Atherosclerosis of aorta (HCC) 08/13/2015  . Nocturia 08/13/2015  . Migraine without aura and responsive to treatment 08/13/2015  . Thyroid goiter 08/13/2015  . Diabetes mellitus with microalbuminuria (HCC) 03/05/2015  . Insomnia 10/12/2014  . Hyperlipidemia 10/12/2014    Past Surgical History:  Procedure Laterality Date  . CATARACT EXTRACTION Right 08/31/2017  . CESAREAN SECTION    . COLONOSCOPY  06/17/2007   Dr Servando Snare  . COLONOSCOPY WITH PROPOFOL N/A 07/22/2017   Procedure: COLONOSCOPY WITH PROPOFOL;  Surgeon: Earline Mayotte, MD;  Location: [facility] ENDOSCOPY;  Service: Endoscopy;  Laterality: N/A;  . TUBAL LIGATION    . vitrectomy Right 07/10/2016   done at [facility] History  Problem Relation Age of Onset  . Hypertension Mother   . Diabetes Mother   . Lung cancer Father   . Colon cancer Neg Hx     Social History   Tobacco Use  . Smoking status: Never Smoker  . Smokeless tobacco: Never Used  Substance Use Topics  . Alcohol use: No    Alcohol/week: 0.0  standard drinks     Current Outpatient Medications:  .  aspirin 81 MG tablet, Take 81 mg by mouth daily., Disp: , Rfl:  .  atorvastatin (LIPITOR) 40 MG tablet, Take 1 tablet (40 mg total) by mouth daily., Disp: 90 tablet, Rfl: 1 .  linaclotide (LINZESS) 145 MCG CAPS capsule, Take 1 capsule (145 mcg total) by mouth daily before breakfast., Disp: 30 capsule, Rfl: 2 .  losartan (COZAAR) 25 MG tablet, Take 1 tablet (25 mg total) by mouth daily., Disp: 90 tablet, Rfl: 1 .  Multiple Vitamin (MULTI-VITAMIN) tablet, Take by mouth. (Patient not taking: No sig reported), Disp: , Rfl:   No Known Allergies  I personally reviewed active problem list, medication list, allergies, family history, social history, health maintenance with the patient/caregiver today.   ROS  Ten systems reviewed and is negative except as mentioned in HPI   Objective  Vitals:   02/09/20 1050  BP: 132/80  Pulse: 97  Resp: 16  Temp: 98.4 F (36.9 C)  TempSrc: Oral  SpO2: 97%  Weight: 152 lb 4.8 oz (69.1 kg)  Height: 5\' 3"  (1.6 m)    Body mass index is 26.98 kg/m.  Physical Exam  Constitutional: Patient appears well-developed and well-nourished.  No distress.  HEENT: head atraumatic, normocephalic, pupils equal and reactive to light, ears normal TM , neck supple Cardiovascular: Normal rate, regular rhythm and normal heart sounds.  No murmur heard. No BLE edema. Pulmonary/Chest: Effort normal and breath sounds normal. No respiratory distress. Abdominal: Soft.  There is no tenderness. Neurological: left arm drift, Romberg positive, 5/5 strength but significantly weaker on left side. Normal alternate movements, unable to do a tandem walk  Psychiatric: Patient has a normal mood and affect. behavior is normal. Judgment and thought content normal.  Recent Results (from the past 2160 hour(s))  POCT HgB A1C     Status: Abnormal   Collection Time: 01/17/20 11:15 AM  Result Value Ref Range   Hemoglobin A1C 7.0 (A)  4.0 - 5.6 %   HbA1c POC (<> result, manual entry)     HbA1c, POC (prediabetic range)     HbA1c, POC (controlled diabetic range)    Lipid panel     Status: None   Collection Time: 01/17/20 12:02 PM  Result Value Ref Range   Cholesterol 168 <200 mg/dL   HDL 58 > OR = 50 mg/dL   Triglycerides 992 <426 mg/dL   LDL Cholesterol (Calc) 89 mg/dL (calc)    Comment: Reference range: <100 . Desirable range <100 mg/dL for primary prevention;   <70 mg/dL for patients with CHD or diabetic patients  with > or = 2 CHD risk factors. Marland Kitchen LDL-C is now calculated using the Martin-Hopkins  calculation, which is a validated novel method providing  better accuracy than the Friedewald equation in the  estimation of LDL-C.  Horald Pollen et al. Lenox Ahr. 8341;962(22): 2061-2068  (http://education.[facility].com/faq/FAQ164)    Total CHOL/HDL Ratio 2.9 <5.0 (calc)   Non-HDL Cholesterol (Calc) 110 <130 mg/dL (calc)    Comment: For patients with diabetes plus 1 major ASCVD risk  factor, treating to a non-HDL-C goal of <100 mg/dL  (LDL-C of <97 mg/dL) is considered a therapeutic  option.   Microalbumin / creatinine urine ratio     Status: None   Collection Time: 01/17/20 12:02 PM  Result Value Ref Range   Creatinine, Urine 106 20 - 275 mg/dL   Microalb, Ur 1.1 mg/dL    Comment: Reference Range Not established    Microalb Creat Ratio 10 <30 mcg/mg creat    Comment: . The ADA defines abnormalities in albumin excretion as follows: Marland Kitchen Albuminuria Category        Result (mcg/mg creatinine) . Normal to Mildly increased   <30 Moderately increased         30-299  Severely increased           > OR = 300 . The ADA recommends that at least two of three specimens collected within a 3-6 month period be abnormal before considering a patient to be within a diagnostic category.   COMPLETE METABOLIC PANEL WITH GFR     Status: Abnormal   Collection Time: 01/17/20 12:02 PM  Result Value Ref Range   Glucose, Bld  109 (H) 65 - 99 mg/dL    Comment: .            Fasting reference interval . For someone without known diabetes, a glucose value between 100 and 125 mg/dL is consistent with prediabetes and should be confirmed with a follow-up test. .    BUN 21 7 - 25 mg/dL   Creat 9.89 2.11 - 9.41 mg/dL    Comment: For patients >81 years of age, the reference limit for Creatinine is approximately 13% higher for people identified as African-American. .    GFR, Est Non African American 82 > OR = 60 mL/min/1.29m2   GFR, Est  African American 95 > OR = 60 mL/min/1.4573m2   BUN/Creatinine Ratio NOT APPLICABLE 6 - 22 (calc)   Sodium 143 135 - 146 mmol/L   Potassium 5.2 3.5 - 5.3 mmol/L   Chloride 106 98 - 110 mmol/L   CO2 29 20 - 32 mmol/L   Calcium 10.6 (H) 8.6 - 10.4 mg/dL   Total Protein 7.8 6.1 - 8.1 g/dL   Albumin 4.7 3.6 - 5.1 g/dL   Globulin 3.1 1.9 - 3.7 g/dL (calc)   AG Ratio 1.5 1.0 - 2.5 (calc)   Total Bilirubin 0.4 0.2 - 1.2 mg/dL   Alkaline phosphatase (APISO) 115 37 - 153 U/L   AST 14 10 - 35 U/L   ALT 14 6 - 29 U/L  CBC with Differential/Platelet     Status: Abnormal   Collection Time: 01/17/20 12:02 PM  Result Value Ref Range   WBC 8.8 3.8 - 10.8 Thousand/uL   RBC 4.58 3.80 - 5.10 Million/uL   Hemoglobin 12.9 11.7 - 15.5 g/dL   HCT 16.140.7 09.635.0 - 04.545.0 %   MCV 88.9 80.0 - 100.0 fL   MCH 28.2 27.0 - 33.0 pg   MCHC 31.7 (L) 32.0 - 36.0 g/dL   RDW 40.912.9 81.111.0 - 91.415.0 %   Platelets 255 140 - 400 Thousand/uL   MPV 11.8 7.5 - 12.5 fL   Neutro Abs 4,224 1,500 - 7,800 cells/uL   Lymphs Abs 3,546 850 - 3,900 cells/uL   Absolute Monocytes 678 200 - 950 cells/uL   Eosinophils Absolute 290 15 - 500 cells/uL   Basophils Absolute 62 0 - 200 cells/uL   Neutrophils Relative % 48 %   Total Lymphocyte 40.3 %   Monocytes Relative 7.7 %   Eosinophils Relative 3.3 %   Basophils Relative 0.7 %  Vitamin B12     Status: None   Collection Time: 01/17/20 12:02 PM  Result Value Ref Range   Vitamin  B-12 459 200 - 1,100 pg/mL  TSH     Status: None   Collection Time: 01/17/20 12:02 PM  Result Value Ref Range   TSH 0.49 0.40 - 4.50 mIU/L  QuantiFERON-TB Gold Plus     Status: None   Collection Time: 01/17/20 12:02 PM  Result Value Ref Range   QuantiFERON-TB Gold Plus NEGATIVE NEGATIVE    Comment: Negative test result. M. tuberculosis complex  infection unlikely.    NIL 0.04 IU/mL   Mitogen-NIL >10.00 IU/mL   TB1-NIL 0.00 IU/mL   TB2-NIL <0.00 IU/mL    Comment: . The Nil tube value reflects the background interferon gamma immune response of the patient's blood sample. This value has been subtracted from the patient's displayed TB and Mitogen results. . Lower than expected results with the Mitogen tube prevent false-negative Quantiferon readings by detecting a patient with a potential immune suppressive condition and/or suboptimal pre-analytical specimen handling. . The TB1 Antigen tube is coated with the M. tuberculosis-specific antigens designed to elicit responses from TB antigen primed CD4+ helper T-lymphocytes. . The TB2 Antigen tube is coated with the M. tuberculosis-specific antigens designed to elicit responses from TB antigen primed CD4+ helper and CD8+ cytotoxic T-lymphocytes. . For additional information, please refer to https://education.[facility].com/faq/FAQ204 (This link is being provided for informational/ educational purposes only.) .      PHQ2/9: [facility] screen [facility] 2/9 02/09/2020 01/17/2020 01/17/2020 02/15/2019 02/01/2019  Decreased Interest 0 0 0 0 1  Down, Depressed, Hopeless 0 0 0 0 0  PHQ - 2 Score 0 0 0  0 1  Altered sleeping 1 0 - 0 2  Tired, decreased energy 3 3 - 0 2  Change in appetite 2 1 - 0 3  Feeling bad or failure about yourself  0 0 - 0 0  Trouble concentrating 0 0 - 0 0  Moving slowly or fidgety/restless 0 0 - 0 0  Suicidal thoughts 0 0 - 0 0  PHQ-9 Score 6 4 - 0 8  Difficult doing work/chores Not difficult at all  Not difficult at all - Not difficult at all Somewhat difficult  Some recent data might be hidden    phq 9 is negative   Fall Risk: Fall Risk  02/09/2020 01/17/2020 02/15/2019 02/01/2019 12/24/2018  Falls in the past year? 1 0 1 0 0  Number falls in past yr: 1 0 1 0 0  Injury with Fall? 0 0 0 0 0  Risk for fall due to : - - History of fall(s) - -  Follow up - - Falls evaluation completed - Falls evaluation completed      Functional Status Survey: Is the patient deaf or have difficulty hearing?: No Does the patient have difficulty seeing, even when wearing glasses/contacts?: No Does the patient have difficulty concentrating, remembering, or making decisions?: Yes Does the patient have difficulty walking or climbing stairs?: No Does the patient have difficulty dressing or bathing?: No Does the patient have difficulty doing errands alone such as visiting a doctor's office or shopping?: No    Assessment & Plan  1. Recurrent falls  Explained I am worried about a stroke, we will check CT head, doppler US, reviewed labs and reminded her to take cholesterol medication daily  She did not want to have studies today, she states she needs to go back to work   2. Hypercalcemia  Recheck next visit   3. Hemiparesis, left (HCC)  - US Carotid Duplex Bilateral; Future - CT Head Wo Contrast; Future

## 2020-02-09 ENCOUNTER — Other Ambulatory Visit: Payer: Self-pay

## 2020-02-09 ENCOUNTER — Ambulatory Visit: Payer: BC Managed Care – PPO | Admitting: Family Medicine

## 2020-02-09 ENCOUNTER — Encounter: Payer: Self-pay | Admitting: Family Medicine

## 2020-02-09 VITALS — BP 132/80 | HR 97 | Temp 98.4°F | Resp 16 | Ht 63.0 in | Wt 152.3 lb

## 2020-02-09 DIAGNOSIS — R296 Repeated falls: Secondary | ICD-10-CM | POA: Diagnosis not present

## 2020-02-09 DIAGNOSIS — G8194 Hemiplegia, unspecified affecting left nondominant side: Secondary | ICD-10-CM

## 2020-03-08 ENCOUNTER — Ambulatory Visit: Payer: Self-pay | Attending: Family Medicine

## 2020-04-23 ENCOUNTER — Telehealth: Payer: Self-pay

## 2020-04-23 DIAGNOSIS — E1129 Type 2 diabetes mellitus with other diabetic kidney complication: Secondary | ICD-10-CM

## 2020-04-23 DIAGNOSIS — E7800 Pure hypercholesterolemia, unspecified: Secondary | ICD-10-CM

## 2020-04-23 DIAGNOSIS — R809 Proteinuria, unspecified: Secondary | ICD-10-CM

## 2020-04-23 MED ORDER — LOSARTAN POTASSIUM 25 MG PO TABS: 25.0000 mg | ORAL_TABLET | Freq: Every day | ORAL | 0 refills | Status: DC

## 2020-04-23 MED ORDER — ATORVASTATIN CALCIUM 40 MG PO TABS: 40.0000 mg | ORAL_TABLET | Freq: Every day | ORAL | 1 refills | Status: DC

## 2020-04-23 NOTE — Telephone Encounter (Signed)
New pharmacy

## 2020-04-24 ENCOUNTER — Other Ambulatory Visit: Payer: Self-pay

## 2020-04-24 DIAGNOSIS — E1129 Type 2 diabetes mellitus with other diabetic kidney complication: Secondary | ICD-10-CM

## 2020-04-24 DIAGNOSIS — E7800 Pure hypercholesterolemia, unspecified: Secondary | ICD-10-CM

## 2020-04-24 DIAGNOSIS — R809 Proteinuria, unspecified: Secondary | ICD-10-CM

## 2020-04-24 MED ORDER — ATORVASTATIN CALCIUM 40 MG PO TABS: 40.0000 mg | ORAL_TABLET | Freq: Every day | ORAL | 1 refills | Status: DC

## 2020-04-24 MED ORDER — LOSARTAN POTASSIUM 25 MG PO TABS: 25.0000 mg | ORAL_TABLET | Freq: Every day | ORAL | 0 refills | Status: DC

## 2020-04-24 NOTE — Telephone Encounter (Signed)
Pt is scheduled °

## 2020-05-10 DIAGNOSIS — J069 Acute upper respiratory infection, unspecified: Secondary | ICD-10-CM | POA: Diagnosis not present
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
## 2020-05-17 ENCOUNTER — Other Ambulatory Visit: Payer: Self-pay

## 2020-05-17 ENCOUNTER — Ambulatory Visit (INDEPENDENT_AMBULATORY_CARE_PROVIDER_SITE_OTHER): Payer: Medicare HMO | Admitting: Family Medicine

## 2020-05-17 ENCOUNTER — Encounter: Payer: Self-pay | Admitting: Family Medicine

## 2020-05-17 VITALS — BP 136/80 | HR 80 | Temp 98.3°F | Resp 16 | Ht 63.0 in | Wt 148.0 lb

## 2020-05-17 DIAGNOSIS — F5101 Primary insomnia: Secondary | ICD-10-CM

## 2020-05-17 DIAGNOSIS — I700 Atherosclerosis of aorta: Secondary | ICD-10-CM | POA: Diagnosis not present

## 2020-05-17 DIAGNOSIS — R809 Proteinuria, unspecified: Secondary | ICD-10-CM

## 2020-05-17 DIAGNOSIS — E1129 Type 2 diabetes mellitus with other diabetic kidney complication: Secondary | ICD-10-CM

## 2020-05-17 DIAGNOSIS — G43009 Migraine without aura, not intractable, without status migrainosus: Secondary | ICD-10-CM | POA: Diagnosis not present

## 2020-05-17 DIAGNOSIS — E1169 Type 2 diabetes mellitus with other specified complication: Secondary | ICD-10-CM

## 2020-05-17 DIAGNOSIS — E042 Nontoxic multinodular goiter: Secondary | ICD-10-CM | POA: Diagnosis not present

## 2020-05-17 DIAGNOSIS — J40 Bronchitis, not specified as acute or chronic: Secondary | ICD-10-CM

## 2020-05-17 DIAGNOSIS — E785 Hyperlipidemia, unspecified: Secondary | ICD-10-CM

## 2020-05-17 DIAGNOSIS — K5909 Other constipation: Secondary | ICD-10-CM | POA: Diagnosis not present

## 2020-05-17 DIAGNOSIS — F325 Major depressive disorder, single episode, in full remission: Secondary | ICD-10-CM

## 2020-05-17 LAB — POCT GLYCOSYLATED HEMOGLOBIN (HGB A1C): Hemoglobin A1C: 6.8 % — AB (ref 4.0–5.6)

## 2020-05-17 MED ORDER — LOSARTAN POTASSIUM 50 MG PO TABS
50.0000 mg | ORAL_TABLET | Freq: Every day | ORAL | 0 refills | Status: DC
Start: 2020-05-17 — End: 2020-08-14

## 2020-05-17 MED ORDER — MIRTAZAPINE 15 MG PO TABS: 15.0000 mg | ORAL_TABLET | Freq: Every day | ORAL | 0 refills | Status: DC

## 2020-05-17 MED ORDER — TRELEGY ELLIPTA 100-62.5-25 MCG/INH IN AEPB
1.0000 | INHALATION_SPRAY | Freq: Every day | RESPIRATORY_TRACT | 0 refills | Status: DC
Start: 2020-05-17 — End: 2020-08-20

## 2020-05-17 MED ORDER — LINACLOTIDE 72 MCG PO CAPS
72.0000 ug | ORAL_CAPSULE | Freq: Every day | ORAL | 0 refills | Status: DC
Start: 2020-05-17 — End: 2020-08-20

## 2020-05-17 MED ORDER — BENZONATATE 100 MG PO CAPS: 100.0000 mg | ORAL_CAPSULE | Freq: Two times a day (BID) | ORAL | 0 refills | Status: DC | PRN

## 2020-05-28 ENCOUNTER — Telehealth: Payer: Self-pay

## 2020-05-28 NOTE — Telephone Encounter (Signed)
Copied from CRM [id number]. Topic: General - Inquiry >> May 28, 2020  2:14 PM Laurie Daniels D wrote: Reason for CRM: Pt has been on antibiotic for two weeks for the flu.  She states now when she urinates it hurts.  CB#  [phone]

## 2020-05-30 NOTE — Telephone Encounter (Signed)
appt scheduled with Adrianna for Friday

## 2020-05-31 DIAGNOSIS — R350 Frequency of micturition: Secondary | ICD-10-CM | POA: Diagnosis not present

## 2020-05-31 DIAGNOSIS — N390 Urinary tract infection, site not specified: Secondary | ICD-10-CM | POA: Diagnosis not present

## 2020-06-01 ENCOUNTER — Ambulatory Visit: Payer: Medicare HMO | Admitting: Physician Assistant

## 2020-06-25 ENCOUNTER — Other Ambulatory Visit: Payer: Self-pay | Admitting: Family Medicine

## 2020-06-25 DIAGNOSIS — R809 Proteinuria, unspecified: Secondary | ICD-10-CM

## 2020-06-25 DIAGNOSIS — E1129 Type 2 diabetes mellitus with other diabetic kidney complication: Secondary | ICD-10-CM

## 2020-08-02 DIAGNOSIS — E042 Nontoxic multinodular goiter: Secondary | ICD-10-CM | POA: Diagnosis not present

## 2020-08-14 ENCOUNTER — Other Ambulatory Visit: Payer: Self-pay

## 2020-08-14 DIAGNOSIS — R809 Proteinuria, unspecified: Secondary | ICD-10-CM

## 2020-08-14 DIAGNOSIS — E1129 Type 2 diabetes mellitus with other diabetic kidney complication: Secondary | ICD-10-CM

## 2020-08-14 MED ORDER — LOSARTAN POTASSIUM 50 MG PO TABS: 50.0000 mg | ORAL_TABLET | Freq: Every day | ORAL | 0 refills | Status: DC

## 2020-08-17 ENCOUNTER — Other Ambulatory Visit: Payer: Self-pay | Admitting: Family Medicine

## 2020-08-17 DIAGNOSIS — F5101 Primary insomnia: Secondary | ICD-10-CM
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
## 2020-08-20 ENCOUNTER — Other Ambulatory Visit: Payer: Self-pay | Admitting: Family Medicine

## 2020-08-20 ENCOUNTER — Other Ambulatory Visit: Payer: Self-pay

## 2020-08-20 ENCOUNTER — Ambulatory Visit (INDEPENDENT_AMBULATORY_CARE_PROVIDER_SITE_OTHER): Payer: Medicare HMO | Admitting: Family Medicine

## 2020-08-20 ENCOUNTER — Encounter: Payer: Self-pay | Admitting: Family Medicine

## 2020-08-20 VITALS — BP 132/80 | HR 90 | Temp 98.2°F | Resp 16 | Ht 63.0 in | Wt 153.0 lb

## 2020-08-20 DIAGNOSIS — E1129 Type 2 diabetes mellitus with other diabetic kidney complication: Secondary | ICD-10-CM | POA: Diagnosis not present

## 2020-08-20 DIAGNOSIS — K5904 Chronic idiopathic constipation: Secondary | ICD-10-CM

## 2020-08-20 DIAGNOSIS — E1169 Type 2 diabetes mellitus with other specified complication: Secondary | ICD-10-CM | POA: Diagnosis not present

## 2020-08-20 DIAGNOSIS — Z23 Encounter for immunization: Secondary | ICD-10-CM

## 2020-08-20 DIAGNOSIS — E538 Deficiency of other specified B group vitamins: Secondary | ICD-10-CM

## 2020-08-20 DIAGNOSIS — E785 Hyperlipidemia, unspecified: Secondary | ICD-10-CM

## 2020-08-20 DIAGNOSIS — F411 Generalized anxiety disorder: Secondary | ICD-10-CM | POA: Diagnosis not present

## 2020-08-20 DIAGNOSIS — F5101 Primary insomnia: Secondary | ICD-10-CM | POA: Diagnosis not present

## 2020-08-20 DIAGNOSIS — R809 Proteinuria, unspecified: Secondary | ICD-10-CM | POA: Diagnosis not present

## 2020-08-20 DIAGNOSIS — I700 Atherosclerosis of aorta: Secondary | ICD-10-CM | POA: Diagnosis not present

## 2020-08-20 DIAGNOSIS — E042 Nontoxic multinodular goiter: Secondary | ICD-10-CM

## 2020-08-20 DIAGNOSIS — G43009 Migraine without aura, not intractable, without status migrainosus: Secondary | ICD-10-CM

## 2020-08-20 LAB — POCT GLYCOSYLATED HEMOGLOBIN (HGB A1C): Hemoglobin A1C: 6.8 % — AB (ref 4.0–5.6)

## 2020-08-20 MED ORDER — LINACLOTIDE 72 MCG PO CAPS: 72.0000 ug | ORAL_CAPSULE | Freq: Every day | ORAL | 0 refills | Status: DC

## 2020-08-20 MED ORDER — SHINGRIX 50 MCG/0.5ML IM SUSR: 0.5000 mL | Freq: Once | INTRAMUSCULAR | 0 refills | Status: AC

## 2020-08-20 MED ORDER — TRULANCE 3 MG PO TABS: 1.0000 | ORAL_TABLET | Freq: Every day | ORAL | 1 refills | Status: DC

## 2020-08-20 MED ORDER — LOSARTAN POTASSIUM 50 MG PO TABS: 50.0000 mg | ORAL_TABLET | Freq: Every day | ORAL | 0 refills | Status: DC

## 2020-08-21 LAB — COMPLETE METABOLIC PANEL WITH GFR
AG Ratio: 1.4 (calc) (ref 1.0–2.5)
ALT: 18 U/L (ref 6–29)
AST: 14 U/L (ref 10–35)
Albumin: 4.3 g/dL (ref 3.6–5.1)
Alkaline phosphatase (APISO): 99 U/L (ref 37–153)
BUN: 15 mg/dL (ref 7–25)
CO2: 33 mmol/L — ABNORMAL HIGH (ref 20–32)
Calcium: 9.9 mg/dL (ref 8.6–10.4)
Chloride: 104 mmol/L (ref 98–110)
Creat: 0.7 mg/dL (ref 0.50–0.99)
GFR, Est African American: 105 mL/min/{1.73_m2} (ref >=60)
GFR, Est Non African American: 90 mL/min/{1.73_m2} (ref >=60)
Globulin: 3 g/dL (calc) (ref 1.9–3.7)
Glucose, Bld: 133 mg/dL — ABNORMAL HIGH (ref 65–99)
Potassium: 3.9 mmol/L (ref 3.5–5.3)
Sodium: 143 mmol/L (ref 135–146)
Total Bilirubin: 0.4 mg/dL (ref 0.2–1.2)
Total Protein: 7.3 g/dL (ref 6.1–8.1)

## 2020-08-21 LAB — LIPID PANEL
Cholesterol: 134 mg/dL (ref <200)
HDL: 57 mg/dL (ref >=50)
LDL Cholesterol (Calc): 58 mg/dL (calc)
Non-HDL Cholesterol (Calc): 77 mg/dL (calc) (ref <130)
Total CHOL/HDL Ratio: 2.4 (calc) (ref <5.0)
Triglycerides: 100 mg/dL (ref <150)

## 2020-08-21 LAB — PARATHYROID HORMONE, INTACT (NO CA): PTH: 85 pg/mL — ABNORMAL HIGH (ref 16–77)

## 2020-08-24 ENCOUNTER — Other Ambulatory Visit (HOSPITAL_COMMUNITY): Payer: Self-pay | Admitting: Internal Medicine

## 2020-08-24 ENCOUNTER — Other Ambulatory Visit: Payer: Self-pay | Admitting: Internal Medicine

## 2020-08-24 DIAGNOSIS — E042 Nontoxic multinodular goiter: Secondary | ICD-10-CM

## 2020-08-29 DIAGNOSIS — E119 Type 2 diabetes mellitus without complications: Secondary | ICD-10-CM | POA: Diagnosis not present

## 2020-09-19 ENCOUNTER — Ambulatory Visit
Admission: RE | Admit: 2020-09-19 | Discharge: 2020-09-19 | Disposition: A | Discharge status: Home / Self Care | Payer: Medicare HMO | Source: Ambulatory Visit | Attending: Internal Medicine | Admitting: Internal Medicine

## 2020-09-19 ENCOUNTER — Other Ambulatory Visit: Payer: Self-pay

## 2020-09-19 DIAGNOSIS — E042 Nontoxic multinodular goiter: Secondary | ICD-10-CM | POA: Diagnosis not present

## 2020-10-09 ENCOUNTER — Other Ambulatory Visit: Payer: Self-pay

## 2020-10-09 ENCOUNTER — Encounter: Payer: Self-pay | Admitting: Family Medicine

## 2020-10-09 ENCOUNTER — Telehealth (INDEPENDENT_AMBULATORY_CARE_PROVIDER_SITE_OTHER): Payer: Medicare HMO | Admitting: Family Medicine

## 2020-10-09 DIAGNOSIS — U071 COVID-19: Secondary | ICD-10-CM

## 2020-10-09 MED ORDER — NIRMATRELVIR/RITONAVIR (PAXLOVID)TABLET
3.0000 | ORAL_TABLET | Freq: Two times a day (BID) | ORAL | 0 refills | Status: AC
  Filled 2020-10-09: qty 30, 5d supply, fill #0

## 2020-10-09 NOTE — Progress Notes (Signed)
Virtual Visit Note  I connected with Laurie Daniels on 10/09/20 at 10:40 AM EDT by phone and verified that I am speaking with the correct person using two identifiers.  Location: Patient: home Provider: [facility]   I discussed the limitations of evaluation and management by telemedicine and the availability of in person appointments. The patient expressed understanding and agreed to proceed.  History of Present Illness:  UPPER RESPIRATORY TRACT INFECTION - went to family party in [place], unsure of COVID exposure - COVID positive at home test last night - symptom onset 8/7 - body aches, runny nose, eye pain - has received 3 doses of mRNA vaccine  Worst symptom: Fever:  subjective Cough: yes, nonproductive Shortness of breath: no Wheezing: no Chest pain: yes, with cough Chest tightness: no Chest congestion: no Nasal congestion: no Runny nose: yes Sneezing: yes Sore throat: yes Sinus pressure: yes Headache: yes Ear pain: no  Ear pressure: no  Eyes red/itching:no Eye drainage/crusting: no  Vomiting: no Sick contacts: yes Relief with OTC cold/cough medications: yes  Treatments attempted: tylenol sinus   Observations/Objective:  Entirety of visit conducted over the phone.  Speaks in full sentences, no respiratory distress.   Assessment and Plan:  COVID-19 Doing well with mild sx. Is a candidate for COVID treatment, recent normal GFR. Reviewed paxlovid with patient, recommend withholding statin during treatment. Drug information handout provided. Reviewed OTC symptom relief, self-quarantine guidelines, and emergency precautions.      I discussed the assessment and treatment plan with the patient. The patient was provided an opportunity to ask questions and all were answered. The patient agreed with the plan and demonstrated an understanding of the instructions.   The patient was advised to call back or seek an in-person evaluation if the symptoms worsen or if the condition  fails to improve as anticipated.  I provided 10 minutes of non-face-to-face time during this encounter.   Caro Laroche, DO

## 2020-10-09 NOTE — Patient Instructions (Addendum)
It was great to see you!  Our plans for today:  - See below for self-isolation guidelines. You may end your quarantine once you are 5 days from symptom onset and fever free for 24 hours without use of tylenol or ibuprofen. Wear a well-fitting N95 mask for an additional 5 days. - Take the paxlovid as directed. See http://galloway.com/ for more information about the medication. - Do not take your atorvastatin while you are taking paxlovid. You can resume taking once finishing paxlovid. - I recommend getting your COVID booster once you are healed from your current infection. - Certainly, if you are having difficulties breathing or unable to keep down fluids, go to the Emergency Department.   Take care and seek immediate care sooner if you develop any concerns.   Dr. Linwood Dibbles     Person Under Monitoring Name: Laurie Daniels  Location: [address] [place] [address]   Infection Prevention Recommendations for Individuals Confirmed to have, or Being Evaluated for, 2019 Novel Coronavirus (COVID-19) Infection Who Receive Care at Home  Individuals who are confirmed to have, or are being evaluated for, COVID-19 should follow the prevention steps below until a healthcare provider or local or state health department says they can return to normal activities.  Stay home except to get medical care You should restrict activities outside your home, except for getting medical care. Do not go to work, school, or public areas, and do not use public transportation or taxis.  Call ahead before visiting your doctor Before your medical appointment, call the healthcare provider and tell them that you have, or are being evaluated for, COVID-19 infection. This will help the healthcare provider's office take steps to keep other people from getting infected. Ask your healthcare provider to call the local or state health department.  Monitor your symptoms Seek prompt  medical attention if your illness is worsening (e.g., difficulty breathing). Before going to your medical appointment, call the healthcare provider and tell them that you have, or are being evaluated for, COVID-19 infection. Ask your healthcare provider to call the local or state health department.  Wear a facemask You should wear a facemask that covers your nose and mouth when you are in the same room with other people and when you visit a healthcare provider. People who live with or visit you should also wear a facemask while they are in the same room with you.  Separate yourself from other people in your home As much as possible, you should stay in a different room from other people in your home. Also, you should use a separate bathroom, if available.  Avoid sharing household items You should not share dishes, drinking glasses, cups, eating utensils, towels, bedding, or other items with other people in your home. After using these items, you should wash them thoroughly with soap and water.  Cover your coughs and sneezes Cover your mouth and nose with a tissue when you cough or sneeze, or you can cough or sneeze into your sleeve. Throw used tissues in a lined trash can, and immediately wash your hands with soap and water for at least 20 seconds or use an alcohol-based hand rub.  Wash your [employer] your hands often and thoroughly with soap and water for at least 20 seconds. You can use an alcohol-based hand sanitizer if soap and water are not available and if your hands are not visibly dirty. Avoid touching your eyes, nose, and mouth with unwashed hands.   Prevention  Steps for Caregivers and Household Members of Individuals Confirmed to have, or Being Evaluated for, COVID-19 Infection Being Cared for in the Home  If you live with, or provide care at home for, a person confirmed to have, or being evaluated for, COVID-19 infection please follow these guidelines to prevent  infection:  Follow healthcare provider's instructions Make sure that you understand and can help the patient follow any healthcare provider instructions for all care.  Provide for the patient's basic needs You should help the patient with basic needs in the home and provide support for getting groceries, prescriptions, and other personal needs.  Monitor the patient's symptoms If they are getting sicker, call his or her medical provider and tell them that the patient has, or is being evaluated for, COVID-19 infection. This will help the healthcare provider's office take steps to keep other people from getting infected. Ask the healthcare provider to call the local or state health department.  Limit the number of people who have contact with the patient If possible, have only one caregiver for the patient. Other household members should stay in another home or place of residence. If this is not possible, they should stay in another room, or be separated from the patient as much as possible. Use a separate bathroom, if available. Restrict visitors who do not have an essential need to be in the home.  Keep older adults, very young children, and other sick people away from the patient Keep older adults, very young children, and those who have compromised immune systems or chronic health conditions away from the patient. This includes people with chronic heart, lung, or kidney conditions, diabetes, and cancer.  Ensure good ventilation Make sure that shared spaces in the home have good air flow, such as from an air conditioner or an opened window, weather permitting.  Wash your hands often Wash your hands often and thoroughly with soap and water for at least 20 seconds. You can use an alcohol based hand sanitizer if soap and water are not available and if your hands are not visibly dirty. Avoid touching your eyes, nose, and mouth with unwashed hands. Use disposable paper towels to dry your  hands. If not available, use dedicated cloth towels and replace them when they become wet.  Wear a facemask and gloves Wear a disposable facemask at all times in the room and gloves when you touch or have contact with the patient's blood, body fluids, and/or secretions or excretions, such as sweat, saliva, sputum, nasal mucus, vomit, urine, or feces.  Ensure the mask fits over your nose and mouth tightly, and do not touch it during use. Throw out disposable facemasks and gloves after using them. Do not reuse. Wash your hands immediately after removing your facemask and gloves. If your personal clothing becomes contaminated, carefully remove clothing and launder. Wash your hands after handling contaminated clothing. Place all used disposable facemasks, gloves, and other waste in a lined container before disposing them with other household waste. Remove gloves and wash your hands immediately after handling these items.  Do not share dishes, glasses, or other household items with the patient Avoid sharing household items. You should not share dishes, drinking glasses, cups, eating utensils, towels, bedding, or other items with a patient who is confirmed to have, or being evaluated for, COVID-19 infection. After the person uses these items, you should wash them thoroughly with soap and water.  Wash laundry thoroughly Immediately remove and wash clothes or bedding that have blood, body  fluids, and/or secretions or excretions, such as sweat, saliva, sputum, nasal mucus, vomit, urine, or feces, on them. Wear gloves when handling laundry from the patient. Read and follow directions on labels of laundry or clothing items and detergent. In general, wash and dry with the warmest temperatures recommended on the label.  Clean all areas the individual has used often Clean all touchable surfaces, such as counters, tabletops, doorknobs, bathroom fixtures, toilets, phones, keyboards, tablets, and bedside tables,  every day. Also, clean any surfaces that may have blood, body fluids, and/or secretions or excretions on them. Wear gloves when cleaning surfaces the patient has come in contact with. Use a diluted bleach solution (e.g., dilute bleach with 1 part bleach and 10 parts water) or a household disinfectant with a label that says EPA-registered for coronaviruses. To make a bleach solution at home, add 1 tablespoon of bleach to 1 quart (4 cups) of water. For a larger supply, add  cup of bleach to 1 gallon (16 cups) of water. Read labels of cleaning products and follow recommendations provided on product labels. Labels contain instructions for safe and effective use of the cleaning product including precautions you should take when applying the product, such as wearing gloves or eye protection and making sure you have good ventilation during use of the product. Remove gloves and wash hands immediately after cleaning.  Monitor yourself for signs and symptoms of illness Caregivers and household members are considered close contacts, should monitor their health, and will be asked to limit movement outside of the home to the extent possible. Follow the monitoring steps for close contacts listed on the symptom monitoring form.   ? If you have additional questions, contact your local health department or call the epidemiologist on call at [phone] (available 24/7). ? This guidance is subject to change. For the most up-to-date guidance from [facility], please refer to their website: TripMetro.hu

## 2020-10-10 ENCOUNTER — Other Ambulatory Visit: Payer: Self-pay | Admitting: Family Medicine

## 2020-10-10 ENCOUNTER — Other Ambulatory Visit: Payer: Self-pay

## 2020-10-10 ENCOUNTER — Telehealth: Payer: Self-pay

## 2020-10-10 MED ORDER — BENZONATATE 100 MG PO CAPS: 200.0000 mg | ORAL_CAPSULE | Freq: Two times a day (BID) | ORAL | 0 refills | Status: DC | PRN

## 2020-10-10 NOTE — Telephone Encounter (Signed)
Asking something for cough. Please send to [place] [facility] in [place]

## 2020-10-23 ENCOUNTER — Other Ambulatory Visit: Payer: Self-pay | Admitting: Family Medicine

## 2020-10-23 DIAGNOSIS — E7800 Pure hypercholesterolemia, unspecified: Secondary | ICD-10-CM

## 2020-11-12 ENCOUNTER — Other Ambulatory Visit: Payer: Self-pay | Admitting: Family Medicine

## 2020-11-12 DIAGNOSIS — E1129 Type 2 diabetes mellitus with other diabetic kidney complication: Secondary | ICD-10-CM

## 2020-11-12 DIAGNOSIS — R809 Proteinuria, unspecified: Secondary | ICD-10-CM

## 2020-11-13 NOTE — Telephone Encounter (Signed)
.  rxp

## 2020-11-18 ENCOUNTER — Other Ambulatory Visit: Payer: Self-pay | Admitting: Family Medicine

## 2020-11-18 DIAGNOSIS — F5101 Primary insomnia: Secondary | ICD-10-CM

## 2020-12-13 ENCOUNTER — Ambulatory Visit: Payer: Medicare HMO

## 2020-12-19 ENCOUNTER — Encounter: Payer: Self-pay | Admitting: Family Medicine

## 2020-12-19 ENCOUNTER — Ambulatory Visit (INDEPENDENT_AMBULATORY_CARE_PROVIDER_SITE_OTHER): Payer: Medicare HMO | Admitting: Family Medicine

## 2020-12-19 ENCOUNTER — Other Ambulatory Visit: Payer: Self-pay

## 2020-12-19 VITALS — BP 124/62 | HR 99 | Temp 97.9°F | Resp 18 | Ht 63.0 in | Wt 150.9 lb

## 2020-12-19 DIAGNOSIS — E7800 Pure hypercholesterolemia, unspecified: Secondary | ICD-10-CM

## 2020-12-19 DIAGNOSIS — E1129 Type 2 diabetes mellitus with other diabetic kidney complication: Secondary | ICD-10-CM

## 2020-12-19 DIAGNOSIS — E042 Nontoxic multinodular goiter: Secondary | ICD-10-CM

## 2020-12-19 DIAGNOSIS — F411 Generalized anxiety disorder: Secondary | ICD-10-CM | POA: Diagnosis not present

## 2020-12-19 DIAGNOSIS — K5909 Other constipation: Secondary | ICD-10-CM

## 2020-12-19 DIAGNOSIS — E538 Deficiency of other specified B group vitamins: Secondary | ICD-10-CM | POA: Diagnosis not present

## 2020-12-19 DIAGNOSIS — F5101 Primary insomnia: Secondary | ICD-10-CM | POA: Diagnosis not present

## 2020-12-19 DIAGNOSIS — E785 Hyperlipidemia, unspecified: Secondary | ICD-10-CM

## 2020-12-19 DIAGNOSIS — I700 Atherosclerosis of aorta: Secondary | ICD-10-CM | POA: Diagnosis not present

## 2020-12-19 DIAGNOSIS — Z23 Encounter for immunization: Secondary | ICD-10-CM | POA: Diagnosis not present

## 2020-12-19 DIAGNOSIS — F325 Major depressive disorder, single episode, in full remission: Secondary | ICD-10-CM | POA: Diagnosis not present

## 2020-12-19 DIAGNOSIS — E1169 Type 2 diabetes mellitus with other specified complication: Secondary | ICD-10-CM

## 2020-12-19 DIAGNOSIS — R809 Proteinuria, unspecified: Secondary | ICD-10-CM | POA: Diagnosis not present

## 2020-12-19 DIAGNOSIS — E213 Hyperparathyroidism, unspecified: Secondary | ICD-10-CM

## 2020-12-19 LAB — POCT GLYCOSYLATED HEMOGLOBIN (HGB A1C): Hemoglobin A1C: 6.8 % — AB (ref 4.0–5.6)

## 2020-12-19 MED ORDER — LINACLOTIDE 72 MCG PO CAPS: 72.0000 ug | ORAL_CAPSULE | Freq: Every day | ORAL | 0 refills | Status: DC

## 2020-12-19 MED ORDER — LOSARTAN POTASSIUM 50 MG PO TABS: 50.0000 mg | ORAL_TABLET | Freq: Every day | ORAL | 1 refills | Status: DC

## 2020-12-19 MED ORDER — ATORVASTATIN CALCIUM 40 MG PO TABS: 40.0000 mg | ORAL_TABLET | Freq: Every day | ORAL | 1 refills | Status: DC

## 2020-12-19 MED ORDER — MIRTAZAPINE 15 MG PO TABS: ORAL_TABLET | ORAL | 1 refills | Status: DC
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
## 2021-01-15 ENCOUNTER — Ambulatory Visit: Payer: Medicare HMO

## 2021-02-26 ENCOUNTER — Ambulatory Visit (INDEPENDENT_AMBULATORY_CARE_PROVIDER_SITE_OTHER): Payer: Medicare HMO

## 2021-02-26 DIAGNOSIS — Z780 Asymptomatic menopausal state: Secondary | ICD-10-CM

## 2021-02-26 DIAGNOSIS — Z0000 Encounter for general adult medical examination without abnormal findings: Secondary | ICD-10-CM | POA: Diagnosis not present

## 2021-02-26 DIAGNOSIS — Z1231 Encounter for screening mammogram for malignant neoplasm of breast: Secondary | ICD-10-CM | POA: Diagnosis not present

## 2021-02-26 NOTE — Patient Instructions (Signed)
Laurie Daniels , Thank you for taking time to come for your Medicare Wellness Visit. I appreciate your ongoing commitment to your health goals. Please review the following plan we discussed and let me know if I can assist you in the future.   Screening recommendations/referrals: Colonoscopy: done 07/22/17. Repeat 07/2027 Mammogram: done 06/28/19. Please call [phone] to schedule your mammogram and bone density screening.  Bone Density: done 08/16/12 Recommended yearly ophthalmology/optometry visit for glaucoma screening and checkup Recommended yearly dental visit for hygiene and checkup  Vaccinations: Influenza vaccine: done 12/19/20 Pneumococcal vaccine: done 01/17/20 Tdap vaccine: done 08/13/15 Shingles vaccine: done 11/14/19, 08/23/20 7 11/27/20   Covid-19:done 05/03/19, 05/06/19 & 01/22/20  Advanced directives: Advance directive discussed with you today. Even though you declined this today please call our office should you change your mind and we can give you the proper paperwork for you to fill out.   Conditions/risks identified: Recommend increasing physical activity to at least 3 days per week   Next appointment: Follow up in one year for your annual wellness visit    Preventive Care 65 Years and Older, Female Preventive care refers to lifestyle choices and visits with your health care provider that can promote health and wellness. What does preventive care include? A yearly physical exam. This is also called an annual well check. Dental exams once or twice a year. Routine eye exams. Ask your health care provider how often you should have your eyes checked. Personal lifestyle choices, including: Daily care of your teeth and gums. Regular physical activity. Eating a healthy diet. Avoiding tobacco and drug use. Limiting alcohol use. Practicing safe sex. Taking low-dose aspirin every day. Taking vitamin and mineral supplements as recommended by your health care provider. What happens  during an annual well check? The services and screenings done by your health care provider during your annual well check will depend on your age, overall health, lifestyle risk factors, and family history of disease. Counseling  Your health care provider may ask you questions about your: Alcohol use. Tobacco use. Drug use. Emotional well-being. Home and relationship well-being. Sexual activity. Eating habits. History of falls. Memory and ability to understand (cognition). Work and work Astronomer. Reproductive health. Screening  You may have the following tests or measurements: Height, weight, and BMI. Blood pressure. Lipid and cholesterol levels. These may be checked every 5 years, or more frequently if you are over 52 years old. Skin check. Lung cancer screening. You may have this screening every year starting at age 32 if you have a 30-pack-year history of smoking and currently smoke or have quit within the past 15 years. Fecal occult blood test (FOBT) of the stool. You may have this test every year starting at age 57. Flexible sigmoidoscopy or colonoscopy. You may have a sigmoidoscopy every 5 years or a colonoscopy every 10 years starting at age 45. Hepatitis C blood test. Hepatitis B blood test. Sexually transmitted disease (STD) testing. Diabetes screening. This is done by checking your blood sugar (glucose) after you have not eaten for a while (fasting). You may have this done every 1-3 years. Bone density scan. This is done to screen for osteoporosis. You may have this done starting at age 95. Mammogram. This may be done every 1-2 years. Talk to your health care provider about how often you should have regular mammograms. Talk with your health care provider about your test results, treatment options, and if necessary, the need for more tests. Vaccines  Your health care provider may  recommend certain vaccines, such as: Influenza vaccine. This is recommended every  year. Tetanus, diphtheria, and acellular pertussis (Tdap, Td) vaccine. You may need a Td booster every 10 years. Zoster vaccine. You may need this after age 90. Pneumococcal 13-valent conjugate (PCV13) vaccine. One dose is recommended after age 37. Pneumococcal polysaccharide (PPSV23) vaccine. One dose is recommended after age 24. Talk to your health care provider about which screenings and vaccines you need and how often you need them. This information is not intended to replace advice given to you by your health care provider. Make sure you discuss any questions you have with your health care provider. Document Released: 03/16/2015 Document Revised: 11/07/2015 Document Reviewed: 12/19/2014 Elsevier Interactive Patient Education  2017 [place] Prevention in the Home Falls can cause injuries. They can happen to people of all ages. There are many things you can do to make your home safe and to help prevent falls. What can I do on the outside of my home? Regularly fix the edges of walkways and driveways and fix any cracks. Remove anything that might make you trip as you walk through a door, such as a raised step or threshold. Trim any bushes or trees on the path to your home. Use bright outdoor lighting. Clear any walking paths of anything that might make someone trip, such as rocks or tools. Regularly check to see if handrails are loose or broken. Make sure that both sides of any steps have handrails. Any raised decks and porches should have guardrails on the edges. Have any leaves, snow, or ice cleared regularly. Use sand or salt on walking paths during winter. Clean up any spills in your garage right away. This includes oil or grease spills. What can I do in the bathroom? Use night lights. Install grab bars by the toilet and in the tub and shower. Do not use towel bars as grab bars. Use non-skid mats or decals in the tub or shower. If you need to sit down in the shower, use a  plastic, non-slip stool. Keep the floor dry. Clean up any water that spills on the floor as soon as it happens. Remove soap buildup in the tub or shower regularly. Attach bath mats securely with double-sided non-slip rug tape. Do not have throw rugs and other things on the floor that can make you trip. What can I do in the bedroom? Use night lights. Make sure that you have a light by your bed that is easy to reach. Do not use any sheets or blankets that are too big for your bed. They should not hang down onto the floor. Have a firm chair that has side arms. You can use this for support while you get dressed. Do not have throw rugs and other things on the floor that can make you trip. What can I do in the kitchen? Clean up any spills right away. Avoid walking on wet floors. Keep items that you use a lot in easy-to-reach places. If you need to reach something above you, use a strong step stool that has a grab bar. Keep electrical cords out of the way. Do not use floor polish or wax that makes floors slippery. If you must use wax, use non-skid floor wax. Do not have throw rugs and other things on the floor that can make you trip. What can I do with my stairs? Do not leave any items on the stairs. Make sure that there are handrails on both sides of  the stairs and use them. Fix handrails that are broken or loose. Make sure that handrails are as long as the stairways. Check any carpeting to make sure that it is firmly attached to the stairs. Fix any carpet that is loose or worn. Avoid having throw rugs at the top or bottom of the stairs. If you do have throw rugs, attach them to the floor with carpet tape. Make sure that you have a light switch at the top of the stairs and the bottom of the stairs. If you do not have them, ask someone to add them for you. What else can I do to help prevent falls? Wear shoes that: Do not have high heels. Have rubber bottoms. Are comfortable and fit you  well. Are closed at the toe. Do not wear sandals. If you use a stepladder: Make sure that it is fully opened. Do not climb a closed stepladder. Make sure that both sides of the stepladder are locked into place. Ask someone to hold it for you, if possible. Clearly mark and make sure that you can see: Any grab bars or handrails. First and last steps. Where the edge of each step is. Use tools that help you move around (mobility aids) if they are needed. These include: Canes. Walkers. Scooters. Crutches. Turn on the lights when you go into a dark area. Replace any light bulbs as soon as they burn out. Set up your furniture so you have a clear path. Avoid moving your furniture around. If any of your floors are uneven, fix them. If there are any pets around you, be aware of where they are. Review your medicines with your doctor. Some medicines can make you feel dizzy. This can increase your chance of falling. Ask your doctor what other things that you can do to help prevent falls. This information is not intended to replace advice given to you by your health care provider. Make sure you discuss any questions you have with your health care provider. Document Released: 12/14/2008 Document Revised: 07/26/2015 Document Reviewed: 03/24/2014 Elsevier Interactive Patient Education  2017 [employer].

## 2021-02-26 NOTE — Progress Notes (Signed)
Subjective:   Laurie Daniels is a 65 y.o. female who presents for an Initial Medicare Annual Wellness Visit.  Virtual Visit via Telephone Note  I connected with  Home on 02/26/21 at  2:50 PM EST by telephone and verified that I am speaking with the correct person using two identifiers.  Location: Patient: home Provider: [place] Persons participating in the virtual visit: [place]   I discussed the limitations, risks, security and privacy concerns of performing an evaluation and management service by telephone and the availability of in person appointments. The patient expressed understanding and agreed to proceed.  Interactive audio and video telecommunications were attempted between this nurse and patient, however failed, due to patient having technical difficulties OR patient did not have access to video capability.  We continued and completed visit with audio only.  Some vital signs may be absent or patient reported.   Clemetine Marker, LPN   Review of Systems     Cardiac Risk Factors include: advanced age (>50men, >32 women);dyslipidemia;hypertension     Objective:    There were no vitals filed for this visit. There is no height or weight on file to calculate BMI.  Advanced Directives 02/26/2021 07/22/2017 07/23/2016 05/01/2016 01/30/2016 09/17/2015 08/13/2015  Does Patient Have a Medical Advance Directive? No No No No No No No  Would patient like information on creating a medical advance directive? No - Patient declined No - Patient declined - - - No - patient declined information No - patient declined information    Current Medications (verified) Outpatient Encounter Medications as of 02/26/2021  Medication Sig   aspirin 81 MG tablet Take 81 mg by mouth daily.   atorvastatin (LIPITOR) 40 MG tablet Take 1 tablet (40 mg total) by mouth daily.   linaclotide (LINZESS) 72 MCG capsule Take 1 capsule (72 mcg total) by mouth daily before breakfast.    losartan (COZAAR) 50 MG tablet Take 1 tablet (50 mg total) by mouth daily.   mirtazapine (REMERON) 15 MG tablet TAKE 1 TABLET BY MOUTH EVERYDAY AT BEDTIME   vitamin B-12 (CYANOCOBALAMIN) 1000 MCG tablet Take 1,000 mcg by mouth daily.   No facility-administered encounter medications on file as of 02/26/2021.    Allergies (verified) Patient has no known allergies.   History: Past Medical History:  Diagnosis Date   Anxiety and depression    Atherosclerosis of aorta (HCC)    CTS (carpal tunnel syndrome)    Depressive disorder    Diabetes mellitus without complication (HCC)    Diabetic neuropathy (HCC)    Hyperactivity of bladder    Hypertension    Insomnia    Lumbar sprain    Ovarian failure    Rhinitis    Vaginitis and vulvovaginitis    Past Surgical History:  Procedure Laterality Date   CATARACT EXTRACTION Right 08/31/2017   CESAREAN SECTION     COLONOSCOPY  06/17/2007   Dr Allen Norris   COLONOSCOPY WITH PROPOFOL N/A 07/22/2017   Procedure: COLONOSCOPY WITH PROPOFOL;  Surgeon: Robert Bellow, MD;  Location: [facility];  Service: Endoscopy;  Laterality: N/A;   TUBAL LIGATION     vitrectomy Right 07/10/2016   done at [facility] History  Problem Relation Age of Onset   Hypertension Mother    Diabetes Mother    Lung cancer Father    Colon cancer Neg Hx    Social History   Socioeconomic History   Marital status: Divorced    Spouse name: Not  on file   Number of children: 2   Years of education: Not on file   Highest education level: 12th grade  Occupational History   Occupation: housekeeping   Tobacco Use   Smoking status: Never   Smokeless tobacco: Never  Vaping Use   Vaping Use: Never used  Substance and Sexual Activity   Alcohol use: No    Alcohol/week: 0.0 standard drinks   Drug use: No   Sexual activity: Yes    Partners: Male  Other Topics Concern   Not on file  Social History Narrative   Divorced since 2005, she has a boyfriend but they don't live  together      Pt's son lives with her   Social Determinants of Health   Financial Resource Strain: Medium Risk   Difficulty of Paying Living Expenses: Somewhat hard  Food Insecurity: No Food Insecurity   Worried About Charity fundraiser in the Last Year: Never true   [place] in the Last Year: Never true  Transportation Needs: No Transportation Needs   Lack of Transportation (Medical): No   Lack of Transportation (Non-Medical): No  Physical Activity: Inactive   Days of Exercise per Week: 0 days   Minutes of Exercise per Session: 0 min  Stress: No Stress Concern Present   Feeling of Stress : Not at all  Social Connections: Moderately Integrated   Frequency of Communication with Friends and Family: More than three times a week   Frequency of Social Gatherings with Friends and Family: More than three times a week   Attends Religious Services: More than 4 times per year   Active Member of [employer] or Organizations: Yes   Attends Music therapist: More than 4 times per year   Marital Status: Divorced    Tobacco Counseling Counseling given: Not Answered   Clinical Intake:  Pre-visit preparation completed: Yes  Pain : No/denies pain     Nutritional Risks: None Diabetes: Yes CBG done?: No Did pt. bring in CBG monitor from home?: No  Nutrition Risk Assessment:  Has the patient had any N/V/D within the last 2 months?  No  Does the patient have any non-healing wounds?  No  Has the patient had any unintentional weight loss or weight gain?  No   Diabetes:  Is the patient diabetic?  Yes  If diabetic, was a CBG obtained today?  No  Did the patient bring in their glucometer from home?  No  How often do you monitor your CBG's? N/a.   Financial Strains and Diabetes Management:  Are you having any financial strains with the device, your supplies or your medication? No .  Does the patient want to be seen by Chronic Care Management for management of their  diabetes?  No  Would the patient like to be referred to a Nutritionist or for Diabetic Management?  No   Diabetic Exams:  Diabetic Eye Exam: Completed 08/29/20.   Diabetic Foot Exam: Completed 12/19/20.     How often do you need to have someone help you when you read instructions, pamphlets, or other written materials from your doctor or pharmacy?: 1 - Never    Interpreter Needed?: No  Information entered by :: Clemetine Marker LPN   Activities of Daily Living In your present state of health, do you have any difficulty performing the following activities: 02/26/2021 12/19/2020  Hearing? N N  Vision? N N  Difficulty concentrating or making decisions? N Y  Walking or  climbing stairs? N N  Dressing or bathing? N N  Doing errands, shopping? N -  Preparing Food and eating ? N -  Using the Toilet? N -  In the past six months, have you accidently leaked urine? N -  Do you have problems with loss of bowel control? N -  Managing your Medications? N -  Managing your Finances? N -  Housekeeping or managing your Housekeeping? N -  Some recent data might be hidden    Patient Care Team: Alba Cory, MD as PCP - General (Family Medicine)  Indicate any recent Medical Services you may have received from other than [facility] providers in the past year (date may be approximate).     Assessment:   This is a routine wellness examination for E[place]y.  Hearing/Vision screen Hearing Screening - Comments:: Pt denies hearing difficulty Vision Screening - Comments:: Annual vision screen[facility]   Dietary issues and exercise activities discussed: Current Exercise Habits: The patient has a physically strenuous job, but has no regular exercise apart from work., Exercise limited by: None identified   Goals Addressed   None    Depression Screen PHQ 2/9 Scores 02/26/2021 12/19/2020 10/09/2020 08/20/2020 05/17/2020 02/09/2020 01/17/2020  PHQ - 2 Score 0 1 0 0 0 0 0   PHQ- 9 Score 0 1 0 0 3 6 4     Fall Risk Fall Risk  02/26/2021 12/19/2020 10/09/2020 08/20/2020 05/17/2020  Falls in the past year? 0 - 0 0 0  Number falls in past yr: 0 0 0 0 0  Injury with Fall? 0 0 0 0 0  Risk for fall due to : No Fall Risks No Fall Risks - - -  Follow up Falls prevention discussed Falls prevention discussed Falls evaluation completed - -    FALL RISK PREVENTION PERTAINING TO THE HOME:  Any stairs in or around the home? No  If so, are there any without handrails? No  Home free of loose throw rugs in walkways, pet beds, electrical cords, etc? Yes  Adequate lighting in your home to reduce risk of falls? Yes   ASSISTIVE DEVICES UTILIZED TO PREVENT FALLS:  Life alert? No  Use of a cane, walker or w/c? No  Grab bars in the bathroom? No  Shower chair or bench in shower? No  Elevated toilet seat or a handicapped toilet? No   TIMED UP AND GO:  Was the test performed? No . Telephonic visit.   Cognitive Function: Normal cognitive status assessed by direct observation by this Nurse Health Advisor. No abnormalities found.          Immunizations Immunization History  Administered Date(s) Administered   Fluad Quad(high Dose 65+) 12/19/2020   Influenza Inj Mdck Quad Pf 12/27/2018   Influenza, High Dose Seasonal PF 11/14/2019   Influenza,inj,Quad PF,6+ Mos 01/18/2015, 01/30/2016, 12/31/2017   Influenza-Unspecified 01/25/2013, 12/10/2016   PFIZER(Purple Top)SARS-COV-2 Vaccination 04/05/2019, 05/06/2019, 01/12/2020   Pneumococcal Conjugate-13 01/17/2020   Pneumococcal Polysaccharide-23 08/13/2015   Tdap 08/13/2015   Zoster Recombinat (Shingrix) 11/14/2019, 08/23/2020, 11/27/2020   Zoster, Live 09/17/2015    TDAP status: Up to date  Flu Vaccine status: Up to date  Pneumococcal vaccine status: Up to date  Covid-19 vaccine status: Completed vaccines  Qualifies for Shingles Vaccine? Yes   Zostavax completed Yes Shingrix Completed?: Yes  Screening  Tests Health Maintenance  Topic Date Due   COVID-19 Vaccine (4 - Booster for Pfizer series) 03/08/2020   Pneumonia Vaccine 65+ Years  old (3 - PPSV23 if available, else PCV20) 01/16/2021   HEMOGLOBIN A1C  06/19/2021   MAMMOGRAM  06/27/2021   OPHTHALMOLOGY EXAM  08/29/2021   FOOT EXAM  12/19/2021   DEXA SCAN  08/17/2022   TETANUS/TDAP  08/12/2025   COLONOSCOPY (Pts 45-72yrs Insurance coverage will need to be confirmed)  07/23/2027   INFLUENZA VACCINE  Completed   Hepatitis C Screening  Completed   Zoster Vaccines- Shingrix  Completed   HPV VACCINES  Aged Out    Health Maintenance  Health Maintenance Due  Topic Date Due   COVID-19 Vaccine (4 - Booster for Pfizer series) 03/08/2020   Pneumonia Vaccine 20+ Years old (3 - PPSV23 if available, else PCV20) 01/16/2021    Colorectal cancer screening: Type of screening: Colonoscopy. Completed 07/22/17. Repeat every 10 years  Mammogram status: Completed 06/28/19. Repeat every year  Bone Density status: Completed 08/16/12. Results reflect: Bone density results: OSTEOPENIA. Repeat every 2 years. Ordered today.   Lung Cancer Screening: (Low Dose CT Chest recommended if Age 42-80 years, 30 pack-year currently smoking OR have quit w/in 15years.) does not qualify.    Additional Screening:  Hepatitis C Screening: does qualify; Completed 05/19/18  Vision Screening: Recommended annual ophthalmology exams for early detection of glaucoma and other disorders of the eye. Is the patient up to date with their annual eye exam?  Yes  Who is the provider or what is the name of the office in which the patient attends annual eye exams? [facility].   Dental Screening: Recommended annual dental exams for proper oral hygiene  Community Resource Referral / Chronic Care Management: CRR required this visit?  No   CCM required this visit?  No      Plan:     I have personally reviewed and noted the following in the patients chart:   Medical  and social history Use of alcohol, tobacco or illicit drugs  Current medications and supplements including opioid prescriptions. Patient is not currently taking opioid prescriptions. Functional ability and status Nutritional status Physical activity Advanced directives List of other physicians Hospitalizations, surgeries, and ER visits in previous 12 months Vitals Screenings to include cognitive, depression, and falls Referrals and appointments  In addition, I have reviewed and discussed with patient certain preventive protocols, quality metrics, and best practice recommendations. A written personalized care plan for preventive services as well as general preventive health recommendations were provided to patient.     Clemetine Marker, LPN   [id number]   Nurse Notes: none

## 2021-03-27 ENCOUNTER — Telehealth: Payer: Self-pay | Admitting: Family Medicine

## 2021-03-27 ENCOUNTER — Other Ambulatory Visit: Payer: Self-pay

## 2021-03-27 DIAGNOSIS — Z1231 Encounter for screening mammogram for malignant neoplasm of breast: Secondary | ICD-10-CM

## 2021-03-27 NOTE — Telephone Encounter (Signed)
Referral Request - Has patient seen PCP for this complaint? yes °*If NO, is insurance requiring patient see PCP for this issue before PCP can refer them? °Referral for which specialty: mammogram °Preferred provider/office:N/A °Reason for referral: annual mammogram ° °

## 2021-03-27 NOTE — Telephone Encounter (Signed)
Order placed

## 2021-04-04 ENCOUNTER — Ambulatory Visit: Payer: Medicare HMO | Admitting: Podiatry

## 2021-04-04 ENCOUNTER — Encounter: Payer: Self-pay | Admitting: Podiatry

## 2021-04-04 ENCOUNTER — Other Ambulatory Visit: Payer: Self-pay

## 2021-04-04 DIAGNOSIS — L600 Ingrowing nail: Secondary | ICD-10-CM

## 2021-04-04 NOTE — Progress Notes (Signed)
Subjective:  Patient ID: Laurie Daniels, female    DOB: Jan 27, 1956,  MRN: [id number]  Chief Complaint  Patient presents with   Ingrown Toenail    Left hallux ingrown     66 y.o. female presents with the above complaint.  Patient presents with left hallux medial border ingrown.  Patient states is painful to touch.  She has tried to remove it herself but has not gotten any relief.  She is a diabetic with last A1c of 6.8%.  She is diet controlled.  She wanted to get it evaluated.  She states it hurts with ambulation.  She would like to have it removed.  She denies any other acute complaints she has not seen anyone as prior to seeing me.   Review of Systems: Negative except as noted in the HPI. Denies N/V/F/Ch.  Past Medical History:  Diagnosis Date   Anxiety and depression    Atherosclerosis of aorta (HCC)    CTS (carpal tunnel syndrome)    Depressive disorder    Diabetes mellitus without complication (HCC)    Diabetic neuropathy (HCC)    Hyperactivity of bladder    Hypertension    Insomnia    Lumbar sprain    Ovarian failure    Rhinitis    Vaginitis and vulvovaginitis     Current Outpatient Medications:    predniSONE (STERAPRED UNI-PAK 21 TAB) 10 MG (21) TBPK tablet, , Disp: , Rfl:    aspirin 81 MG tablet, Take 81 mg by mouth daily., Disp: , Rfl:    atorvastatin (LIPITOR) 40 MG tablet, Take 1 tablet (40 mg total) by mouth daily., Disp: 90 tablet, Rfl: 1   linaclotide (LINZESS) 72 MCG capsule, Take 1 capsule (72 mcg total) by mouth daily before breakfast., Disp: 90 capsule, Rfl: 0   losartan (COZAAR) 50 MG tablet, Take 1 tablet (50 mg total) by mouth daily., Disp: 90 tablet, Rfl: 1   mirtazapine (REMERON) 15 MG tablet, TAKE 1 TABLET BY MOUTH EVERYDAY AT BEDTIME, Disp: 90 tablet, Rfl: 1   vitamin B-12 (CYANOCOBALAMIN) 1000 MCG tablet, Take 1,000 mcg by mouth daily., Disp: , Rfl:   Social History   Tobacco Use  Smoking Status Never  Smokeless Tobacco Never    No Known  Allergies Objective:  There were no vitals filed for this visit. There is no height or weight on file to calculate BMI. Constitutional Well developed. Well nourished.  Vascular Dorsalis pedis pulses palpable bilaterally. Posterior tibial pulses palpable bilaterally. Capillary refill normal to all digits.  No cyanosis or clubbing noted. Pedal hair growth normal.  Neurologic Normal speech. Oriented to person, place, and time. Epicritic sensation to light touch grossly present bilaterally.  Dermatologic Painful ingrowing nail at medial nail borders of the hallux nail left. No other open wounds. No skin lesions.  Orthopedic: Normal joint ROM without pain or crepitus bilaterally. No visible deformities. No bony tenderness.   Radiographs: None Assessment:   1. Ingrown left big toenail    Plan:  Patient was evaluated and treated and all questions answered.  Ingrown Nail, left -Patient elects to proceed with minor surgery to remove ingrown toenail removal today. Consent reviewed and signed by patient. -Ingrown nail excised. See procedure note. -Educated on post-procedure care including soaking. Written instructions provided and reviewed. -Patient to follow up in 2 weeks for nail check.  Procedure: Excision of Ingrown Toenail Location: Left 1st toe medial nail borders. Anesthesia: Lidocaine 1% plain; 1.5 mL and Marcaine 0.5% plain; 1.5 mL, digital block.  Skin Prep: Betadine. Dressing: Silvadene; telfa; dry, sterile, compression dressing. Technique: Following skin prep, the toe was exsanguinated and a tourniquet was secured at the base of the toe. The affected nail border was freed, split with a nail splitter, and excised. Chemical matrixectomy was then performed with phenol and irrigated out with alcohol. The tourniquet was then removed and sterile dressing applied. Disposition: Patient tolerated procedure well. Patient to return in 2 weeks for follow-up.   No follow-ups on file.
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
## 2021-05-10 ENCOUNTER — Other Ambulatory Visit: Payer: Self-pay

## 2021-05-10 ENCOUNTER — Ambulatory Visit (INDEPENDENT_AMBULATORY_CARE_PROVIDER_SITE_OTHER): Payer: Medicare PPO | Admitting: Family Medicine

## 2021-05-10 ENCOUNTER — Encounter: Payer: Self-pay | Admitting: Family Medicine

## 2021-05-10 VITALS — BP 122/80 | HR 88 | Temp 98.0°F | Resp 16 | Ht 63.0 in | Wt 153.2 lb

## 2021-05-10 DIAGNOSIS — G43009 Migraine without aura, not intractable, without status migrainosus: Secondary | ICD-10-CM

## 2021-05-10 DIAGNOSIS — F411 Generalized anxiety disorder: Secondary | ICD-10-CM

## 2021-05-10 DIAGNOSIS — E538 Deficiency of other specified B group vitamins: Secondary | ICD-10-CM

## 2021-05-10 DIAGNOSIS — Z23 Encounter for immunization: Secondary | ICD-10-CM | POA: Diagnosis not present

## 2021-05-10 DIAGNOSIS — R809 Proteinuria, unspecified: Secondary | ICD-10-CM

## 2021-05-10 DIAGNOSIS — E1129 Type 2 diabetes mellitus with other diabetic kidney complication: Secondary | ICD-10-CM

## 2021-05-10 DIAGNOSIS — E049 Nontoxic goiter, unspecified: Secondary | ICD-10-CM

## 2021-05-10 DIAGNOSIS — E785 Hyperlipidemia, unspecified: Secondary | ICD-10-CM | POA: Diagnosis not present

## 2021-05-10 DIAGNOSIS — E7800 Pure hypercholesterolemia, unspecified: Secondary | ICD-10-CM

## 2021-05-10 DIAGNOSIS — E1169 Type 2 diabetes mellitus with other specified complication: Secondary | ICD-10-CM | POA: Diagnosis not present

## 2021-05-10 DIAGNOSIS — F330 Major depressive disorder, recurrent, mild: Secondary | ICD-10-CM | POA: Diagnosis not present

## 2021-05-10 DIAGNOSIS — E782 Mixed hyperlipidemia: Secondary | ICD-10-CM

## 2021-05-10 DIAGNOSIS — E213 Hyperparathyroidism, unspecified: Secondary | ICD-10-CM | POA: Diagnosis not present

## 2021-05-10 DIAGNOSIS — I700 Atherosclerosis of aorta: Secondary | ICD-10-CM

## 2021-05-10 DIAGNOSIS — E042 Nontoxic multinodular goiter: Secondary | ICD-10-CM

## 2021-05-10 DIAGNOSIS — F5101 Primary insomnia: Secondary | ICD-10-CM

## 2021-05-10 DIAGNOSIS — K219 Gastro-esophageal reflux disease without esophagitis: Secondary | ICD-10-CM

## 2021-05-10 MED ORDER — LOSARTAN POTASSIUM 50 MG PO TABS: 50.0000 mg | ORAL_TABLET | Freq: Every day | ORAL | 1 refills | Status: DC

## 2021-05-10 MED ORDER — ESCITALOPRAM OXALATE 5 MG PO TABS: 5.0000 mg | ORAL_TABLET | Freq: Every day | ORAL | 1 refills | Status: DC

## 2021-05-10 MED ORDER — ATORVASTATIN CALCIUM 40 MG PO TABS: 40.0000 mg | ORAL_TABLET | Freq: Every day | ORAL | 1 refills | Status: DC

## 2021-05-10 MED ORDER — OMEPRAZOLE 40 MG PO CPDR
40.0000 mg | DELAYED_RELEASE_CAPSULE | Freq: Every day | ORAL | 0 refills | Status: DC
Start: 2021-05-10 — End: 2022-08-18

## 2021-05-13 LAB — HEMOGLOBIN A1C
Hgb A1c MFr Bld: 6.9 % of total Hgb — ABNORMAL HIGH (ref <5.7)
Mean Plasma Glucose: 151 mg/dL
eAG (mmol/L): 8.4 mmol/L

## 2021-05-13 LAB — MICROALBUMIN / CREATININE URINE RATIO
Creatinine, Urine: 90 mg/dL (ref 20–275)
Microalb Creat Ratio: 10 mcg/mg creat (ref <30)
Microalb, Ur: 0.9 mg/dL

## 2021-05-13 LAB — TSH: TSH: 0.45 mIU/L (ref 0.40–4.50)

## 2021-05-13 LAB — PTH, INTACT AND CALCIUM
Calcium: 9.9 mg/dL (ref 8.6–10.4)
PTH: 95 pg/mL — ABNORMAL HIGH (ref 16–77)

## 2021-06-20 ENCOUNTER — Ambulatory Visit
Admission: RE | Admit: 2021-06-20 | Discharge: 2021-06-20 | Disposition: A | Discharge status: Home / Self Care | Payer: Medicare PPO | Source: Ambulatory Visit | Attending: Family Medicine | Admitting: Family Medicine

## 2021-06-20 DIAGNOSIS — Z1231 Encounter for screening mammogram for malignant neoplasm of breast: Secondary | ICD-10-CM | POA: Diagnosis not present
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
## 2021-10-11 ENCOUNTER — Encounter: Payer: Self-pay | Admitting: Family Medicine

## 2021-10-11 ENCOUNTER — Ambulatory Visit (INDEPENDENT_AMBULATORY_CARE_PROVIDER_SITE_OTHER): Payer: Medicare PPO | Admitting: Family Medicine

## 2021-10-11 VITALS — BP 136/74 | HR 76 | Temp 98.5°F | Resp 14 | Ht 63.0 in | Wt 154.5 lb

## 2021-10-11 DIAGNOSIS — M25561 Pain in right knee: Secondary | ICD-10-CM | POA: Diagnosis not present

## 2021-10-11 DIAGNOSIS — E538 Deficiency of other specified B group vitamins: Secondary | ICD-10-CM

## 2021-10-11 DIAGNOSIS — E7800 Pure hypercholesterolemia, unspecified: Secondary | ICD-10-CM | POA: Diagnosis not present

## 2021-10-11 DIAGNOSIS — I700 Atherosclerosis of aorta: Secondary | ICD-10-CM | POA: Diagnosis not present

## 2021-10-11 DIAGNOSIS — E1169 Type 2 diabetes mellitus with other specified complication: Secondary | ICD-10-CM | POA: Diagnosis not present

## 2021-10-11 DIAGNOSIS — G8929 Other chronic pain: Secondary | ICD-10-CM

## 2021-10-11 DIAGNOSIS — E785 Hyperlipidemia, unspecified: Secondary | ICD-10-CM | POA: Diagnosis not present

## 2021-10-11 DIAGNOSIS — E213 Hyperparathyroidism, unspecified: Secondary | ICD-10-CM | POA: Diagnosis not present

## 2021-10-11 DIAGNOSIS — E1129 Type 2 diabetes mellitus with other diabetic kidney complication: Secondary | ICD-10-CM | POA: Diagnosis not present

## 2021-10-11 DIAGNOSIS — E042 Nontoxic multinodular goiter: Secondary | ICD-10-CM | POA: Diagnosis not present

## 2021-10-11 DIAGNOSIS — R809 Proteinuria, unspecified: Secondary | ICD-10-CM

## 2021-10-11 LAB — POCT GLYCOSYLATED HEMOGLOBIN (HGB A1C): Hemoglobin A1C: 6.6 % — AB (ref 4.0–5.6)

## 2021-10-11 MED ORDER — LOSARTAN POTASSIUM 50 MG PO TABS: 50.0000 mg | ORAL_TABLET | Freq: Every day | ORAL | 1 refills | Status: DC

## 2021-10-11 MED ORDER — ATORVASTATIN CALCIUM 40 MG PO TABS: 40.0000 mg | ORAL_TABLET | Freq: Every day | ORAL | 1 refills | Status: DC

## 2021-10-23 DIAGNOSIS — M1711 Unilateral primary osteoarthritis, right knee: Secondary | ICD-10-CM | POA: Diagnosis not present

## 2021-10-23 DIAGNOSIS — M25561 Pain in right knee: Secondary | ICD-10-CM | POA: Insufficient documentation

## 2021-11-07 ENCOUNTER — Telehealth: Payer: Self-pay | Admitting: Family Medicine

## 2021-11-07 NOTE — Telephone Encounter (Signed)
Still having severe knee pain, has seen ortho and tried meloxicam. Would like to try tramodol.

## 2021-11-08 NOTE — Telephone Encounter (Signed)
Lvm for pt to schedule an appt  
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
## 2022-02-11 ENCOUNTER — Encounter: Payer: Self-pay | Admitting: Family Medicine

## 2022-02-11 ENCOUNTER — Ambulatory Visit (INDEPENDENT_AMBULATORY_CARE_PROVIDER_SITE_OTHER): Payer: Medicare PPO | Admitting: Family Medicine

## 2022-02-11 VITALS — BP 128/72 | HR 94 | Temp 97.9°F | Resp 16 | Ht 63.0 in | Wt 149.0 lb

## 2022-02-11 DIAGNOSIS — E785 Hyperlipidemia, unspecified: Secondary | ICD-10-CM

## 2022-02-11 DIAGNOSIS — E538 Deficiency of other specified B group vitamins: Secondary | ICD-10-CM

## 2022-02-11 DIAGNOSIS — K5904 Chronic idiopathic constipation: Secondary | ICD-10-CM

## 2022-02-11 DIAGNOSIS — F330 Major depressive disorder, recurrent, mild: Secondary | ICD-10-CM | POA: Diagnosis not present

## 2022-02-11 DIAGNOSIS — I700 Atherosclerosis of aorta: Secondary | ICD-10-CM

## 2022-02-11 DIAGNOSIS — R002 Palpitations: Secondary | ICD-10-CM

## 2022-02-11 DIAGNOSIS — E049 Nontoxic goiter, unspecified: Secondary | ICD-10-CM | POA: Diagnosis not present

## 2022-02-11 DIAGNOSIS — E1169 Type 2 diabetes mellitus with other specified complication: Secondary | ICD-10-CM

## 2022-02-11 DIAGNOSIS — E213 Hyperparathyroidism, unspecified: Secondary | ICD-10-CM | POA: Diagnosis not present

## 2022-02-11 DIAGNOSIS — F5101 Primary insomnia: Secondary | ICD-10-CM

## 2022-02-11 LAB — POCT GLYCOSYLATED HEMOGLOBIN (HGB A1C): Hemoglobin A1C: 7.2 % — AB (ref 4.0–5.6)

## 2022-02-11 MED ORDER — MIRTAZAPINE 15 MG PO TABS: ORAL_TABLET | ORAL | 1 refills | Status: DC

## 2022-02-11 MED ORDER — ESCITALOPRAM OXALATE 5 MG PO TABS: 5.0000 mg | ORAL_TABLET | Freq: Every day | ORAL | 1 refills | Status: DC

## 2022-02-12 LAB — CBC WITH DIFFERENTIAL/PLATELET
Absolute Monocytes: 640 cells/uL (ref 200–950)
Basophils Absolute: 50 cells/uL (ref 0–200)
Basophils Relative: 0.5 %
Eosinophils Absolute: 260 cells/uL (ref 15–500)
Eosinophils Relative: 2.6 %
HCT: 41 % (ref 35.0–45.0)
Hemoglobin: 13.2 g/dL (ref 11.7–15.5)
Lymphs Abs: 3370 cells/uL (ref 850–3900)
MCH: 29.2 pg (ref 27.0–33.0)
MCHC: 32.2 g/dL (ref 32.0–36.0)
MCV: 90.7 fL (ref 80.0–100.0)
MPV: 12.5 fL (ref 7.5–12.5)
Monocytes Relative: 6.4 %
Neutro Abs: 5680 cells/uL (ref 1500–7800)
Neutrophils Relative %: 56.8 %
Platelets: 211 10*3/uL (ref 140–400)
RBC: 4.52 10*6/uL (ref 3.80–5.10)
RDW: 12.7 % (ref 11.0–15.0)
Total Lymphocyte: 33.7 %
WBC: 10 10*3/uL (ref 3.8–10.8)

## 2022-02-12 LAB — COMPLETE METABOLIC PANEL WITH GFR
AG Ratio: 1.6 (calc) (ref 1.0–2.5)
ALT: 14 U/L (ref 6–29)
AST: 13 U/L (ref 10–35)
Albumin: 4.6 g/dL (ref 3.6–5.1)
Alkaline phosphatase (APISO): 113 U/L (ref 37–153)
BUN: 19 mg/dL (ref 7–25)
CO2: 27 mmol/L (ref 20–32)
Calcium: 10.2 mg/dL (ref 8.6–10.4)
Chloride: 105 mmol/L (ref 98–110)
Creat: 0.78 mg/dL (ref 0.50–1.05)
Globulin: 2.8 g/dL (calc) (ref 1.9–3.7)
Glucose, Bld: 123 mg/dL — ABNORMAL HIGH (ref 65–99)
Potassium: 3.9 mmol/L (ref 3.5–5.3)
Sodium: 142 mmol/L (ref 135–146)
Total Bilirubin: 0.3 mg/dL (ref 0.2–1.2)
Total Protein: 7.4 g/dL (ref 6.1–8.1)
eGFR: 83 mL/min/{1.73_m2} (ref >=60)

## 2022-02-12 LAB — LIPID PANEL
Cholesterol: 161 mg/dL (ref <200)
HDL: 63 mg/dL (ref >=50)
LDL Cholesterol (Calc): 79 mg/dL (calc)
Non-HDL Cholesterol (Calc): 98 mg/dL (calc) (ref <130)
Total CHOL/HDL Ratio: 2.6 (calc) (ref <5.0)
Triglycerides: 107 mg/dL (ref <150)

## 2022-02-12 LAB — VITAMIN B12: Vitamin B-12: 1060 pg/mL (ref 200–1100)

## 2022-02-12 LAB — PTH, INTACT AND CALCIUM
Calcium: 10.2 mg/dL (ref 8.6–10.4)
PTH: 104 pg/mL — ABNORMAL HIGH (ref 16–77)

## 2022-02-12 LAB — TSH: TSH: 0.43 mIU/L (ref 0.40–4.50)

## 2022-02-12 LAB — VITAMIN D 25 HYDROXY (VIT D DEFICIENCY, FRACTURES): Vit D, 25-Hydroxy: 9 ng/mL — ABNORMAL LOW (ref 30–100)

## 2022-02-13 ENCOUNTER — Other Ambulatory Visit: Payer: Self-pay | Admitting: Family Medicine

## 2022-02-13 ENCOUNTER — Telehealth: Payer: Self-pay

## 2022-02-13 DIAGNOSIS — E1169 Type 2 diabetes mellitus with other specified complication: Secondary | ICD-10-CM

## 2022-02-13 MED ORDER — SITAGLIPTIN PHOSPHATE 100 MG PO TABS: 100.0000 mg | ORAL_TABLET | Freq: Every day | ORAL | 1 refills | Status: DC

## 2022-02-14 NOTE — Telephone Encounter (Signed)
Januvia sent.

## 2022-02-27 ENCOUNTER — Ambulatory Visit: Payer: Medicare HMO

## 2022-03-04 ENCOUNTER — Encounter: Payer: Self-pay | Admitting: Physician Assistant

## 2022-03-04 ENCOUNTER — Ambulatory Visit (INDEPENDENT_AMBULATORY_CARE_PROVIDER_SITE_OTHER): Payer: Medicare PPO | Admitting: Physician Assistant

## 2022-03-04 DIAGNOSIS — Z0000 Encounter for general adult medical examination without abnormal findings: Secondary | ICD-10-CM | POA: Diagnosis not present

## 2022-03-04 NOTE — Patient Instructions (Signed)
Laurie Daniels , Thank you for taking time to come for your Medicare Wellness Visit. I appreciate your ongoing commitment to your health goals. Please review the following plan we discussed and let me know if I can assist you in the future.   Screening recommendations/referrals: Colonoscopy: Completed in 2019 - repeat every 10 years.  Mammogram: Completed 06/20/21- repeat every year  Bone Density: Completed in 2014- repeat every 10 years per recommendations from previous screening. Please discuss this with your PCP so orders can be placed  Recommended yearly ophthalmology/optometry visit for glaucoma screening and checkup Recommended yearly dental visit for hygiene and checkup  Vaccinations: Influenza vaccine: Up to date  Pneumococcal vaccine: Completed  Tdap vaccine: Up to date, next due in 2027 Shingles vaccine: Completed    Covid-19:Please stay up to date on vaccine and booster recommendations for your age group  Advanced directives: Not completed and not on file. I have attached information for completing a advanced directives to your AVS. Please review this at your leisure   Conditions/risks identified: None   Next appointment: Follow up in one year for your annual wellness visit    Preventive Care 65 Years and Older, Female Preventive care refers to lifestyle choices and visits with your health care provider that can promote health and wellness. What does preventive care include? A yearly physical exam. This is also called an annual well check. Dental exams once or twice a year. Routine eye exams. Ask your health care provider how often you should have your eyes checked. Personal lifestyle choices, including: Daily care of your teeth and gums. Regular physical activity. Eating a healthy diet. Avoiding tobacco and drug use. Limiting alcohol use. Practicing safe sex. Taking low-dose aspirin every day. Taking vitamin and mineral supplements as recommended by your health care  provider. What happens during an annual well check? The services and screenings done by your health care provider during your annual well check will depend on your age, overall health, lifestyle risk factors, and family history of disease. Counseling  Your health care provider may ask you questions about your: Alcohol use. Tobacco use. Drug use. Emotional well-being. Home and relationship well-being. Sexual activity. Eating habits. History of falls. Memory and ability to understand (cognition). Work and work Statistician. Reproductive health. Screening  You may have the following tests or measurements: Height, weight, and BMI. Blood pressure. Lipid and cholesterol levels. These may be checked every 5 years, or more frequently if you are over 24 years old. Skin check. Lung cancer screening. You may have this screening every year starting at age 55 if you have a 30-pack-year history of smoking and currently smoke or have quit within the past 15 years. Fecal occult blood test (FOBT) of the stool. You may have this test every year starting at age 47. Flexible sigmoidoscopy or colonoscopy. You may have a sigmoidoscopy every 5 years or a colonoscopy every 10 years starting at age 41. Hepatitis C blood test. Hepatitis B blood test. Sexually transmitted disease (STD) testing. Diabetes screening. This is done by checking your blood sugar (glucose) after you have not eaten for a while (fasting). You may have this done every 1-3 years. Bone density scan. This is done to screen for osteoporosis. You may have this done starting at age 17. Mammogram. This may be done every 1-2 years. Talk to your health care provider about how often you should have regular mammograms. Talk with your health care provider about your test results, treatment options, and if necessary,  the need for more tests. Vaccines  Your health care provider may recommend certain vaccines, such as: Influenza vaccine. This is  recommended every year. Tetanus, diphtheria, and acellular pertussis (Tdap, Td) vaccine. You may need a Td booster every 10 years. Zoster vaccine. You may need this after age 64. Pneumococcal 13-valent conjugate (PCV13) vaccine. One dose is recommended after age 62. Pneumococcal polysaccharide (PPSV23) vaccine. One dose is recommended after age 44. Talk to your health care provider about which screenings and vaccines you need and how often you need them. This information is not intended to replace advice given to you by your health care provider. Make sure you discuss any questions you have with your health care provider. Document Released: 03/16/2015 Document Revised: 11/07/2015 Document Reviewed: 12/19/2014 Elsevier Interactive Patient Education  2017 [place] Prevention in the Home Falls can cause injuries. They can happen to people of all ages. There are many things you can do to make your home safe and to help prevent falls. What can I do on the outside of my home? Regularly fix the edges of walkways and driveways and fix any cracks. Remove anything that might make you trip as you walk through a door, such as a raised step or threshold. Trim any bushes or trees on the path to your home. Use bright outdoor lighting. Clear any walking paths of anything that might make someone trip, such as rocks or tools. Regularly check to see if handrails are loose or broken. Make sure that both sides of any steps have handrails. Any raised decks and porches should have guardrails on the edges. Have any leaves, snow, or ice cleared regularly. Use sand or salt on walking paths during winter. Clean up any spills in your garage right away. This includes oil or grease spills. What can I do in the bathroom? Use night lights. Install grab bars by the toilet and in the tub and shower. Do not use towel bars as grab bars. Use non-skid mats or decals in the tub or shower. If you need to sit down in  the shower, use a plastic, non-slip stool. Keep the floor dry. Clean up any water that spills on the floor as soon as it happens. Remove soap buildup in the tub or shower regularly. Attach bath mats securely with double-sided non-slip rug tape. Do not have throw rugs and other things on the floor that can make you trip. What can I do in the bedroom? Use night lights. Make sure that you have a light by your bed that is easy to reach. Do not use any sheets or blankets that are too big for your bed. They should not hang down onto the floor. Have a firm chair that has side arms. You can use this for support while you get dressed. Do not have throw rugs and other things on the floor that can make you trip. What can I do in the kitchen? Clean up any spills right away. Avoid walking on wet floors. Keep items that you use a lot in easy-to-reach places. If you need to reach something above you, use a strong step stool that has a grab bar. Keep electrical cords out of the way. Do not use floor polish or wax that makes floors slippery. If you must use wax, use non-skid floor wax. Do not have throw rugs and other things on the floor that can make you trip. What can I do with my stairs? Do not leave any items on  the stairs. Make sure that there are handrails on both sides of the stairs and use them. Fix handrails that are broken or loose. Make sure that handrails are as long as the stairways. Check any carpeting to make sure that it is firmly attached to the stairs. Fix any carpet that is loose or worn. Avoid having throw rugs at the top or bottom of the stairs. If you do have throw rugs, attach them to the floor with carpet tape. Make sure that you have a light switch at the top of the stairs and the bottom of the stairs. If you do not have them, ask someone to add them for you. What else can I do to help prevent falls? Wear shoes that: Do not have high heels. Have rubber bottoms. Are comfortable  and fit you well. Are closed at the toe. Do not wear sandals. If you use a stepladder: Make sure that it is fully opened. Do not climb a closed stepladder. Make sure that both sides of the stepladder are locked into place. Ask someone to hold it for you, if possible. Clearly mark and make sure that you can see: Any grab bars or handrails. First and last steps. Where the edge of each step is. Use tools that help you move around (mobility aids) if they are needed. These include: Canes. Walkers. Scooters. Crutches. Turn on the lights when you go into a dark area. Replace any light bulbs as soon as they burn out. Set up your furniture so you have a clear path. Avoid moving your furniture around. If any of your floors are uneven, fix them. If there are any pets around you, be aware of where they are. Review your medicines with your doctor. Some medicines can make you feel dizzy. This can increase your chance of falling. Ask your doctor what other things that you can do to help prevent falls. This information is not intended to replace advice given to you by your health care provider. Make sure you discuss any questions you have with your health care provider. Document Released: 12/14/2008 Document Revised: 07/26/2015 Document Reviewed: 03/24/2014 Elsevier Interactive Patient Education  2017 [employer].

## 2022-03-04 NOTE — Progress Notes (Signed)
Virtual Visit via Telephone Note  I connected with  Laurie Daniels on 03/04/22 at  3:20 PM EST by telephone and verified that I am speaking with the correct person using two identifiers.  Location: Patient: at home  Provider: [place], [place]     I discussed the limitations, risks, security and privacy concerns of performing an evaluation and management service by telephone and the availability of in person appointments. The patient expressed understanding and agreed to proceed.  Interactive audio and video telecommunications were attempted between this nurse and patient, however failed, due to patient having technical difficulties OR patient did not have access to video capability.  We continued and completed visit with audio only.  Some vital signs may be absent or patient reported.   Today's Provider: Talitha Givens, MHS, PA-C Introduced myself to the patient as a PA-C and provided education on APPs in clinical practice.       Subjective:   Laurie Daniels is a 67 y.o. female who presents for Medicare Annual (Subsequent) preventive examination.  Review of Systems:        Objective:     Vitals: There were no vitals taken for this visit.  There is no height or weight on file to calculate BMI.     02/26/2021    3:06 PM 07/22/2017   10:25 AM 07/23/2016    2:19 PM 05/01/2016    8:10 AM 01/30/2016    8:13 AM 09/17/2015    4:00 PM 08/13/2015    4:29 PM  Advanced Directives  Does Patient Have a Medical Advance Directive? _0  No No  Would patient like information on creating a medical advance directive? No - Patient declined No - Patient declined    No - patient declined information No - patient declined information    Tobacco Social History   Tobacco Use  Smoking Status Never  Smokeless Tobacco Never     Counseling given: Not Answered   Clinical Intake:  Pre-visit preparation completed: Yes  Pain : No/denies pain Pain Score:  0-No pain     Nutritional Status: BMI 25 -29 Overweight Nutritional Risks: None Diabetes: Yes CBG done?: No Did pt. bring in CBG monitor from home?: No  How often do you need to have someone help you when you read instructions, pamphlets, or other written materials from your doctor or pharmacy?: 1 - Never What is the last grade level you completed in school?: 12th grade  Interpreter Needed?: No     Past Medical History:  Diagnosis Date   Anxiety and depression    Atherosclerosis of aorta ([place])    CTS (carpal tunnel syndrome)    Depressive disorder    Diabetes mellitus without complication ([place])    Diabetic neuropathy ([place])    Hyperactivity of bladder    Hypertension    Insomnia    Lumbar sprain    Ovarian failure    Rhinitis    Vaginitis and vulvovaginitis    Past Surgical History:  Procedure Laterality Date   CATARACT EXTRACTION Right 08/31/2017   CESAREAN SECTION     COLONOSCOPY  06/17/2007   Dr Allen Norris   COLONOSCOPY WITH PROPOFOL N/A 07/22/2017   Procedure: COLONOSCOPY WITH PROPOFOL;  Surgeon: Robert Bellow, MD;  Location: [facility] ENDOSCOPY;  Service: Endoscopy;  Laterality: N/A;   TUBAL LIGATION     vitrectomy Right 07/10/2016   done at [facility] History  Problem Relation Age of  Onset   Hypertension Mother    Diabetes Mother    Lung cancer Father    Colon cancer Neg Hx    Social History   Socioeconomic History   Marital status: Divorced    Spouse name: Not on file   Number of children: 2   Years of education: Not on file   Highest education level: 12th grade  Occupational History   Occupation: housekeeping   Tobacco Use   Smoking status: Never   Smokeless tobacco: Never  Vaping Use   Vaping Use: Never used  Substance and Sexual Activity   Alcohol use: No    Alcohol/week: 0.0 standard drinks of alcohol   Drug use: No   Sexual activity: Yes    Partners: Male  Other Topics Concern   Not on file  Social History Narrative   Divorced since  2005, she has a boyfriend but they don't live together      Pt's son lives with her   Social Determinants of Health   Financial Resource Strain: Medium Risk (02/26/2021)   Overall Financial Resource Strain (CARDIA)    Difficulty of Paying Living Expenses: Somewhat hard  Food Insecurity: No Food Insecurity (02/26/2021)   Hunger Vital Sign    Worried About Running Out of Food in the Last Year: Never true    Ran Out of Food in the Last Year: Never true  Transportation Needs: No Transportation Needs (02/26/2021)   PRAPARE - Hydrologist (Medical): No    Lack of Transportation (Non-Medical): No  Physical Activity: Inactive (02/26/2021)   Exercise Vital Sign    Days of Exercise per Week: 0 days    Minutes of Exercise per Session: 0 min  Stress: No Stress Concern Present (02/26/2021)   [place]    Feeling of Stress : Not at all  Social Connections: Moderately Integrated (02/26/2021)   Social Connection and Isolation Panel [NHANES]    Frequency of Communication with Friends and Family: More than three times a week    Frequency of Social Gatherings with Friends and Family: More than three times a week    Attends Religious Services: More than 4 times per year    Active Member of [employer] or Organizations: Yes    Attends Archivist Meetings: More than 4 times per year    Marital Status: Divorced    Outpatient Encounter Medications as of 03/04/2022  Medication Sig   aspirin 81 MG tablet Take 81 mg by mouth daily.   atorvastatin (LIPITOR) 40 MG tablet Take 1 tablet (40 mg total) by mouth daily.   escitalopram (LEXAPRO) 5 MG tablet Take 1 tablet (5 mg total) by mouth daily.   linaclotide (LINZESS) 72 MCG capsule Take 1 capsule (72 mcg total) by mouth daily before breakfast.   losartan (COZAAR) 50 MG tablet Take 1 tablet (50 mg total) by mouth daily.   mirtazapine (REMERON) 15 MG tablet TAKE  1 TABLET BY MOUTH EVERYDAY AT BEDTIME   sitaGLIPtin (JANUVIA) 100 MG tablet Take 1 tablet (100 mg total) by mouth daily.   Cyanocobalamin (B-12) 1000 MCG SUBL Place 1 tablet under the tongue 3 (three) times a week. (Patient not taking: Reported on 02/11/2022)   omeprazole (PRILOSEC) 40 MG capsule Take 1 capsule (40 mg total) by mouth daily. (Patient not taking: Reported on 02/11/2022)   No facility-administered encounter medications on file as of 03/04/2022.    Activities of Daily Living  03/04/2022    3:09 PM 03/04/2022   12:49 PM  In your present state of health, do you have any difficulty performing the following activities:  Hearing? 0 0  Vision? 0 0  Difficulty concentrating or making decisions? 1 0  Walking or climbing stairs? 0 0  Dressing or bathing? 0 0  Doing errands, shopping? 0 0  Preparing Food and eating ? N   Using the Toilet? N   In the past six months, have you accidently leaked urine? Y   Comment Reports difficulty controlling bladder on a frequent basis   Do you have problems with loss of bowel control? N   Managing your Medications? N   Managing your Finances? N   Housekeeping or managing your Housekeeping? N     Patient Care Team: Steele Sizer, MD as PCP - General (Family Medicine)    Assessment:   This is a routine wellness examination for Laurie Daniels.  Exercise Activities and Dietary recommendations     Goals Addressed   None     Fall Risk:    03/04/2022   12:49 PM 02/11/2022    3:37 PM 10/11/2021    3:26 PM 05/10/2021    3:34 PM 02/26/2021    3:06 PM  [place] in the past year? 0 0 0 0 0  Number falls in past yr: 0   0 0  Injury with Fall? 0   0 0  Risk for fall due to : _0   Follow up Falls prevention discussed;Education provided;Falls evaluation completed Falls prevention discussed;Falls evaluation completed;Education provided Falls prevention discussed Falls prevention  discussed Falls prevention discussed    FALL RISK PREVENTION PERTAINING TO THE HOME:  Any stairs in or around the home? Yes  If so, are there any without handrails? Yes   Home free of loose throw rugs in walkways, pet beds, electrical cords, etc? Yes  Adequate lighting in your home to reduce risk of falls? Yes   ASSISTIVE DEVICES UTILIZED TO PREVENT FALLS:  Life alert? No  Use of a cane, walker or w/c? No  Grab bars in the bathroom? No  Shower chair or bench in shower? No  Elevated toilet seat or a handicapped toilet? No   DME ORDERS:  DME order needed?  No   TIMED UP AND GO:  Was the test performed?  No telephone visit .  Length of time to ambulate 10 feet: 0 sec.      Depression Screen    03/04/2022   12:49 PM 02/11/2022    3:37 PM 10/11/2021    3:26 PM 05/10/2021    3:34 PM  PHQ 2/9 Scores  PHQ - 2 Score 0 0 0 4  PHQ- 9 Score 0 0 5 6     Cognitive Function        03/04/2022    3:12 PM  6CIT Screen  What Year? 0 points  What month? 0 points  What time? 0 points  Count back from 20 0 points  Months in reverse 2 points  Repeat phrase 0 points  Total Score 2 points    Immunization History  Administered Date(s) Administered   Fluad Quad(high Dose 65+) 12/19/2020   Influenza Inj Mdck Quad Pf 12/27/2018   Influenza, High Dose Seasonal PF 11/14/2019   Influenza,inj,Quad PF,6+ Mos 01/18/2015, 01/30/2016, 12/31/2017   Influenza-Unspecified 01/25/2013, 12/10/2016, 12/25/2021   PFIZER(Purple Top)SARS-COV-2 Vaccination 04/05/2019, 05/06/2019,  01/12/2020   PNEUMOCOCCAL CONJUGATE-20 05/10/2021   Pneumococcal Conjugate-13 01/17/2020   Pneumococcal Polysaccharide-23 08/13/2015   Tdap 08/13/2015   Zoster Recombinat (Shingrix) 11/14/2019, 08/23/2020, 11/27/2020   Zoster, Live 09/17/2015    Qualifies for Shingles Vaccine? Completed Zoster  Tdap: Up to date  Flu Vaccine: Up to date  Pneumococcal Vaccine: Completed 05/10/21  Covid-19 Vaccine:  Completed  vaccines  Screening Tests Health Maintenance  Topic Date Due   COVID-19 Vaccine (4 - 2023-24 season) 11/01/2021   Medicare Annual Wellness (AWV)  02/26/2022   OPHTHALMOLOGY EXAM  05/09/2022 (Originally 08/29/2021)   Diabetic kidney evaluation - Urine ACR  05/11/2022   HEMOGLOBIN A1C  08/13/2022   DEXA SCAN  08/17/2022   Diabetic kidney evaluation - eGFR measurement  02/12/2023   FOOT EXAM  02/12/2023   MAMMOGRAM  06/21/2023   DTaP/Tdap/Td (2 - Td or Tdap) 08/12/2025   COLONOSCOPY (Pts 45-73yr Insurance coverage will need to be confirmed)  07/23/2027   Pneumonia Vaccine 69 Years old  Completed   INFLUENZA VACCINE  Completed   Hepatitis C Screening  Completed   Zoster Vaccines- Shingrix  Completed   HPV VACCINES  Aged Out    Cancer Screenings:  Colorectal Screening: Completed Colonoscopy 07/22/2017. Repeat every 10 years  Mammogram: Completed 06/20/21. Repeat every year  Bone Density: Completed 08/16/2012. Results reflect NORMAL, Repeat every 10 years.   Lung Cancer Screening: (Low Dose CT Chest recommended if Age 69-80years, 30 pack-year currently smoking OR have quit w/in 15years.) does not qualify.     Additional Screening:  Hepatitis C Screening: does qualify; Completed 05/19/2018  Vision Screening: Recommended annual ophthalmology exams for early detection of glaucoma and other disorders of the eye. Is the patient up to date with their annual eye exam?  Yes  Who is the provider or what is the name of the office in which the pt attends annual eye exams? [facility]   Dental Screening: Recommended annual dental exams for proper oral hygiene  Community Resource Referral:  CRR required this visit?  No       Plan:  I have personally reviewed and addressed the Medicare Annual Wellness questionnaire and have noted the following in the patient's chart:  A. Medical and social history B. Use of alcohol, tobacco or illicit drugs  C. Current medications and  supplements D. Functional ability and status E.  Nutritional status F.  Physical activity G. Advance directives H. List of other physicians I.  Hospitalizations, surgeries, and ER visits in previous 12 months J.  [place] hearing and vision if needed, cognitive and depression L. Referrals and appointments   In addition, I have reviewed and discussed with patient certain preventive protocols, quality metrics, and best practice recommendations. A written personalized care plan for preventive services as well as general preventive health recommendations were provided to patient.  Signed,    ETalitha Givens MHS, PA-C [facility]

## 2022-03-25 ENCOUNTER — Telehealth: Payer: Self-pay | Admitting: Family Medicine

## 2022-03-25 NOTE — Telephone Encounter (Unsigned)
Copied from [place] [id number]. Topic: General - Inquiry >> Mar 25, 2022  3:22 PM Penni Bombard wrote: Reason for CRM: pt called saying the [place] is 300.00 at the pharmacy and she can not afford it.  Please advise.  [phone]

## 2022-03-26 ENCOUNTER — Other Ambulatory Visit: Payer: Self-pay | Admitting: Family Medicine

## 2022-03-26 MED ORDER — METFORMIN HCL ER 750 MG PO TB24: 750.0000 mg | ORAL_TABLET | Freq: Every day | ORAL | 0 refills | Status: DC

## 2022-03-26 NOTE — Telephone Encounter (Signed)
Pharmacy correct.

## 2022-03-26 NOTE — Telephone Encounter (Signed)
Called with no answer, left voicemail inquiring about deductible plan.

## 2022-03-28 ENCOUNTER — Encounter: Payer: Self-pay | Admitting: Internal Medicine

## 2022-03-28 ENCOUNTER — Ambulatory Visit (INDEPENDENT_AMBULATORY_CARE_PROVIDER_SITE_OTHER): Payer: Medicare PPO | Admitting: Internal Medicine

## 2022-03-28 VITALS — BP 140/82 | HR 96 | Temp 98.3°F | Resp 16 | Ht 63.0 in | Wt 147.2 lb

## 2022-03-28 DIAGNOSIS — L509 Urticaria, unspecified: Secondary | ICD-10-CM

## 2022-03-28 MED ORDER — CETIRIZINE HCL 10 MG PO TABS: 10.0000 mg | ORAL_TABLET | Freq: Every day | ORAL | 11 refills | Status: DC

## 2022-03-28 MED ORDER — HYDROCORTISONE 1 % EX OINT: 1.0000 | TOPICAL_OINTMENT | Freq: Two times a day (BID) | CUTANEOUS | 0 refills | Status: DC

## 2022-03-28 NOTE — Patient Instructions (Addendum)
It was great seeing you today!  Plan discussed at today's visit: -Use hydrocortisone cream and Bendryl cream on the lesions -Start taking Zyrtec in the day and Benadryl in the morning. Can also take Pepcid for itching as well   Follow up in: as needed   Take care and let us know if you have any questions or concerns prior to your next visit.  Dr. Rosana Berger

## 2022-03-28 NOTE — Progress Notes (Signed)
   Acute Office Visit  Subjective:     Patient ID: Laurie Daniels, female    DOB: Jul 17, 1955, 67 y.o.   MRN: [id number]  Chief Complaint  Patient presents with   Rash    Yesterday on left arm itchy    Rash Pertinent negatives include no fever.   Patient is in today for rash.   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  Skin:  Positive for itching and rash.        Objective:    BP (!) 140/82   Pulse 96   Temp 98.3 F (36.8 C) (Oral)   Resp 16   Ht 5\' 3"  (1.6 m)   Wt 147 lb 3.2 oz (66.8 kg)   SpO2 98%   BMI 26.08 kg/m  BP Readings from Last 3 Encounters:  03/28/22 (!) 140/82  02/11/22 128/72  10/11/21 136/74   Wt Readings from Last 3 Encounters:  03/28/22 147 lb 3.2 oz (66.8 kg)  02/11/22 149 lb (67.6 kg)  10/11/21 154 lb 8 oz (70.1 kg)      Physical Exam Constitutional:      Appearance: Normal appearance.  HENT:     Head: Normocephalic and atraumatic.  Eyes:     Conjunctiva/sclera: Conjunctivae normal.  Cardiovascular:     Rate and Rhythm: Normal rate and regular rhythm.  Pulmonary:     Effort: Pulmonary effort is normal.     Breath sounds: Normal breath sounds.  Skin:    General: Skin is warm and dry.     Findings: Rash present. Rash is urticarial.     Comments: Large erythematous welts on left underarm spreading down to hand and behind neck.   Neurological:     General: No focal deficit present.     Mental  Status: She is alert. Mental status is at baseline.  Psychiatric:        Mood and Affect: Mood normal.        Behavior: Behavior normal.     No results found for any visits on 03/28/22.      Assessment & Plan:   1. Urticaria: Appears to be allergic welts as a reaction to some kind of bug bite. Not consistent with bedbugs or scabies. Will treat as allergic with anti-histamines and steroid cream. Follow up if symptoms worsen or fail to improve.   - hydrocortisone 1 % ointment; Apply 1 Application topically 2 (two) times daily.  Dispense: 30 g; Refill: 0 - cetirizine (ZYRTEC) 10 MG tablet; Take 1 tablet (10 mg total) by mouth daily.  Dispense: 30 tablet; Refill: 11   Return if symptoms worsen or fail to improve.  Teodora Medici, DO

## 2022-05-27 ENCOUNTER — Other Ambulatory Visit: Payer: Self-pay | Admitting: Family Medicine

## 2022-05-27 DIAGNOSIS — Z1231 Encounter for screening mammogram for malignant neoplasm of breast: Secondary | ICD-10-CM

## 2022-06-23 ENCOUNTER — Ambulatory Visit
Admission: RE | Admit: 2022-06-23 | Discharge: 2022-06-23 | Disposition: A | Discharge status: Home / Self Care | Payer: Medicare PPO | Source: Ambulatory Visit | Attending: Family Medicine | Admitting: Family Medicine

## 2022-06-23 DIAGNOSIS — Z1231 Encounter for screening mammogram for malignant neoplasm of breast: Secondary | ICD-10-CM | POA: Insufficient documentation

## 2022-07-02 ENCOUNTER — Other Ambulatory Visit: Payer: Self-pay | Admitting: Family Medicine

## 2022-07-02 DIAGNOSIS — I700 Atherosclerosis of aorta: Secondary | ICD-10-CM

## 2022-07-02 DIAGNOSIS — E1169 Type 2 diabetes mellitus with other specified complication: Secondary | ICD-10-CM

## 2022-08-05 IMAGING — MG MM DIGITAL SCREENING BILAT W/ TOMO AND CAD
8 series · 8 of 24 positions shown · non-contrast
Comparison: Previous exam(s).

CLINICAL DATA: Screening.

EXAM:
DIGITAL SCREENING BILATERAL MAMMOGRAM WITH TOMOSYNTHESIS AND CAD
TECHNIQUE: Bilateral screening digital craniocaudal and mediolateral oblique
mammograms were obtained. Bilateral screening digital breast
tomosynthesis was performed. The images were evaluated with
computer-aided detection.

[R MLO synth-2D]
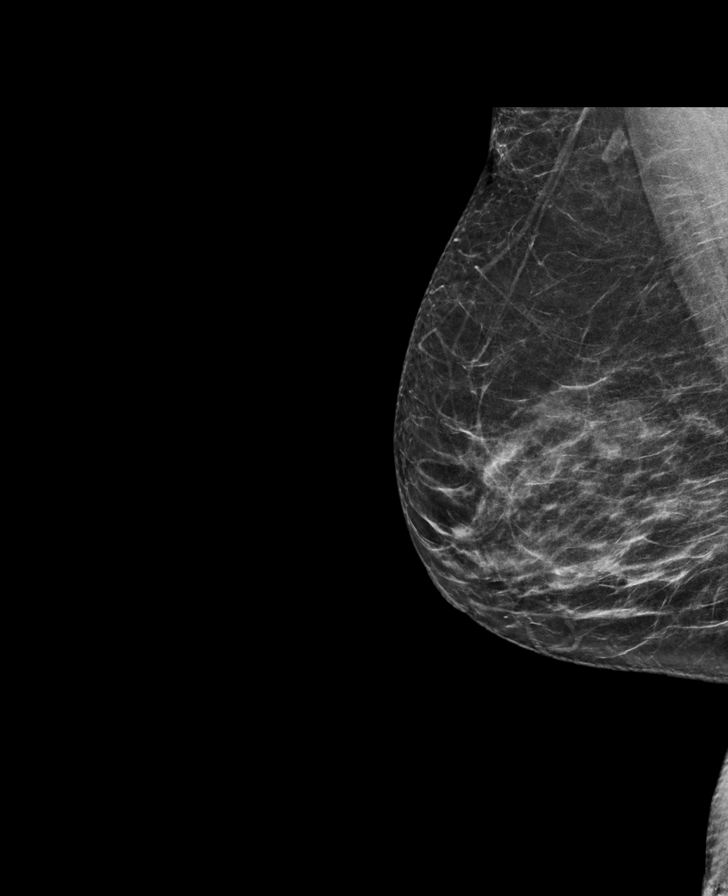

[R CC synth-2D]
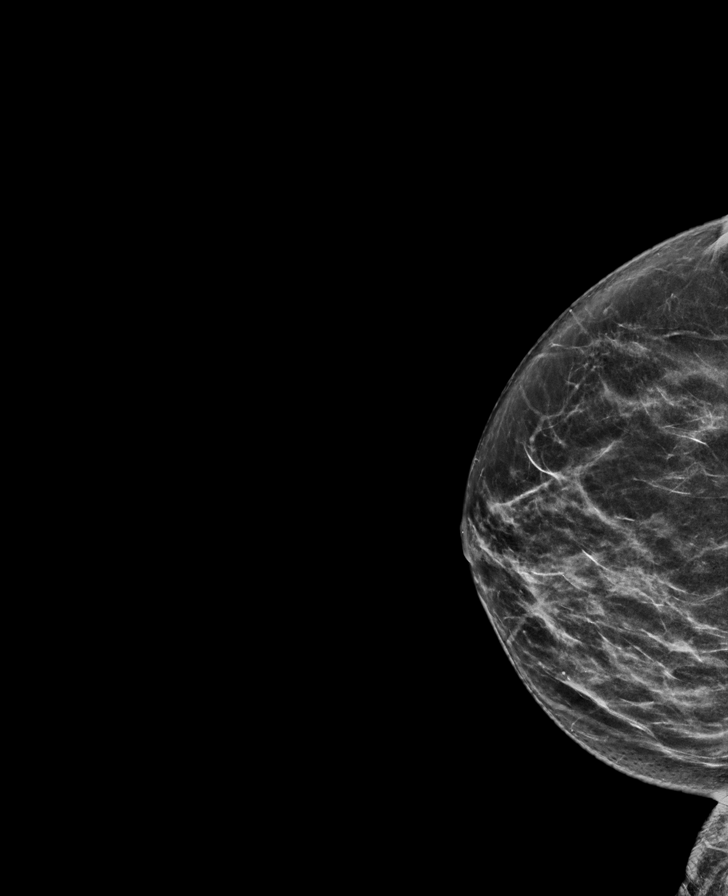

[L MLO synth-2D]
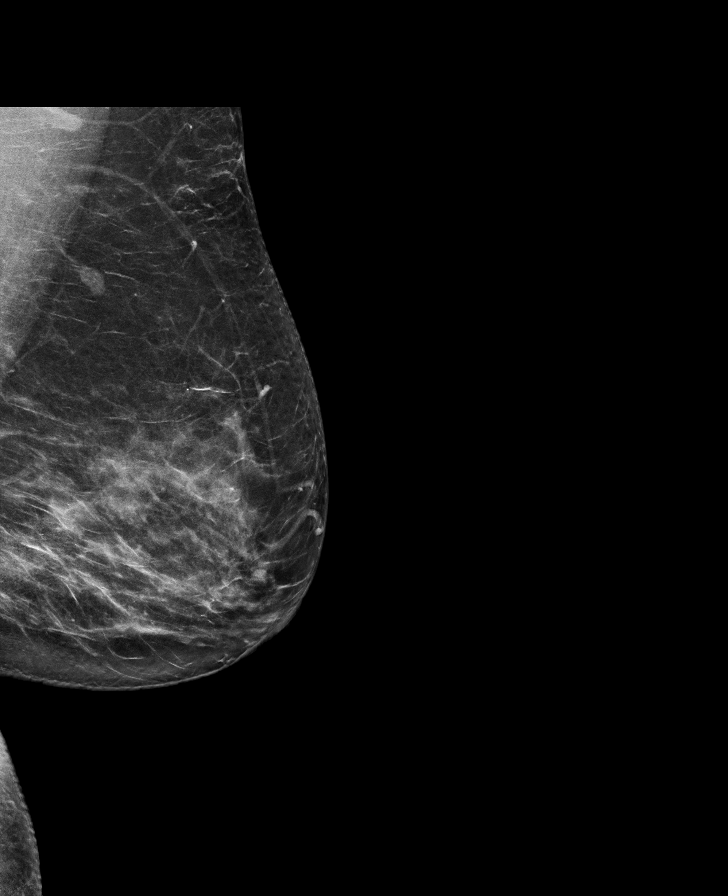

[L CC synth-2D]
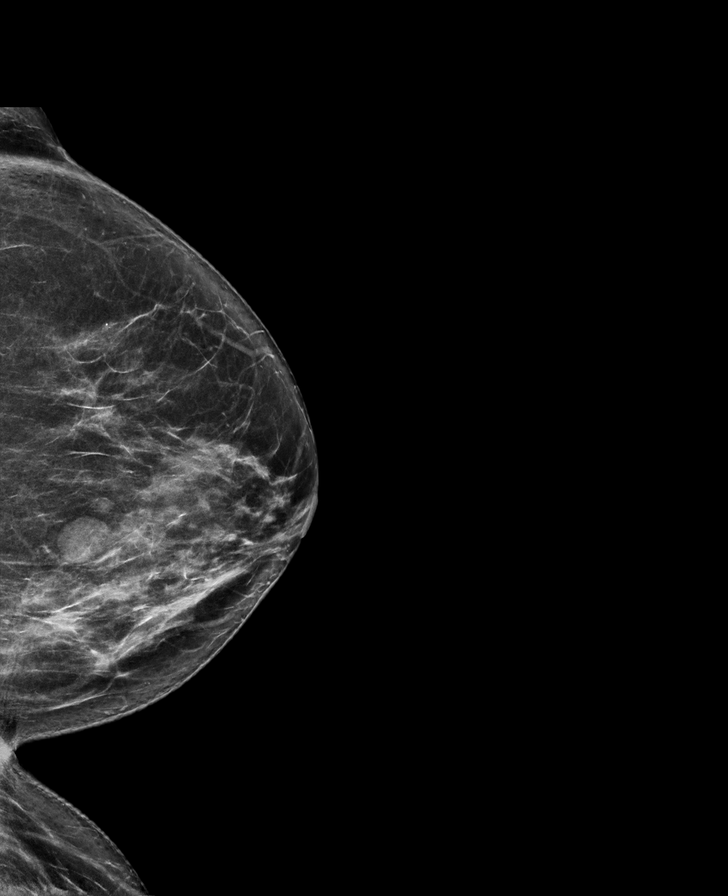

[R CC tomo · tomo slice 33/64.0]
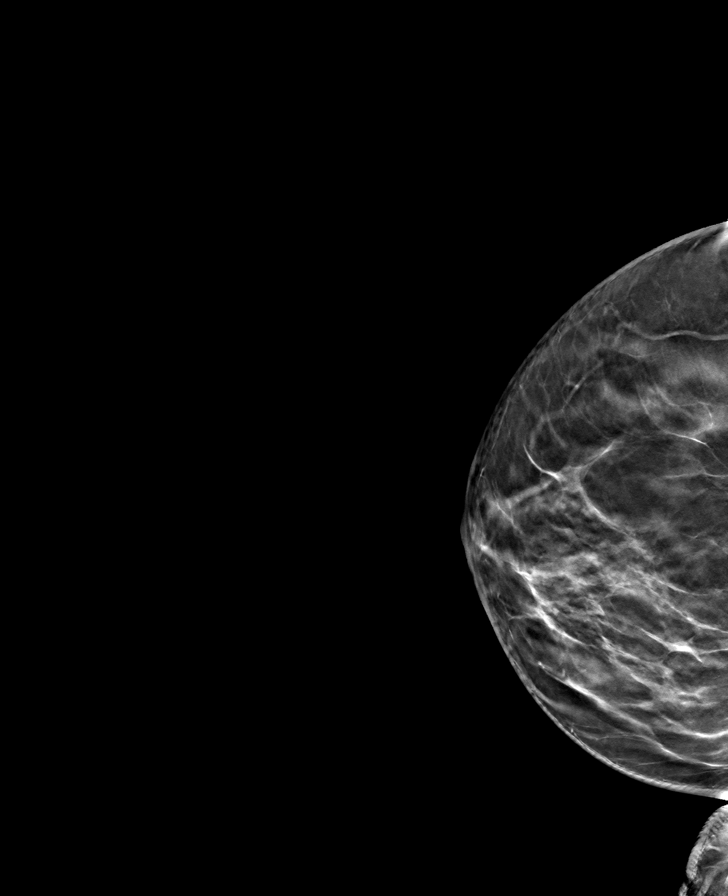

[L MLO tomo · tomo slice 39/77.0]
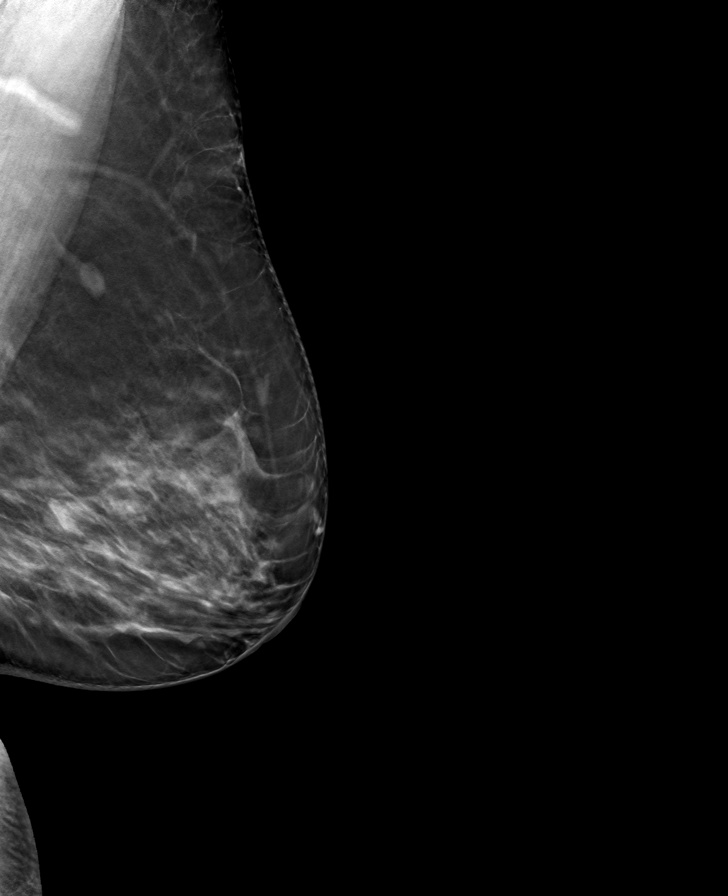

[L CC tomo · tomo slice 35/68.0]
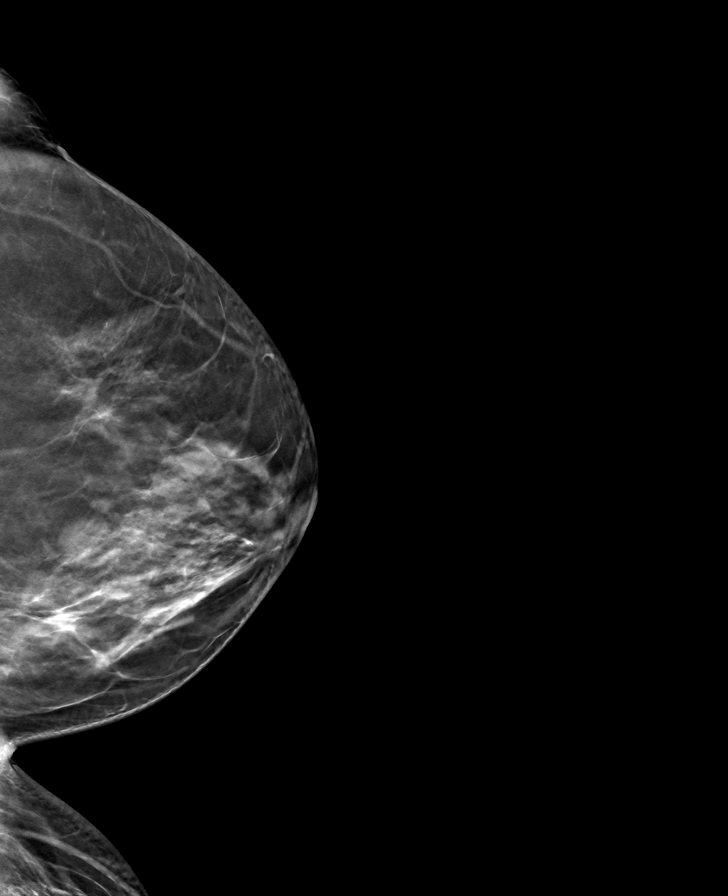

[R MLO tomo · tomo slice 35/70.0]
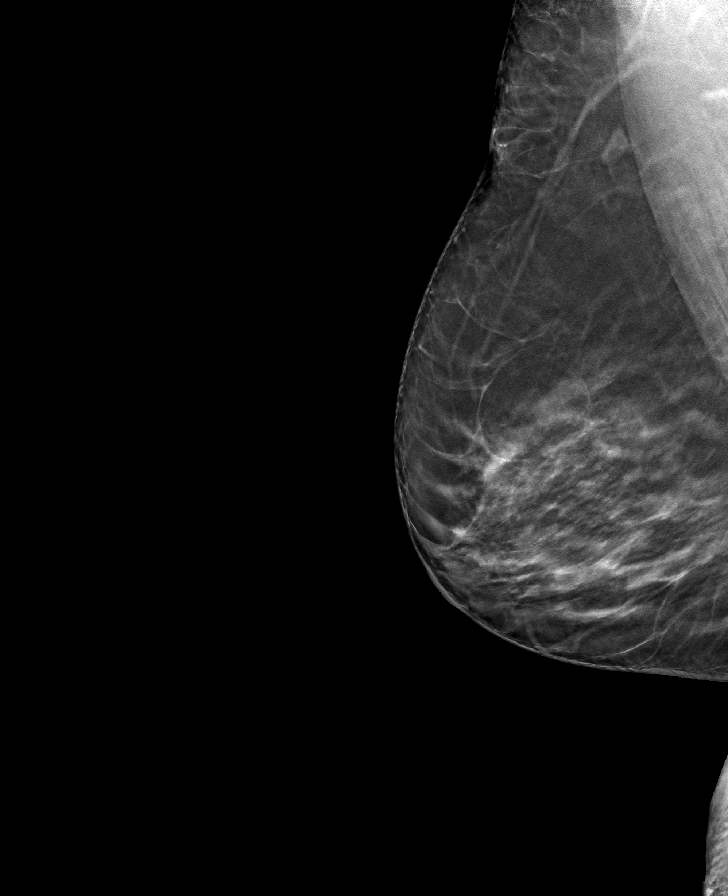

[8 of 24 positions shown; findings below may reference images not displayed]

ACR Breast Density Category c: The breast tissue is heterogeneously
dense, which may obscure small masses.
FINDINGS: There are no findings suspicious for malignancy.
IMPRESSION: No mammographic evidence of malignancy. A result letter of this
screening mammogram will be mailed directly to the patient.

RECOMMENDATION:
Screening mammogram in one year. (Code:Q3-W-BC3)

BI-RADS CATEGORY  1: Negative.
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
## 2022-08-18 ENCOUNTER — Ambulatory Visit (INDEPENDENT_AMBULATORY_CARE_PROVIDER_SITE_OTHER): Payer: Medicare HMO | Admitting: Family Medicine

## 2022-08-18 ENCOUNTER — Encounter: Payer: Self-pay | Admitting: Family Medicine

## 2022-08-18 VITALS — BP 124/84 | HR 74 | Temp 98.1°F | Resp 14 | Ht 63.0 in | Wt 145.8 lb

## 2022-08-18 DIAGNOSIS — E1129 Type 2 diabetes mellitus with other diabetic kidney complication: Secondary | ICD-10-CM

## 2022-08-18 DIAGNOSIS — E538 Deficiency of other specified B group vitamins: Secondary | ICD-10-CM

## 2022-08-18 DIAGNOSIS — E213 Hyperparathyroidism, unspecified: Secondary | ICD-10-CM

## 2022-08-18 DIAGNOSIS — E1169 Type 2 diabetes mellitus with other specified complication: Secondary | ICD-10-CM

## 2022-08-18 DIAGNOSIS — I700 Atherosclerosis of aorta: Secondary | ICD-10-CM | POA: Diagnosis not present

## 2022-08-18 DIAGNOSIS — E785 Hyperlipidemia, unspecified: Secondary | ICD-10-CM | POA: Diagnosis not present

## 2022-08-18 DIAGNOSIS — Z7984 Long term (current) use of oral hypoglycemic drugs: Secondary | ICD-10-CM | POA: Diagnosis not present

## 2022-08-18 DIAGNOSIS — Z13820 Encounter for screening for osteoporosis: Secondary | ICD-10-CM

## 2022-08-18 DIAGNOSIS — F5101 Primary insomnia: Secondary | ICD-10-CM

## 2022-08-18 DIAGNOSIS — F325 Major depressive disorder, single episode, in full remission: Secondary | ICD-10-CM | POA: Diagnosis not present

## 2022-08-18 DIAGNOSIS — E042 Nontoxic multinodular goiter: Secondary | ICD-10-CM

## 2022-08-18 DIAGNOSIS — R809 Proteinuria, unspecified: Secondary | ICD-10-CM

## 2022-08-18 LAB — POCT GLYCOSYLATED HEMOGLOBIN (HGB A1C): Hemoglobin A1C: 6.7 % — AB (ref 4.0–5.6)

## 2022-08-18 MED ORDER — JANUVIA 100 MG PO TABS: 100.0000 mg | ORAL_TABLET | Freq: Every day | ORAL | 1 refills | Status: DC

## 2022-08-18 MED ORDER — MIRTAZAPINE 15 MG PO TABS: ORAL_TABLET | ORAL | 1 refills | Status: DC

## 2022-08-18 MED ORDER — LOSARTAN POTASSIUM 50 MG PO TABS: 50.0000 mg | ORAL_TABLET | Freq: Every day | ORAL | 1 refills | Status: DC

## 2022-08-18 MED ORDER — ATORVASTATIN CALCIUM 40 MG PO TABS: 40.0000 mg | ORAL_TABLET | Freq: Every day | ORAL | 1 refills | Status: DC

## 2022-08-18 NOTE — Patient Instructions (Addendum)
Raelene Bott, MD (Attending) DM: [phone] (Work) [phone] (Fax) [phone] [facility] [place], [place] [address] Endocrinology  You must call to schedule a follow up - you need repeat US thyroid to see if nodule is growing and if you need a biopsy

## 2022-08-19 LAB — MICROALBUMIN / CREATININE URINE RATIO
Creatinine, Urine: 130 mg/dL (ref 20–275)
Microalb Creat Ratio: 16 mg/g creat (ref <30)
Microalb, Ur: 2.1 mg/dL

## 2022-09-08 DIAGNOSIS — H5202 Hypermetropia, left eye: Secondary | ICD-10-CM | POA: Diagnosis not present

## 2022-09-08 DIAGNOSIS — N3000 Acute cystitis without hematuria: Secondary | ICD-10-CM | POA: Diagnosis not present

## 2022-09-08 DIAGNOSIS — I10 Essential (primary) hypertension: Secondary | ICD-10-CM | POA: Diagnosis not present

## 2022-09-08 DIAGNOSIS — Z6826 Body mass index (BMI) 26.0-26.9, adult: Secondary | ICD-10-CM | POA: Diagnosis not present

## 2022-09-08 LAB — HM DIABETES EYE EXAM

## 2022-12-30 ENCOUNTER — Ambulatory Visit
Admission: RE | Admit: 2022-12-30 | Discharge: 2022-12-30 | Disposition: A | Discharge status: Home / Self Care | Payer: Medicare HMO | Source: Ambulatory Visit | Attending: Family Medicine | Admitting: Family Medicine

## 2022-12-30 DIAGNOSIS — E119 Type 2 diabetes mellitus without complications: Secondary | ICD-10-CM | POA: Insufficient documentation

## 2022-12-30 DIAGNOSIS — Z780 Asymptomatic menopausal state: Secondary | ICD-10-CM | POA: Diagnosis not present

## 2022-12-30 DIAGNOSIS — M8589 Other specified disorders of bone density and structure, multiple sites: Secondary | ICD-10-CM | POA: Insufficient documentation

## 2022-12-30 DIAGNOSIS — Z13820 Encounter for screening for osteoporosis: Secondary | ICD-10-CM | POA: Diagnosis not present

## 2023-02-06 NOTE — Progress Notes (Signed)
Name: Laurie Daniels   MRN: [id number]    DOB: 05/13/1955   Date:02/17/2023       Progress Note  Subjective  Chief Complaint  Chief Complaint  Patient presents with   Medical Management of Chronic Issues    HPI  Discussed the use of AI scribe software for clinical note transcription with the patient, who gave verbal consent to proceed.  History of Present Illness   The patient presents with a chief complaint of excessive flatulence, described as passing gas all day long, which has been ongoing for approximately five months. The patient denies any associated bloating or burping. There have been no changes in diet or bowel habits, with bowel movements described as soft and occurring every other day. The patient denies any straining or feelings of incomplete evacuation. she used to take Linzess for constipation but states lately only takes otc supplements since Linzess too costly   In addition to the gastrointestinal symptoms, the patient reports a recent decline in mood, particularly feeling down and experiencing a lack of energy. The patient also reports difficulty concentrating and changes in sleep and appetite patterns. These symptoms have been particularly noticeable in the weeks leading up to the holiday season. The patient has a history of major depression, which was previously well-controlled and has tried Celexa and Lexapro in the past and is willing to resume medication for MDD recurrent.  The patient also mentions a concern about memory issues, describing instances of forgetfulness and difficulty recalling why certain tasks were initiated. However, the patient is aware of these memory lapses, suggesting that this may be more related to the current depressive episode rather than a sign of dementia.  The patient has a history of diabetes, hyperparathyroidism, and a nontoxic multinodular goiter. The patient's diabetes is well-controlled, with a recent HbA1c of 6.6. The patient has not  been regularly following up with an endocrinologist for the hyperparathyroidism and goiter, despite having multiple thyroid nodules. The patient is currently on atorvastatin for atherosclerosis of the aorta, losartan for hypertension, and mirtazapine for sleep.         Patient Active Problem List   Diagnosis Date Noted   Osteoarthritis of right knee 10/23/2021   Pain in joint of right knee 10/23/2021   Hyperparathyroidism (HCC) 05/10/2021   Dyslipidemia associated with type 2 diabetes mellitus (HCC) 05/10/2021   Chronic constipation 01/30/2016   Allergic rhinitis 08/13/2015   Carpal tunnel syndrome 08/13/2015   Atherosclerosis of aorta (HCC) 08/13/2015   Nocturia 08/13/2015   Migraine without aura and responsive to treatment 08/13/2015   Thyroid goiter 08/13/2015   Diabetes mellitus with microalbuminuria (HCC) 03/05/2015   Insomnia 10/12/2014   Hyperlipidemia 10/12/2014    Past Surgical History:  Procedure Laterality Date   CATARACT EXTRACTION Right 08/31/2017   CESAREAN SECTION     COLONOSCOPY  06/17/2007   Dr Servando Snare   COLONOSCOPY WITH PROPOFOL N/A 07/22/2017   Procedure: COLONOSCOPY WITH PROPOFOL;  Surgeon: Earline Mayotte, MD;  [facility] ENDOSCOPY;  Service: Endoscopy;  Laterality: N/A;   TUBAL LIGATION     vitrectomy Right 07/10/2016   done at [facility] History  Problem Relation Age of Onset   Hypertension Mother    Diabetes Mother    Lung cancer Father    Colon cancer Neg Hx     Social History   Tobacco Use   Smoking status: Never   Smokeless tobacco: Never  Substance Use Topics   Alcohol use: No  Alcohol/week: 0.0 standard drinks of alcohol     Current Outpatient Medications:    aspirin 81 MG tablet, Take 81 mg by mouth daily., Disp: , Rfl:    atorvastatin (LIPITOR) 40 MG tablet, Take 1 tablet (40 mg total) by mouth daily., Disp: 90 tablet, Rfl: 1   hydrocortisone 1 % ointment, Apply 1 Application topically 2 (two) times daily., Disp: 30 g,  Rfl: 0   JANUVIA 100 MG tablet, Take 1 tablet (100 mg total) by mouth daily., Disp: 90 tablet, Rfl: 1   losartan (COZAAR) 50 MG tablet, Take 1 tablet (50 mg total) by mouth daily., Disp: 90 tablet, Rfl: 1   mirtazapine (REMERON) 15 MG tablet, TAKE 1 TABLET BY MOUTH EVERYDAY AT BEDTIME, Disp: 90 tablet, Rfl: 1   vitamin B-12 (CYANOCOBALAMIN) 100 MCG tablet, Take 100 mcg by mouth daily., Disp: , Rfl:   No Known Allergies  I personally reviewed active problem list, medication list, allergies, family history with the patient/caregiver today.   ROS  Ten systems reviewed and is negative except as mentioned in HPI    Objective  Vitals:   02/17/23 1504  BP: 114/72  Pulse: 92  Resp: 14  Temp: 98.2 F (36.8 C)  TempSrc: Oral  SpO2: 99%  Weight: 146 lb 3.2 oz (66.3 kg)  Height: 5\' 3"  (1.6 m)    Body mass index is 25.9 kg/m.  Physical Exam  Constitutional: Patient appears well-developed and well-nourished.  No distress.  HEENT: head atraumatic, normocephalic, pupils equal and reactive to light, neck supple Cardiovascular: Normal rate, regular rhythm and normal heart sounds.  No murmur heard. No BLE edema. Pulmonary/Chest: Effort normal and breath sounds normal. No respiratory distress. Abdominal: Soft.  There is no tenderness. Psychiatric: Patient has a normal mood and affect. behavior is normal. Judgment and thought content normal.   Recent Results (from the past 2160 hours)  POCT HgB A1C     Status: Abnormal   Collection Time: 02/17/23  3:07 PM  Result Value Ref Range   Hemoglobin A1C 6.6 (A) 4.0 - 5.6 %   HbA1c POC (<> result, manual entry)     HbA1c, POC (prediabetic range)     HbA1c, POC (controlled diabetic range)      Diabetic Foot Exam - Simple   No data filed     PHQ2/9:    02/17/2023    3:06 PM 08/18/2022    1:52 PM 03/28/2022    8:03 AM 03/04/2022   12:49 PM 02/11/2022    3:37 PM  Depression screen PHQ 2/9  Decreased Interest 1 0 0 0 0  Down, Depressed,  Hopeless 1 0 0 0 0  PHQ - 2 Score 2 0 0 0 0  Altered sleeping 2 2 0 0 0  Tired, decreased energy 3 2 0 0 0  Change in appetite 1 0 0 0 0  Feeling bad or failure about yourself  0 0 0 0 0  Trouble concentrating 1 1 0 0 0  Moving slowly or fidgety/restless 0 0 0 0 0  Suicidal thoughts 0 0 0 0 0  PHQ-9 Score 9 5 0 0 0  Difficult doing work/chores Very difficult Not difficult at all Not difficult at all Not difficult at all     phq 9 is positive   Fall Risk:    02/17/2023    3:06 PM 08/18/2022    1:51 PM 03/28/2022    8:03 AM 03/04/2022   12:49 PM 02/11/2022    3:37  PM  Fall Risk   Falls in the past year? 0 0 0 0 0  Number falls in past yr:   0 0   Injury with Fall?   0 0   Risk for fall due to : No Fall Risks No Fall Risks No Fall Risks No Fall Risks No Fall Risks  Follow up Falls prevention discussed Falls prevention discussed Falls prevention discussed;Education provided;Falls evaluation completed Falls prevention discussed;Education provided;Falls evaluation completed Falls prevention discussed;Falls evaluation completed;Education provided      Functional Status Survey: Is the patient deaf or have difficulty hearing?: No Does the patient have difficulty seeing, even when wearing glasses/contacts?: No Does the patient have difficulty concentrating, remembering, or making decisions?: No Does the patient have difficulty walking or climbing stairs?: No Does the patient have difficulty dressing or bathing?: No Does the patient have difficulty doing errands alone such as visiting a doctor's office or shopping?: No    Assessment & Plan   Assessment and Plan    Excessive Flatulence Chronic issue with excessive gas, no associated bloating or changes in bowel habits. No dietary triggers identified. Last colonoscopy in 2019 was normal. -Start Simethicone (Gas-X) three times a day. Prior authorization to be attempted for insurance coverage, otherwise patient advised to purchase  over-the-counter.  Depression Major recurrent Reports feeling down, lack of energy, overeating, and trouble concentrating. No suicidal ideation. History of major depression, currently on Mirtazapine for sleep. -Start Escitalopram (Lexapro) for depression. Follow-up in 4 months to assess response.  Diabetes Mellitus with associated dyslipidemia and HTN A1c 6.6, stable weight, no reported changes in appetite or bowel habits. -Continue current management/Januvia  Hyperparathyroidism and Multinodular Goiter No recent follow-up with endocrinologist, patient not interested in seeing specialist at this time. Explained importance of monitoring to rule out thyroid cancer -Order labs to monitor thyroid and parathyroid function.  General Health Maintenance -Order comprehensive panel, lipid panel, vitamin D, B12, and CBC. -Advise patient to bring in over-the-counter constipation medication for review at next visit. -Continue Atorvastatin for atherosclerosis of the aorta and high cholesterol. -Continue Losartan for hypertension. -Continue Januvia for diabetes. -Continue Mirtazapine for sleep. -Advise patient to return for labs after Christmas. -Follow-up in 4 months.

## 2023-02-13 ENCOUNTER — Other Ambulatory Visit: Payer: Self-pay | Admitting: Family Medicine

## 2023-02-13 DIAGNOSIS — E1169 Type 2 diabetes mellitus with other specified complication: Secondary | ICD-10-CM

## 2023-02-13 DIAGNOSIS — I700 Atherosclerosis of aorta: Secondary | ICD-10-CM

## 2023-02-13 NOTE — Telephone Encounter (Signed)
Requested medication (s) are due for refill today: Yes  Requested medication (s) are on the active medication list: Yes  Last refill:  08/18/22  Future visit scheduled: Yes  Notes to clinic:  Unable to refill per protocol due to failed labs, no updated results.      Requested Prescriptions  Pending Prescriptions Disp Refills   atorvastatin (LIPITOR) 40 MG tablet [Pharmacy Med Name: Atorvastatin Calcium Oral Tablet 40 MG] 90 tablet 3    Sig: TAKE 1 TABLET EVERY DAY     Cardiovascular:  Antilipid - Statins Failed - 02/13/2023  4:07 PM      Failed - Lipid Panel in normal range within the last 12 months    Cholesterol  Date Value Ref Range Status  02/11/2022 161 <200 mg/dL Final  65/78/4696 295 0 - 200 mg/dL Final   Ldl Cholesterol, Calc  Date Value Ref Range Status  06/13/2011 109 (H) 0 - 100 mg/dL Final   LDL Cholesterol (Calc)  Date Value Ref Range Status  02/11/2022 79 mg/dL (calc) Final    Comment:    Reference range: <100 . Desirable range <100 mg/dL for primary prevention;   <70 mg/dL for patients with CHD or diabetic patients  with > or = 2 CHD risk factors. Marland Kitchen LDL-C is now calculated using the Martin-Hopkins  calculation, which is a validated novel method providing  better accuracy than the Friedewald equation in the  estimation of LDL-C.  Horald Pollen et al. Lenox Ahr. 2841;324(40): 2061-2068  (http://education.[facility].com/faq/FAQ164)    HDL Cholesterol  Date Value Ref Range Status  06/13/2011 32 (L) 40 - 60 mg/dL Final   HDL  Date Value Ref Range Status  02/11/2022 63 > OR = 50 mg/dL Final   Triglycerides  Date Value Ref Range Status  02/11/2022 107 <150 mg/dL Final  12/28/2534 644 0 - 200 mg/dL Final         Passed - Patient is not pregnant      Passed - Valid encounter within last 12 months    Recent Outpatient Visits           5 months ago Dyslipidemia associated with type 2 diabetes mellitus [facility])   [facility]  Alba Cory, MD   10 months ago Urticaria   [facility] Margarita Mail, [place]   11 months ago Encounter for [employer] annual wellness exam   [facility] Mecum, Oswaldo Conroy, PA-C   1 year ago Dyslipidemia associated with type 2 diabetes mellitus [facility])   [facility] Alba Cory, MD   1 year ago Dyslipidemia associated with type 2 diabetes mellitus [facility])   [facility] Alba Cory, MD       Future Appointments             In 4 days Alba Cory, MD [facility], [facility]

## 2023-02-17 ENCOUNTER — Encounter: Payer: Self-pay | Admitting: Family Medicine

## 2023-02-17 ENCOUNTER — Ambulatory Visit (INDEPENDENT_AMBULATORY_CARE_PROVIDER_SITE_OTHER): Payer: Medicare HMO | Admitting: Family Medicine

## 2023-02-17 VITALS — BP 114/72 | HR 92 | Temp 98.2°F | Resp 14 | Ht 63.0 in | Wt 146.2 lb

## 2023-02-17 DIAGNOSIS — E1169 Type 2 diabetes mellitus with other specified complication: Secondary | ICD-10-CM | POA: Diagnosis not present

## 2023-02-17 DIAGNOSIS — K5904 Chronic idiopathic constipation: Secondary | ICD-10-CM | POA: Diagnosis not present

## 2023-02-17 DIAGNOSIS — E538 Deficiency of other specified B group vitamins: Secondary | ICD-10-CM

## 2023-02-17 DIAGNOSIS — R809 Proteinuria, unspecified: Secondary | ICD-10-CM

## 2023-02-17 DIAGNOSIS — I700 Atherosclerosis of aorta: Secondary | ICD-10-CM

## 2023-02-17 DIAGNOSIS — E785 Hyperlipidemia, unspecified: Secondary | ICD-10-CM

## 2023-02-17 DIAGNOSIS — G43009 Migraine without aura, not intractable, without status migrainosus: Secondary | ICD-10-CM | POA: Diagnosis not present

## 2023-02-17 DIAGNOSIS — Z79899 Other long term (current) drug therapy: Secondary | ICD-10-CM

## 2023-02-17 DIAGNOSIS — F325 Major depressive disorder, single episode, in full remission: Secondary | ICD-10-CM | POA: Diagnosis not present

## 2023-02-17 DIAGNOSIS — E213 Hyperparathyroidism, unspecified: Secondary | ICD-10-CM

## 2023-02-17 DIAGNOSIS — R143 Flatulence: Secondary | ICD-10-CM | POA: Diagnosis not present

## 2023-02-17 DIAGNOSIS — K219 Gastro-esophageal reflux disease without esophagitis: Secondary | ICD-10-CM

## 2023-02-17 DIAGNOSIS — E042 Nontoxic multinodular goiter: Secondary | ICD-10-CM

## 2023-02-17 DIAGNOSIS — E1129 Type 2 diabetes mellitus with other diabetic kidney complication: Secondary | ICD-10-CM | POA: Diagnosis not present

## 2023-02-17 LAB — POCT GLYCOSYLATED HEMOGLOBIN (HGB A1C): Hemoglobin A1C: 6.6 % — AB (ref 4.0–5.6)

## 2023-02-17 MED ORDER — SIMETHICONE 180 MG PO CAPS: 1.0000 | ORAL_CAPSULE | Freq: Three times a day (TID) | ORAL | 1 refills | Status: DC

## 2023-02-17 MED ORDER — ESCITALOPRAM OXALATE 10 MG PO TABS: 10.0000 mg | ORAL_TABLET | Freq: Every day | ORAL | 0 refills | Status: DC

## 2023-03-30 ENCOUNTER — Other Ambulatory Visit: Payer: Self-pay | Admitting: Family Medicine

## 2023-03-30 DIAGNOSIS — E1129 Type 2 diabetes mellitus with other diabetic kidney complication: Secondary | ICD-10-CM

## 2023-04-06 ENCOUNTER — Other Ambulatory Visit: Payer: Self-pay

## 2023-04-06 DIAGNOSIS — E785 Hyperlipidemia, unspecified: Secondary | ICD-10-CM

## 2023-04-06 DIAGNOSIS — I700 Atherosclerosis of aorta: Secondary | ICD-10-CM

## 2023-04-06 MED ORDER — ATORVASTATIN CALCIUM 40 MG PO TABS: 40.0000 mg | ORAL_TABLET | Freq: Every day | ORAL | 1 refills | Status: DC

## 2023-04-06 NOTE — Telephone Encounter (Signed)
OV on 02/2023

## 2023-05-13 ENCOUNTER — Encounter: Payer: Self-pay | Admitting: Family Medicine

## 2023-05-13 LAB — CBC WITH DIFFERENTIAL/PLATELET
Absolute Lymphocytes: 3620 {cells}/uL (ref 850–3900)
Absolute Monocytes: 445 {cells}/uL (ref 200–950)
Basophils Absolute: 42 {cells}/uL (ref 0–200)
Basophils Relative: 0.5 %
Eosinophils Absolute: 294 {cells}/uL (ref 15–500)
Eosinophils Relative: 3.5 %
HCT: 38 % (ref 35.0–45.0)
Hemoglobin: 12.2 g/dL (ref 11.7–15.5)
MCH: 28.6 pg (ref 27.0–33.0)
MCHC: 32.1 g/dL (ref 32.0–36.0)
MCV: 89 fL (ref 80.0–100.0)
MPV: 11.9 fL (ref 7.5–12.5)
Monocytes Relative: 5.3 %
Neutro Abs: 3998 {cells}/uL (ref 1500–7800)
Neutrophils Relative %: 47.6 %
Platelets: 239 10*3/uL (ref 140–400)
RBC: 4.27 10*6/uL (ref 3.80–5.10)
RDW: 12.7 % (ref 11.0–15.0)
Total Lymphocyte: 43.1 %
WBC: 8.4 10*3/uL (ref 3.8–10.8)

## 2023-05-13 LAB — COMPLETE METABOLIC PANEL WITH GFR
AG Ratio: 1.6 (calc) (ref 1.0–2.5)
ALT: 14 U/L (ref 6–29)
AST: 14 U/L (ref 10–35)
Albumin: 4.5 g/dL (ref 3.6–5.1)
Alkaline phosphatase (APISO): 99 U/L (ref 37–153)
BUN: 17 mg/dL (ref 7–25)
CO2: 32 mmol/L (ref 20–32)
Calcium: 10 mg/dL (ref 8.6–10.4)
Chloride: 103 mmol/L (ref 98–110)
Creat: 0.81 mg/dL (ref 0.50–1.05)
Globulin: 2.9 g/dL (ref 1.9–3.7)
Glucose, Bld: 107 mg/dL — ABNORMAL HIGH (ref 65–99)
Potassium: 4.3 mmol/L (ref 3.5–5.3)
Sodium: 141 mmol/L (ref 135–146)
Total Bilirubin: 0.4 mg/dL (ref 0.2–1.2)
Total Protein: 7.4 g/dL (ref 6.1–8.1)
eGFR: 79 mL/min/{1.73_m2} (ref >=60)

## 2023-05-13 LAB — B12 AND FOLATE PANEL
Folate: 22.9 ng/mL
Vitamin B-12: 611 pg/mL (ref 200–1100)

## 2023-05-13 LAB — LIPID PANEL
Cholesterol: 149 mg/dL (ref <200)
HDL: 62 mg/dL (ref >=50)
LDL Cholesterol (Calc): 65 mg/dL
Non-HDL Cholesterol (Calc): 87 mg/dL (ref <130)
Total CHOL/HDL Ratio: 2.4 (calc) (ref <5.0)
Triglycerides: 140 mg/dL (ref <150)

## 2023-05-13 LAB — VITAMIN D 25 HYDROXY (VIT D DEFICIENCY, FRACTURES): Vit D, 25-Hydroxy: 12 ng/mL — ABNORMAL LOW (ref 30–100)

## 2023-05-13 LAB — TSH: TSH: 0.88 m[IU]/L (ref 0.40–4.50)

## 2023-05-13 LAB — PARATHYROID HORMONE, INTACT (NO CA): PTH: 84 pg/mL — ABNORMAL HIGH (ref 16–77)

## 2023-05-22 ENCOUNTER — Encounter: Payer: Self-pay | Admitting: Internal Medicine

## 2023-05-22 ENCOUNTER — Ambulatory Visit (INDEPENDENT_AMBULATORY_CARE_PROVIDER_SITE_OTHER): Admitting: Internal Medicine

## 2023-05-22 ENCOUNTER — Ambulatory Visit: Payer: Self-pay | Admitting: *Deleted

## 2023-05-22 VITALS — BP 124/76 | HR 84 | Temp 98.0°F | Resp 18 | Ht 63.0 in | Wt 147.0 lb

## 2023-05-22 DIAGNOSIS — H6993 Unspecified Eustachian tube disorder, bilateral: Secondary | ICD-10-CM

## 2023-05-22 DIAGNOSIS — R42 Dizziness and giddiness: Secondary | ICD-10-CM

## 2023-05-22 MED ORDER — METHYLPREDNISOLONE 4 MG PO TBPK: ORAL_TABLET | ORAL | 0 refills | Status: DC

## 2023-05-22 NOTE — Patient Instructions (Signed)
 Eustachian Tube Dysfunction  Eustachian tube dysfunction refers to a condition in which a blockage develops in the narrow passage that connects the middle ear to the back of the nose (eustachian tube). The eustachian tube regulates air pressure in the middle ear by letting air move between the ear and nose. It also helps to drain fluid from the middle ear space. Eustachian tube dysfunction can affect one or both ears. When the eustachian tube does not function properly, air pressure, fluid, or both can build up in the middle ear. What are the causes? This condition occurs when the eustachian tube becomes blocked or cannot open normally. Common causes of this condition include: Ear infections. Colds and other infections that affect the nose, mouth, and throat (upper respiratory tract). Allergies. Irritation from cigarette smoke. Irritation from stomach acid coming up into the esophagus (gastroesophageal reflux). The esophagus is the part of the body that moves food from the mouth to the stomach. Sudden changes in air pressure, such as from descending in an airplane or scuba diving. Abnormal growths in the nose or throat, such as: Growths that line the nose (nasal polyps). Abnormal growth of cells (tumors). Enlarged tissue at the back of the throat (adenoids). What increases the risk? You are more likely to develop this condition if: You smoke. You are overweight. You are a child who has: Certain birth defects of the mouth, such as cleft palate. Large tonsils or adenoids. What are the signs or symptoms? Common symptoms of this condition include: A feeling of fullness in the ear. Ear pain. Clicking or popping noises in the ear. Ringing in the ear (tinnitus). Hearing loss. Loss of balance. Dizziness. Symptoms may get worse when the air pressure around you changes, such as when you travel to an area of high elevation, fly on an airplane, or go scuba diving. How is this diagnosed? This  condition may be diagnosed based on: Your symptoms. A physical exam of your ears, nose, and throat. Tests, such as those that measure: The movement of your eardrum. Your hearing (audiometry). How is this treated? Treatment depends on the cause and severity of your condition. In mild cases, you may relieve your symptoms by moving air into your ears. This is called "popping the ears." In more severe cases, or if you have symptoms of fluid in your ears, treatment may include: Medicines to relieve congestion (decongestants). Medicines that treat allergies (antihistamines). Nasal sprays or ear drops that contain medicines that reduce swelling (steroids). A procedure to drain the fluid in your eardrum. In this procedure, a small tube may be placed in the eardrum to: Drain the fluid. Restore the air in the middle ear space. A procedure to insert a balloon device through the nose to inflate the opening of the eustachian tube (balloon dilation). Follow these instructions at home: Lifestyle Do not do any of the following until your health care provider approves: Travel to high altitudes. Fly in airplanes. Work in a Estate agent or room. Scuba dive. Do not use any products that contain nicotine or tobacco. These products include cigarettes, chewing tobacco, and vaping devices, such as e-cigarettes. If you need help quitting, ask your health care provider. Keep your ears dry. Wear fitted earplugs during showering and bathing. Dry your ears completely after. General instructions Take over-the-counter and prescription medicines only as told by your health care provider. Use techniques to help pop your ears as recommended by your health care provider. These may include: Chewing gum. Yawning. Frequent, forceful swallowing.  Closing your mouth, holding your nose closed, and gently blowing as if you are trying to blow air out of your nose. Keep all follow-up visits. This is important. Contact a  health care provider if: Your symptoms do not go away after treatment. Your symptoms come back after treatment. You are unable to pop your ears. You have: A fever. Pain in your ear. Pain in your head or neck. Fluid draining from your ear. Your hearing suddenly changes. You become very dizzy. You lose your balance. Get help right away if: You have a sudden, severe increase in any of your symptoms. Summary Eustachian tube dysfunction refers to a condition in which a blockage develops in the eustachian tube. It can be caused by ear infections, allergies, inhaled irritants, or abnormal growths in the nose or throat. Symptoms may include ear pain or fullness, hearing loss, or ringing in the ears. Mild cases are treated with techniques to unblock the ears, such as yawning or chewing gum. More severe cases are treated with medicines or procedures. This information is not intended to replace advice given to you by your health care provider. Make sure you discuss any questions you have with your health care provider. Document Revised: 04/30/2020 Document Reviewed: 04/30/2020 Elsevier Patient Education  2024 [employer].

## 2023-05-22 NOTE — Progress Notes (Signed)
 Acute Office Visit  Subjective:     Patient ID: Laurie Daniels, female    DOB: 1955-07-04, 68 y.o.   MRN: [id number]  Chief Complaint  Patient presents with   Headache    And dizziness    HPI Patient is in today for headaches and dizziness for about 2 weeks.  She states she feels like she has a pressure type pain behind her eyes in her sinuses.  She is also noticing dizziness described as room spinning.  She denies ear pain, tinnitus or hearing changes.  She denies sinus pain, sore throat or congestion.  Her headache feels more behind her eyes and across her forehead.  She did take Tylenol and states this did not improve her symptoms.    Review of Systems  Constitutional:  Negative for chills and fever.  HENT:  Negative for congestion, ear pain, sinus pain, sore throat and tinnitus.   Respiratory:  Negative for cough.   Neurological:  Positive for dizziness and headaches.        Objective:    BP 124/76   Pulse 84   Temp 98 F (36.7 C)   Resp 18   Ht 5\' 3"  (1.6 m)   Wt 147 lb (66.7 kg)   SpO2 97%   BMI 26.04 kg/m  BP Readings from Last 3 Encounters:  05/22/23 124/76  02/17/23 114/72  08/18/22 124/84   Wt Readings from Last 3 Encounters:  05/22/23 147 lb (66.7 kg)  02/17/23 146 lb 3.2 oz (66.3 kg)  08/18/22 145 lb 12.8 oz (66.1 kg)      Physical Exam Constitutional:      Appearance: Normal appearance.  HENT:     Head: Normocephalic and atraumatic.     Right Ear: Ear canal and external ear normal.     Left Ear: Ear canal and external ear normal.     Ears:     Comments: Bilateral eustachian tube dysfunction and fluid present behind both TMs    Nose: Nose normal.     Mouth/Throat:     Mouth: Mucous membranes are moist.     Pharynx: Oropharynx is clear.  Eyes:     Extraocular Movements: Extraocular movements intact.     Conjunctiva/sclera: Conjunctivae normal.     Pupils: Pupils are equal, round, and reactive to light.  Cardiovascular:     Rate and  Rhythm: Normal rate and regular rhythm.  Pulmonary:     Effort: Pulmonary effort is normal.     Breath sounds: Normal breath sounds.  Skin:    General: Skin is warm and dry.  Neurological:     General: No focal deficit present.     Mental Status: She is alert. Mental status is at baseline.  Psychiatric:        Mood and Affect: Mood normal.        Behavior: Behavior normal.     No results found for any visits on 05/22/23.      Assessment & Plan:   1. Eustachian tube dysfunction, bilateral (Primary)/Vertigo: Patient with fluid behind both tympanic membranes consistent with eustachian tube dysfunction.  Discussed this may be due to viral illness or environmental allergies.  Recommend nasal steroids, however patient states she cannot tolerate this.  Will prescribe short course of oral steroids instead.  Discussed potential side effects with this medication and also recommend starting an over-the-counter antihistamine should symptoms persist.  - methylPREDNISolone (MEDROL DOSEPAK) 4 MG TBPK tablet; Use as directed.  Dispense: 21 each;  Refill: 0   Return if symptoms worsen or fail to improve.  Margarita Mail, DO

## 2023-05-22 NOTE — Telephone Encounter (Signed)
  Chief Complaint: headaches , blurred vision at times.  Symptoms: headaches "all over". Feels like "floating". Some dizziness. Not drinking water as she should. Bending over causes dizziness. Pain level worse in am and sometimes after breakfast. Takes sleeping pills to sleep. Headache dull. Comes and goes. Can still work Frequency: 3 weeks ago  Pertinent Negatives: Patient denies sever pian no chest pain no difficulty breathing reported. No weakness on either side of body no N/T. Disposition: [] ED /[] Urgent Care (no appt availability in office) / [x] Appointment(In office/virtual)/ []  [facility]/ [] Home Care/ [] Refused Recommended Disposition /[] [facility]/ []  Follow-up with PCP Additional Notes:   Scheduled appt today with another provider in office due to PCP not available until Monday and patient unable to get off work Monday . Recommended if sx worsen call back or go to ED .     Copied from CRM [id number]. Topic: Clinical - Red Word Triage >> May 22, 2023  9:14 AM Izetta Dakin wrote: [facility] that prompted transfer to Nurse Triage: headache x 3weeks Reason for Disposition  [1] MILD-MODERATE headache AND [2] present > 72 hours  Answer Assessment - Initial Assessment Questions 1. LOCATION: "Where does it hurt?"      All over head  2. ONSET: "When did the headache start?" (Minutes, hours or days)      3 weeks  3. PATTERN: "Does the pain come and go, or has it been constant since it started?"     Comes and goes  4. SEVERITY: "How bad is the pain?" and "What does it keep you from doing?"  (e.g., Scale 1-10; mild, moderate, or severe)   - MILD (1-3): doesn't interfere with normal activities    - MODERATE (4-7): interferes with normal activities or awakens from sleep    - SEVERE (8-10): excruciating pain, unable to do any normal activities        Moderate but is trying to take sleeping pills  5. RECURRENT SYMPTOM: "Have you ever had headaches before?" If Yes, ask: "When  was the last time?" and "What happened that time?"      No  6. CAUSE: "What do you think is causing the headache?"     Not sure  7. MIGRAINE: "Have you been diagnosed with migraine headaches?" If Yes, ask: "Is this headache similar?"      na 8. HEAD INJURY: "Has there been any recent injury to the head?"      na 9. OTHER SYMPTOMS: "Do you have any other symptoms?" (fever, stiff neck, eye pain, sore throat, cold symptoms)     Headaches dull lightheaded 10. PREGNANCY: "Is there any chance you are pregnant?" "When was your last menstrual period?"       na  Protocols used: [facility]

## 2023-06-09 ENCOUNTER — Other Ambulatory Visit: Payer: Self-pay | Admitting: Family Medicine

## 2023-06-09 DIAGNOSIS — Z1231 Encounter for screening mammogram for malignant neoplasm of breast: Secondary | ICD-10-CM

## 2023-06-15 ENCOUNTER — Other Ambulatory Visit: Payer: Self-pay | Admitting: Family Medicine

## 2023-06-15 DIAGNOSIS — E1129 Type 2 diabetes mellitus with other diabetic kidney complication: Secondary | ICD-10-CM

## 2023-06-22 ENCOUNTER — Ambulatory Visit: Payer: Medicare HMO | Admitting: Family Medicine

## 2023-06-22 ENCOUNTER — Encounter: Payer: Self-pay | Admitting: Family Medicine

## 2023-06-22 ENCOUNTER — Telehealth: Payer: Self-pay | Admitting: Family Medicine

## 2023-06-22 VITALS — BP 118/62 | HR 87 | Temp 98.1°F | Resp 18 | Ht 63.0 in | Wt 148.6 lb

## 2023-06-22 DIAGNOSIS — I700 Atherosclerosis of aorta: Secondary | ICD-10-CM

## 2023-06-22 DIAGNOSIS — G43009 Migraine without aura, not intractable, without status migrainosus: Secondary | ICD-10-CM

## 2023-06-22 DIAGNOSIS — E1129 Type 2 diabetes mellitus with other diabetic kidney complication: Secondary | ICD-10-CM

## 2023-06-22 DIAGNOSIS — K219 Gastro-esophageal reflux disease without esophagitis: Secondary | ICD-10-CM

## 2023-06-22 DIAGNOSIS — E213 Hyperparathyroidism, unspecified: Secondary | ICD-10-CM | POA: Diagnosis not present

## 2023-06-22 DIAGNOSIS — R1319 Other dysphagia: Secondary | ICD-10-CM

## 2023-06-22 DIAGNOSIS — R809 Proteinuria, unspecified: Secondary | ICD-10-CM

## 2023-06-22 DIAGNOSIS — Z7984 Long term (current) use of oral hypoglycemic drugs: Secondary | ICD-10-CM

## 2023-06-22 DIAGNOSIS — E559 Vitamin D deficiency, unspecified: Secondary | ICD-10-CM

## 2023-06-22 DIAGNOSIS — E042 Nontoxic multinodular goiter: Secondary | ICD-10-CM

## 2023-06-22 DIAGNOSIS — F330 Major depressive disorder, recurrent, mild: Secondary | ICD-10-CM

## 2023-06-22 LAB — POCT URINALYSIS DIPSTICK: Color, UA: 7.1

## 2023-06-22 LAB — POCT GLYCOSYLATED HEMOGLOBIN (HGB A1C): Hemoglobin A1C: 7.1 % — AB (ref 4.0–5.6)

## 2023-06-22 MED ORDER — SERTRALINE HCL 50 MG PO TABS: 50.0000 mg | ORAL_TABLET | Freq: Every day | ORAL | 1 refills | Status: DC

## 2023-06-22 MED ORDER — VITAMIN D3 50 MCG (2000 UT) PO CAPS: 2000.0000 [IU] | ORAL_CAPSULE | Freq: Every day | ORAL | 1 refills | Status: DC

## 2023-06-22 MED ORDER — OMEPRAZOLE 20 MG PO CPDR
20.0000 mg | DELAYED_RELEASE_CAPSULE | Freq: Every day | ORAL | 1 refills | Status: AC
Start: 2023-06-22 — End: ?

## 2023-06-22 MED ORDER — LOSARTAN POTASSIUM 50 MG PO TABS: 50.0000 mg | ORAL_TABLET | Freq: Every day | ORAL | 1 refills | Status: DC

## 2023-06-22 NOTE — Progress Notes (Signed)
 Name: Laurie Daniels   MRN: [id number]    DOB: 1955/12/22   Date:06/22/2023       Progress Note  Subjective  Chief Complaint  Chief Complaint  Patient presents with   Medical Management of Chronic Issues   Discussed the use of AI scribe software for clinical note transcription with the patient, who gave verbal consent to proceed.  History of Present Illness Laurie Daniels is a 68 year old female with diabetes and hyperparathyroidism who presents for a four-month follow-up.  Diabetes type 2 is not as controlled , today A1C is up from 6.6 to  7.1%  , an increase from 6.6 in December. She does not check her A1c at home and consumes sweet beverages lately mostly cranberry/grape juice nightly. Although she has reduced dessert intake, she still eats white bread with jelly for breakfast . She takes Januvia  100 mg daily for diabetes and losartan  50 mg for kidney protection due to proteinuria. No significant hunger or thirst, but she experiences frequent urination.  She has a history of hyperparathyroidism and a multinodular goiter. Her thyroid  function is normal, but parathyroid  levels remain elevated. She is not currently under the care of an endocrinologist, lost to follow up with Laurie Daniels - not seen since 2022  She experiences worsening depression, with a PHQ-9 score of 6. She feels down daily, tired, and overeats. She stopped taking escitalopram , due to  nausea, but still takes  mirtazapine  for sleep. No suicidal thoughts.  She reports dysphagia, with food feeling stuck at the end of her esophagus and causes  pain. No heartburn or indigestion, but significant discomfort when eating.  She has primary osteoarthritis in her right knee, causing significant pain and swelling for the past 3-4 months. She uses Tylenol and Voltaren gel for relief but finds limited benefit. She has not pursued further orthopedic evaluation since last year.  No migraines lately  and she is not taking B12 supplements,  though her levels are normal. She is not on reflux medication currently.  Atherosclerosis of aorta: she has been taking atorvastatin  and denies side effects     Patient Active Problem List   Diagnosis Date Noted   Osteoarthritis of right knee 10/23/2021   Pain in joint of right knee 10/23/2021   Hyperparathyroidism (HCC) 05/10/2021   Dyslipidemia associated with type 2 diabetes mellitus (HCC) 05/10/2021   Chronic constipation 01/30/2016   Allergic rhinitis 08/13/2015   Carpal tunnel syndrome 08/13/2015   Atherosclerosis of aorta (HCC) 08/13/2015   Nocturia 08/13/2015   Migraine without aura and responsive to treatment 08/13/2015   Thyroid  goiter 08/13/2015   Diabetes mellitus with microalbuminuria (HCC) 03/05/2015   Insomnia 10/12/2014   Hyperlipidemia 10/12/2014    Past Surgical History:  Procedure Laterality Date   CATARACT EXTRACTION Right 08/31/2017   CESAREAN SECTION     COLONOSCOPY  06/17/2007   Dr Ole Berkeley   COLONOSCOPY WITH PROPOFOL  N/A 07/22/2017   Procedure: COLONOSCOPY WITH PROPOFOL ;  Surgeon: Marshall Skeeter, MD;  Location: [facility] ENDOSCOPY;  Service: Endoscopy;  Laterality: N/A;   TUBAL LIGATION     vitrectomy Right 07/10/2016   done at [facility] History  Problem Relation Age of Onset   Hypertension Mother    Diabetes Mother    Lung cancer Father    Colon cancer Neg Hx     Social History   Tobacco Use   Smoking status: Never   Smokeless tobacco: Never  Substance Use Topics   Alcohol use: No  Alcohol/week: 0.0 standard drinks of alcohol     Current Outpatient Medications:    aspirin 81 MG tablet, Take 81 mg by mouth daily., Disp: , Rfl:    atorvastatin  (LIPITOR) 40 MG tablet, Take 1 tablet (40 mg total) by mouth daily., Disp: 90 tablet, Rfl: 1   Cholecalciferol (VITAMIN D3) 50 MCG (2000 UT) capsule, Take 1 capsule (2,000 Units total) by mouth daily., Disp: 12 capsule, Rfl: 1   hydrocortisone  1 % ointment, Apply 1 Application topically 2 (two)  times daily., Disp: 30 g, Rfl: 0   JANUVIA  100 MG tablet, Take 1 tablet (100 mg total) by mouth daily., Disp: 90 tablet, Rfl: 1   mirtazapine  (REMERON ) 15 MG tablet, TAKE 1 TABLET BY MOUTH EVERYDAY AT BEDTIME, Disp: 90 tablet, Rfl: 1   omeprazole  (PRILOSEC) 20 MG capsule, Take 1 capsule (20 mg total) by mouth daily., Disp: 90 capsule, Rfl: 1   sertraline  (ZOLOFT ) 50 MG tablet, Take 1 tablet (50 mg total) by mouth daily., Disp: 90 tablet, Rfl: 1   Simethicone  180 MG CAPS, Take 1 capsule (180 mg total) by mouth 3 (three) times daily., Disp: 270 capsule, Rfl: 1   vitamin B-12 (CYANOCOBALAMIN ) 100 MCG tablet, Take 100 mcg by mouth daily., Disp: , Rfl:    losartan  (COZAAR ) 50 MG tablet, Take 1 tablet (50 mg total) by mouth daily., Disp: 90 tablet, Rfl: 1  No Known Allergies  I personally reviewed active problem list, medication list, allergies, family history with the patient/caregiver today.   ROS  Ten systems reviewed and is negative except as mentioned in HPI    Objective Physical Exam Constitutional: Patient appears well-developed and well-nourished.  No distress.  HEENT: head atraumatic, normocephalic, pupils equal and reactive to light, neck supple, goiter Cardiovascular: Normal rate, regular rhythm and normal heart sounds.  No murmur heard. No BLE edema. Pulmonary/Chest: Effort normal and breath sounds normal. No respiratory distress. Abdominal: Soft.  There is no tenderness. Muscular skeletal: mild effusion, pain with rom, crepitus with extension of right knee  Psychiatric: Patient has a normal mood and affect. behavior is normal. Judgment and thought content normal.    Vitals:   06/22/23 1540  BP: 118/62  Pulse: 87  Resp: 18  Temp: 98.1 F (36.7 C)  SpO2: 96%  Weight: 148 lb 9.6 oz (67.4 kg)  Height: 5\' 3"  (1.6 m)    Body mass index is 26.32 kg/m.  Recent Results (from the past 2160 hours)  COMPLETE METABOLIC PANEL WITH GFR     Status: Abnormal   Collection Time:  05/12/23  3:33 PM  Result Value Ref Range   Glucose, Bld 107 (H) 65 - 99 mg/dL    Comment: .            Fasting reference interval . For someone without known diabetes, a glucose value between 100 and 125 mg/dL is consistent with prediabetes and should be confirmed with a follow-up test. .    BUN 17 7 - 25 mg/dL   Creat 3.08 6.57 - 8.46 mg/dL   eGFR 79 > OR = 60 NG/EXB/2.84X3   BUN/Creatinine Ratio SEE NOTE: 6 - 22 (calc)    Comment:    Not Reported: BUN and Creatinine are within    reference range. .    Sodium 141 135 - 146 mmol/L   Potassium 4.3 3.5 - 5.3 mmol/L   Chloride 103 98 - 110 mmol/L   CO2 32 20 - 32 mmol/L   Calcium  10.0 8.6 -  10.4 mg/dL   Total Protein 7.4 6.1 - 8.1 g/dL   Albumin 4.5 3.6 - 5.1 g/dL   Globulin 2.9 1.9 - 3.7 g/dL (calc)   AG Ratio 1.6 1.0 - 2.5 (calc)   Total Bilirubin 0.4 0.2 - 1.2 mg/dL   Alkaline phosphatase (APISO) 99 37 - 153 U/L   AST 14 10 - 35 U/L   ALT 14 6 - 29 U/L  Lipid panel     Status: None   Collection Time: 05/12/23  3:33 PM  Result Value Ref Range   Cholesterol 149 <200 mg/dL   HDL 62 > OR = 50 mg/dL   Triglycerides 454 <098 mg/dL   LDL Cholesterol (Calc) 65 mg/dL (calc)    Comment: Reference range: <100 . Desirable range <100 mg/dL for primary prevention;   <70 mg/dL for patients with CHD or diabetic patients  with > or = 2 CHD risk factors. [name] LDL-C is now calculated using the Martin-Hopkins  calculation, which is a validated novel method providing  better accuracy than the Friedewald equation in the  estimation of LDL-C.  Melinda Sprawls et al. Erroll Heard. 1191;478(29): 2061-2068  (http://education.[facility].com/faq/FAQ164)    Total CHOL/HDL Ratio 2.4 <5.0 (calc)   Non-HDL Cholesterol (Calc) 87 <562 mg/dL (calc)    Comment: For patients with diabetes plus 1 major ASCVD risk  factor, treating to a non-HDL-C goal of <100 mg/dL  (LDL-C of <13 mg/dL) is considered a therapeutic  option.   CBC with Differential/Platelet      Status: None   Collection Time: 05/12/23  3:33 PM  Result Value Ref Range   WBC 8.4 3.8 - 10.8 Thousand/uL   RBC 4.27 3.80 - 5.10 Million/uL   Hemoglobin 12.2 11.7 - 15.5 g/dL   HCT 08.6 57.8 - 46.9 %   MCV 89.0 80.0 - 100.0 fL   MCH 28.6 27.0 - 33.0 pg   MCHC 32.1 32.0 - 36.0 g/dL    Comment: For adults, a slight decrease in the calculated MCHC value (in the range of 30 to 32 g/dL) is most likely not clinically significant; however, it should be interpreted with caution in correlation with other red cell parameters and the patient's clinical condition.    RDW 12.7 11.0 - 15.0 %   Platelets 239 140 - 400 Thousand/uL   MPV 11.9 7.5 - 12.5 fL   Neutro Abs 3,998 1,500 - 7,800 cells/uL   Absolute Lymphocytes 3,620 850 - 3,900 cells/uL   Absolute Monocytes 445 200 - 950 cells/uL   Eosinophils Absolute 294 15 - 500 cells/uL   Basophils Absolute 42 0 - 200 cells/uL   Neutrophils Relative % 47.6 %   Total Lymphocyte 43.1 %   Monocytes Relative 5.3 %   Eosinophils Relative 3.5 %   Basophils Relative 0.5 %  VITAMIN D  25 Hydroxy (Vit-D Deficiency, Fractures)     Status: Abnormal   Collection Time: 05/12/23  3:33 PM  Result Value Ref Range   Vit D, 25-Hydroxy 12 (L) 30 - 100 ng/mL    Comment: Vitamin D  Status         25-OH Vitamin D : . Deficiency:                    <20 ng/mL Insufficiency:             20 - 29 ng/mL Optimal:                 > or = 30 ng/mL . For 25-OH  Vitamin D  testing on patients on  D2-supplementation and patients for whom quantitation  of D2 and D3 fractions is required, the [facility]ure[facility](TM) 25-OH VIT D, (D2,D3), LC/MS/MS is recommended: order  code 40981 (patients >16yrs). . See Note 1 . Note 1 . For additional information, please refer to  http://education.[facility].com/faq/FAQ199  (This link is being provided for informational/ educational purposes only.)   Parathyroid  hormone, intact (no Ca)     Status: Abnormal   Collection Time: 05/12/23   3:33 PM  Result Value Ref Range   PTH 84 (H) 16 - 77 pg/mL    Comment: . Interpretive Guide    Intact PTH           Calcium  ------------------    ----------           ------- Normal Parathyroid     Normal               Normal Hypoparathyroidism    Low or Low Normal    Low Hyperparathyroidism    Primary            Normal or High       High    Secondary          High                 Normal or Low    Tertiary           High                 High Non-Parathyroid     Hypercalcemia      Low or Low Normal    High .   TSH     Status: None   Collection Time: 05/12/23  3:33 PM  Result Value Ref Range   TSH 0.88 0.40 - 4.50 mIU/L  B12 and Folate Panel     Status: None   Collection Time: 05/12/23  3:33 PM  Result Value Ref Range   Vitamin B-12 611 200 - 1,100 pg/mL   Folate 22.9 ng/mL    Comment:                            Reference Range                            Low:           <3.4                            Borderline:    3.4-5.4                            Normal:        >5.4 .   POCT Urinalysis Dipstick     Status: None   Collection Time: 06/22/23  4:04 PM  Result Value Ref Range   Color, UA 7.1    Clarity, UA     Glucose, UA     Bilirubin, UA     Ketones, UA     Spec Grav, UA     Blood, UA     pH, UA     Protein, UA     Urobilinogen, UA     Nitrite, UA     Leukocytes, UA     Appearance     Odor    POCT HgB  A1C     Status: Abnormal   Collection Time: 06/22/23  4:06 PM  Result Value Ref Range   Hemoglobin A1C 7.1 (A) 4.0 - 5.6 %   HbA1c POC (<> result, manual entry)     HbA1c, POC (prediabetic range)     HbA1c, POC (controlled diabetic range)      Diabetic Foot Exam:     PHQ2/9:    06/22/2023    4:09 PM 02/17/2023    3:06 PM 08/18/2022    1:52 PM 03/28/2022    8:03 AM 03/04/2022   12:49 PM  Depression screen PHQ 2/9  Decreased Interest 0 1 0 0 0  Down, Depressed, Hopeless 2 1 0 0 0  PHQ - 2 Score 2 2 0 0 0  Altered sleeping 0 2 2 0 0  Tired, decreased energy  3 3 2  0 0  Change in appetite 3 1 0 0 0  Feeling bad or failure about yourself  0 0 0 0 0  Trouble concentrating 0 1 1 0 0  Moving slowly or fidgety/restless 0 0 0 0 0  Suicidal thoughts 0 0 0 0 0  PHQ-9 Score 8 9 5  0 0  Difficult doing work/chores  Very difficult Not difficult at all Not difficult at all Not difficult at all    phq 9 is positive  Fall Risk:    06/22/2023    3:41 PM 02/17/2023    3:06 PM 08/18/2022    1:51 PM 03/28/2022    8:03 AM 03/04/2022   12:49 PM  Fall Risk   Falls in the past year? 0 0 0 0 0  Number falls in past yr: 0   0 0  Injury with Fall? 0   0 0  Risk for fall due to :  No Fall Risks No Fall Risks No Fall Risks No Fall Risks  Follow up Falls evaluation completed Falls prevention discussed Falls prevention discussed Falls prevention discussed;Education provided;Falls evaluation completed Falls prevention discussed;Education provided;Falls evaluation completed     Assessment & Plan Type 2 diabetes mellitus with hyperglycemia A1c increased to 7.1%, indicating suboptimal control. Dietary habits contribute to hyperglycemia. - Check A1c level. - Advise dietary modifications: reduce sweet beverages, switch to whole grain bread, increase protein intake. - Continue Januvia  100 mg daily. - Schedule eye exam and inform provider of diabetes status.  Primary osteoarthritis of right knee Significant pain and swelling affecting quality of life. Discussed potential benefits of knee replacement surgery. - Consider referral to orthopedic surgeon for evaluation of knee replacement. - Continue Tylenol and Voltaren gel for pain management.  Dysphagia Possible esophageal narrowing. Discussed need for gastroenterologist evaluation and possible esophageal dilation. - Refer to gastroenterologist for evaluation and possible esophageal dilation. - Initiate omeprazole  for reflux management.  Major depressive disorder, recurrent, mild Escitalopram  causing nausea. PHQ-9  indicates mild depression. Discussed switching to Zoloft . - Discontinue escitalopram  due to nausea. - Initiate Zoloft  50 mg daily. - Continue mirtazapine  for sleep.  Hyperparathyroidism Previously elevated parathyroid  hormone levels. No current follow-up with endocrinologist. - Recommend follow-up with endocrinologist for hyperparathyroidism management.  Dyslipidemia Well-controlled with atorvastatin . - Continue atorvastatin  as prescribed.  Atherosclerosis of aorta Managed with atorvastatin  and losartan . Blood pressure well-controlled. - Continue atorvastatin  and losartan  as prescribed. - Monitor blood pressure at home and report if consistently below 120 mmHg.

## 2023-06-22 NOTE — Telephone Encounter (Signed)
 Please sch awv

## 2023-06-24 ENCOUNTER — Ambulatory Visit
Admission: RE | Admit: 2023-06-24 | Discharge: 2023-06-24 | Disposition: A | Discharge status: Home / Self Care | Source: Ambulatory Visit | Attending: Family Medicine | Admitting: Family Medicine

## 2023-06-24 DIAGNOSIS — Z1231 Encounter for screening mammogram for malignant neoplasm of breast: Secondary | ICD-10-CM | POA: Insufficient documentation

## 2023-06-29 ENCOUNTER — Other Ambulatory Visit: Payer: Self-pay | Admitting: Family Medicine

## 2023-06-29 DIAGNOSIS — R928 Other abnormal and inconclusive findings on diagnostic imaging of breast: Secondary | ICD-10-CM

## 2023-07-01 ENCOUNTER — Ambulatory Visit
Admission: RE | Admit: 2023-07-01 | Discharge: 2023-07-01 | Disposition: A | Discharge status: Home / Self Care | Source: Ambulatory Visit | Attending: Family Medicine | Admitting: Family Medicine

## 2023-07-01 DIAGNOSIS — R928 Other abnormal and inconclusive findings on diagnostic imaging of breast: Secondary | ICD-10-CM | POA: Diagnosis present

## 2023-07-02 ENCOUNTER — Other Ambulatory Visit: Payer: Self-pay | Admitting: Family Medicine

## 2023-07-02 DIAGNOSIS — D0512 Intraductal carcinoma in situ of left breast: Secondary | ICD-10-CM

## 2023-07-02 DIAGNOSIS — R928 Other abnormal and inconclusive findings on diagnostic imaging of breast: Secondary | ICD-10-CM

## 2023-07-02 HISTORY — DX: Intraductal carcinoma in situ of left breast: D05.12

## 2023-07-03 ENCOUNTER — Ambulatory Visit
Admission: RE | Admit: 2023-07-03 | Discharge: 2023-07-03 | Disposition: A | Discharge status: Home / Self Care | Source: Ambulatory Visit | Attending: Family Medicine | Admitting: Family Medicine

## 2023-07-03 DIAGNOSIS — R928 Other abnormal and inconclusive findings on diagnostic imaging of breast: Secondary | ICD-10-CM | POA: Insufficient documentation

## 2023-07-03 DIAGNOSIS — D0512 Intraductal carcinoma in situ of left breast: Secondary | ICD-10-CM | POA: Insufficient documentation

## 2023-07-03 HISTORY — PX: BREAST BIOPSY: SHX20

## 2023-07-03 MED ORDER — LIDOCAINE 1 % OPTIME INJ - NO CHARGE
5.0000 mL | Freq: Once | INTRAMUSCULAR | Status: AC
  Administered 2023-07-03: 5 mL
  Filled 2023-07-03: qty 6

## 2023-07-03 MED ORDER — LIDOCAINE-EPINEPHRINE 1 %-1:100000 IJ SOLN
20.0000 mL | Freq: Once | INTRAMUSCULAR | Status: AC
  Administered 2023-07-03: 20 mL
  Filled 2023-07-03: qty 20

## 2023-07-06 ENCOUNTER — Encounter: Payer: Self-pay | Admitting: *Deleted

## 2023-07-06 LAB — SURGICAL PATHOLOGY

## 2023-07-06 NOTE — Progress Notes (Signed)
 Received referral for newly diagnosed breast cancer from [facility].  Navigation initiated.  She is going to look up the providers and I will call her back tomorrow to see who she would like to meet with.

## 2023-07-07 ENCOUNTER — Encounter: Payer: Self-pay | Admitting: *Deleted

## 2023-07-07 DIAGNOSIS — D0512 Intraductal carcinoma in situ of left breast: Secondary | ICD-10-CM

## 2023-07-07 NOTE — Progress Notes (Signed)
 Laurie Daniels would like to see Dr. Wilhelmenia Harada and Dr. Charmel Cooter.   She will see Dr. Charmel Cooter on Thursday May 8 at 8:00 and Dr. Wilhelmenia Harada at 11:15 the same day.

## 2023-07-09 ENCOUNTER — Inpatient Hospital Stay

## 2023-07-09 ENCOUNTER — Other Ambulatory Visit: Payer: Self-pay | Admitting: General Surgery

## 2023-07-09 ENCOUNTER — Inpatient Hospital Stay: Attending: Oncology | Admitting: Oncology

## 2023-07-09 ENCOUNTER — Encounter: Payer: Self-pay | Admitting: *Deleted

## 2023-07-09 ENCOUNTER — Encounter: Payer: Self-pay | Admitting: Oncology

## 2023-07-09 ENCOUNTER — Ambulatory Visit: Payer: Self-pay | Admitting: General Surgery

## 2023-07-09 VITALS — BP 154/61 | HR 70 | Temp 97.6°F | Resp 18 | Wt 146.5 lb

## 2023-07-09 DIAGNOSIS — Z79899 Other long term (current) drug therapy: Secondary | ICD-10-CM | POA: Insufficient documentation

## 2023-07-09 DIAGNOSIS — Z9189 Other specified personal risk factors, not elsewhere classified: Secondary | ICD-10-CM

## 2023-07-09 DIAGNOSIS — D0512 Intraductal carcinoma in situ of left breast: Secondary | ICD-10-CM | POA: Diagnosis present

## 2023-07-09 DIAGNOSIS — Z7982 Long term (current) use of aspirin: Secondary | ICD-10-CM | POA: Diagnosis not present

## 2023-07-09 DIAGNOSIS — Z7984 Long term (current) use of oral hypoglycemic drugs: Secondary | ICD-10-CM | POA: Diagnosis not present

## 2023-07-09 DIAGNOSIS — Z801 Family history of malignant neoplasm of trachea, bronchus and lung: Secondary | ICD-10-CM | POA: Diagnosis not present

## 2023-07-09 DIAGNOSIS — Z170 Estrogen receptor positive status [ER+]: Secondary | ICD-10-CM | POA: Insufficient documentation

## 2023-07-09 DIAGNOSIS — E1140 Type 2 diabetes mellitus with diabetic neuropathy, unspecified: Secondary | ICD-10-CM | POA: Insufficient documentation

## 2023-07-09 DIAGNOSIS — I10 Essential (primary) hypertension: Secondary | ICD-10-CM | POA: Diagnosis not present

## 2023-07-09 DIAGNOSIS — I700 Atherosclerosis of aorta: Secondary | ICD-10-CM | POA: Diagnosis not present

## 2023-07-09 DIAGNOSIS — G4700 Insomnia, unspecified: Secondary | ICD-10-CM | POA: Insufficient documentation

## 2023-07-09 NOTE — H&P (View-Only) (Signed)
 History of Present Illness Laurie Daniels is a 68 year old female who presents for evaluation and surgical consultation for left breast cancer.  She underwent a screening mammogram on June 24, 2023, which revealed possible calcifications in the left breast. A diagnostic mammogram and breast ultrasound confirmed a 5 mm group of amorphous calcifications in the upper outer quadrant of the left breast. A biopsy of these calcifications was performed, which showed ductal carcinoma in situ, high grade. The report noted that focal invasion could not be entirely ruled out.  She undergoes regular annual mammograms and was called back for additional imaging after the initial screening. She describes the biopsy experience as 'scary' and 'hurting' when the needle was inserted, but she managed through it.  She has no family history of breast cancer, although her father had lung cancer. She had her first menstrual cycle at around [age] years old and has had two pregnancies, resulting in two sons. She denies any use of hormone therapies post-menopause.  She lives by herself, with her 71 year old granddaughter residing with her. She has seven sisters and describes them as being close, providing a support network.      PAST MEDICAL HISTORY:  Past Medical History:  Diagnosis Date   Atherosclerosis of aorta ()    Breast cancer (CMS/HHS-HCC) 07/2023   Lt -- DCIS   Depressed    GAD (generalized anxiety disorder)    Insomnia    Irritable bowel syndrome with constipation    Migraine    Thyroid  nodules    Type 2 diabetes mellitus (CMS/HHS-HCC)         PAST SURGICAL HISTORY:   History reviewed. No pertinent surgical history.       MEDICATIONS:  Outpatient Encounter Medications as of 07/09/2023  Medication Sig Dispense Refill   aspirin 81 MG EC tablet Take 81 mg by mouth once daily.     atorvastatin  (LIPITOR) 40 MG tablet Take 40 mg by mouth once daily     cholecalciferol (VITAMIN D3) 2,000 unit capsule  Take 2,000 Units by mouth once daily     escitalopram  oxalate (LEXAPRO ) 10 MG tablet Take 10 mg by mouth once daily     JANUVIA  100 mg tablet Take 100 mg by mouth once daily     linaCLOtide  (LINZESS ) 72 mcg capsule Take 72 mcg by mouth once daily     losartan  (COZAAR ) 50 MG tablet Take 50 mg by mouth once daily     mirtazapine  (REMERON ) 15 MG tablet Take 1 tablet by mouth at bedtime     multivitamin tablet Take 1 tablet by mouth once daily.     omeprazole  (PRILOSEC) 20 MG DR capsule Take 1 capsule by mouth once daily     sertraline  (ZOLOFT ) 50 MG tablet Take 50 mg by mouth once daily     losartan  (COZAAR ) 25 MG tablet Take 25 mg by mouth once daily (Patient not taking: Reported on 07/09/2023)     No facility-administered encounter medications on file as of 07/09/2023.     ALLERGIES:   Patient has no known allergies.   SOCIAL HISTORY:  Social History   Socioeconomic History   Marital status: Single  Tobacco Use   Smoking status: Never   Smokeless tobacco: Never  Vaping Use   Vaping status: Never Used  Substance and Sexual Activity   Alcohol use: No   Social Drivers of Corporate investment banker Strain: High Risk (07/09/2023)   Overall Financial Resource Strain (CARDIA)  Difficulty of Paying Living Expenses: Hard  Food Insecurity: Food Insecurity Present (07/09/2023)   Hunger Vital Sign    Worried About Running Out of Food in the Last Year: Sometimes true    Ran Out of Food in the Last Year: Never true  Transportation Needs: No Transportation Needs (07/09/2023)   PRAPARE - Administrator, Civil Service (Medical): No    Lack of Transportation (Non-Medical): No    FAMILY HISTORY:  Family History  Problem Relation Name Age of Onset   Diabetes Mother         amputations r/t DM   Bone cancer Mother     Lung cancer Father     Diabetes Sister     No Known Problems Brother     No Known Problems Maternal Grandmother     No Known Problems Maternal Grandfather     No  Known Problems Paternal Grandmother     No Known Problems Paternal Grandfather     Thyroid  disease Sister     Myocardial Infarction (Heart attack) Maternal Aunt     Myocardial Infarction (Heart attack) Maternal Aunt     Myocardial Infarction (Heart attack) Maternal Aunt     Myocardial Infarction (Heart attack) Maternal Aunt     Myocardial Infarction (Heart attack) Maternal Aunt       GENERAL REVIEW OF SYSTEMS:   General ROS: negative for - chills, fatigue, fever, weight gain or weight loss Allergy and Immunology ROS: negative for - hives  Hematological and Lymphatic ROS: negative for - bleeding problems or bruising, negative for palpable nodes Endocrine ROS: negative for - heat or cold intolerance, hair changes Respiratory ROS: negative for - cough, shortness of breath or wheezing Cardiovascular ROS: no chest pain or palpitations GI ROS: negative for nausea, vomiting, abdominal pain, diarrhea, constipation Musculoskeletal ROS: negative for - joint swelling or muscle pain Neurological ROS: negative for - confusion, syncope Dermatological ROS: negative for pruritus and rash  PHYSICAL EXAM:  Vitals:   07/09/23 0803  BP: 138/76  Pulse: 74  .  Ht:160 cm (5\' 3" ) Wt:66.7 kg (147 lb) ZOX:WRUE surface area is 1.72 meters squared. Body mass index is 26.04 kg/m.[name]   GENERAL: Alert, active, oriented x3  HEENT: Pupils equal reactive to light. Extraocular movements are intact. Sclera clear. Palpebral conjunctiva normal red color.Pharynx clear.  NECK: Supple with no palpable mass and no adenopathy.  LUNGS: Sound clear with no rales rhonchi or wheezes.  HEART: Regular rhythm S1 and S2 without murmur.  BREAST: Both breast examined in the sitting and supine position.  There is no palpable masses, skin changes, nipple retraction or nipple discharge.  EXTREMITIES: Well-developed well-nourished symmetrical with no dependent edema.  NEUROLOGICAL: Awake alert oriented, facial expression  symmetrical, moving all extremities.   Results RADIOLOGY (I personally evaluated this images) Screening mammogram: Possible calcification on the left breast (06/24/2023) Diagnostic mammogram: 5 mm group of amorphous calcification in the upper outer quadrant of the left breast Breast ultrasound: 5 mm group of amorphous calcification in the upper outer quadrant of the left breast  PATHOLOGY Breast biopsy: Ductal carcinoma in situ, high grade. 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 It is high grade with potential microinvasion, suggesting possible stage I breast cancer. Surgical excision is planned to ensure clear margins and  address progression risk. DCIS is stage 0 breast cancer, typically non-metastatic, but microinvasion requires further intervention. A lumpectomy is preferred over mastectomy due to the lesion's small size and DCIS nature. The surgical procedure, risks, and benefits, including potential re-excision if margins are positive, were discussed. The rationale for lymph node evaluation during surgery to address possible invasion was explained. Schedule lumpectomy with sentinel lymph node biopsy. Educated on postoperative care: ice packs, sports bra, swelling, and pain management. Advise on recovery: 1-2 weeks, with potential 2 weeks off work due to physical job demands. Coordinate with oncology for potential adjuvant radiation and hormonal therapy post-surgery.   Ductal carcinoma in situ (DCIS) of left breast [D05.12]          Patient verbalized understanding, all questions were answered, and were agreeable with the plan outlined above.   Eldred Grego, MD  Electronically signed by Eldred Grego, MD

## 2023-07-09 NOTE — Telephone Encounter (Signed)
 Called to schedule AVS no answer left vm

## 2023-07-09 NOTE — Addendum Note (Signed)
 Addended by: Waverly Hageman on: 07/09/2023 01:22 PM   Modules accepted: Orders

## 2023-07-09 NOTE — Progress Notes (Signed)
 Accompanied patient and family to initial medical oncology appointment.   Reviewed Breast Cancer treatment handbook.   Care plan summary given to patient.   Reviewed outreach programs and cancer center services.

## 2023-07-09 NOTE — Progress Notes (Signed)
 Hematology/Oncology Consult Note [phone] [phone]     REFERRING PROVIDER: [name], [name], MD    CHIEF COMPLAINTS/PURPOSE OF CONSULTATION:  Left DCIS  ASSESSMENT & PLAN:   Ductal carcinoma in situ (DCIS) of left breast Imaging and biopsy results were discussed with patient.  Left breast high grade DCIS. The DCIS diagnosis and care plan were discussed with patient in detail.  We discussed that DCIS is a non invasive breast cancer,  cancer is only in the duct and has not spread into the tissues around it. 10% high grade DCIS may have invasive component on final surgical resection specimen.  Recommend lumpectomy.  She has discussed with surgery.  ER status will be added to final pathology.  She will need adjuvant radiation and if ER+, will need adjuvant endocrine therapy.     No orders of the defined types were placed in this encounter.  Follow up a few weeks after surgery.  All questions were answered. The patient knows to call the clinic with any problems, questions or concerns.  Timmy Forbes, MD, PhD [facility] Hematology Oncology 07/09/2023    HISTORY OF PRESENTING ILLNESS:  Laurie Daniels 68 y.o. female presents to establish care for left breast DCIS  I have reviewed her chart and materials related to her cancer extensively and collaborated history with the patient. Summary of oncologic history is as follows: Oncology History  Ductal carcinoma in situ (DCIS) of left breast  06/29/2023 Mammogram   Screening mammogram  In the left breast, calcifications warrant further evaluation. In the right breast, no findings suspicious for malignancy.   07/01/2023 Mammogram   Bilateral diagnostic mammogram  LEFT breast 5 mm group of amorphous calcification in the upper-outer quadrant is low suspicion for malignancy. Recommend further assessment with stereotactic guided biopsy.   07/09/2023 Initial Diagnosis   Ductal carcinoma in situ (DCIS) of left  breast  Patient underwent left breast biopsy.  Pathology showed 1. Breast, left, needle core biopsy, upper outer quadrant (ribbon clip) :   - DUCTAL CARCINOMA IN SITU, HIGH GRADE   - FOCAL MICROINVASION CANNOT BE ENTIRELY RULED OUT   - NECROSIS: PRESENT   - CALCIFICATIONS: PRESENT   - DCIS LENGTH: 0.2 CM     07/09/2023 Cancer Staging   Staging form: Breast, AJCC 8th Edition - Clinical stage from 07/09/2023: Stage 0 (cTis (DCIS), cN0, cM0, G3) - Signed by Timmy Forbes, MD on 07/09/2023 Stage prefix: Initial diagnosis Histologic grading system: 3 grade system    She was accompanied by sister to clinic to discuss management.    MEDICAL HISTORY:  Past Medical History:  Diagnosis Date   Anxiety and depression    Atherosclerosis of aorta (HCC)    CTS (carpal tunnel syndrome)    Depressive disorder    Diabetes mellitus without complication (HCC)    Diabetic neuropathy (HCC)    Hyperactivity of bladder    Hypertension    Insomnia    Lumbar sprain    Ovarian failure    Rhinitis    Vaginitis and vulvovaginitis     SURGICAL HISTORY: Past Surgical History:  Procedure Laterality Date   BREAST BIOPSY Left 07/03/2023   Stereo bx, ribbon clip, path pending   BREAST BIOPSY Left 07/03/2023   MM LT BREAST BX W LOC DEV 1ST LESION IMAGE BX SPEC STEREO GUIDE 07/03/2023 [facility]   CATARACT EXTRACTION Right 08/31/2017   CESAREAN SECTION     COLONOSCOPY  06/17/2007   Dr Ole Berkeley   COLONOSCOPY WITH PROPOFOL  N/A  07/22/2017   Procedure: COLONOSCOPY WITH PROPOFOL ;  Surgeon: Marshall Skeeter, MD;  Location: [facility] ENDOSCOPY;  Service: Endoscopy;  Laterality: N/A;   TUBAL LIGATION     vitrectomy Right 07/10/2016   done at [facility]    SOCIAL HISTORY: Social History   Socioeconomic History   Marital status: Divorced    Spouse name: Not on file   Number of children: 2   Years of education: Not on file   Highest education level: 12th grade  Occupational History   Occupation: housekeeping   Tobacco  Use   Smoking status: Never   Smokeless tobacco: Never  Vaping Use   Vaping status: Never Used  Substance and Sexual Activity   Alcohol use: No    Alcohol/week: 0.0 standard drinks of alcohol   Drug use: No   Sexual activity: Yes    Partners: Male  Other Topics Concern   Not on file  Social History Narrative   Divorced since 2005, she has a boyfriend but they don't live together      Pt's son lives with her   Social Drivers of Corporate investment banker Strain: High Risk (07/09/2023)   Received from [facility]   Overall Financial Resource Strain (CARDIA)    Difficulty of Paying Living Expenses: Hard  Food Insecurity: Food Insecurity Present (07/09/2023)   Received from [facility]   Hunger Vital Sign    Worried About Running Out of Food in the Last Year: Sometimes true    Ran Out of Food in the Last Year: Never true  Transportation Needs: No Transportation Needs (07/09/2023)   Received from [facility] - [facility]    In the past 12 months, has lack of transportation kept you from medical appointments or from getting medications?: No    Lack of Transportation (Non-Medical): No  Physical Activity: Inactive (02/26/2021)   Exercise Vital Sign    Days of Exercise per Week: 0 days    Minutes of Exercise per Session: 0 min  Stress: No Stress Concern Present (02/26/2021)   [employer] of Occupational Health - Occupational Stress Questionnaire    Feeling of Stress : Not at all  Social Connections: Moderately Integrated (02/26/2021)   Social Connection and Isolation Panel [NHANES]    Frequency of Communication with Friends and Family: More than three times a week    Frequency of Social Gatherings with Friends and Family: More than three times a week    Attends Religious Services: More than 4 times per year    Active Member of [employer] or Organizations: Yes    Attends Engineer, structural: More than 4  times per year    Marital Status: Divorced  Intimate Partner Violence: Not At Risk (02/26/2021)   Humiliation, Afraid, Rape, and Kick questionnaire    Fear of Current or Ex-Partner: No    Emotionally Abused: No    Physically Abused: No    Sexually Abused: No    FAMILY HISTORY: Family History  Problem Relation Age of Onset   Hypertension Mother    Diabetes Mother    Lung cancer Father    Colon cancer Neg Hx     ALLERGIES:  has no known allergies.  MEDICATIONS:  Current Outpatient Medications  Medication Sig Dispense Refill   aspirin 81 MG tablet Take 81 mg by mouth daily.     atorvastatin  (LIPITOR) 40 MG tablet Take 1 tablet (40 mg total) by mouth daily. 90  tablet 1   Cholecalciferol (VITAMIN D3) 50 MCG (2000 UT) capsule Take 1 capsule (2,000 Units total) by mouth daily. 12 capsule 1   hydrocortisone  1 % ointment Apply 1 Application topically 2 (two) times daily. 30 g 0   JANUVIA  100 MG tablet Take 1 tablet (100 mg total) by mouth daily. 90 tablet 1   losartan  (COZAAR ) 50 MG tablet Take 1 tablet (50 mg total) by mouth daily. 90 tablet 1   mirtazapine  (REMERON ) 15 MG tablet TAKE 1 TABLET BY MOUTH EVERYDAY AT BEDTIME 90 tablet 1   omeprazole  (PRILOSEC) 20 MG capsule Take 1 capsule (20 mg total) by mouth daily. 90 capsule 1   sertraline  (ZOLOFT ) 50 MG tablet Take 1 tablet (50 mg total) by mouth daily. 90 tablet 1   Simethicone  180 MG CAPS Take 1 capsule (180 mg total) by mouth 3 (three) times daily. 270 capsule 1   vitamin B-12 (CYANOCOBALAMIN ) 100 MCG tablet Take 100 mcg by mouth daily.     No current facility-administered medications for this visit.    Review of Systems  Constitutional:  Negative for appetite change, chills, fatigue and fever.  HENT:   Negative for hearing loss and voice change.   Eyes:  Negative for eye problems.  Respiratory:  Negative for chest tightness and cough.   Cardiovascular:  Negative for chest pain.  Gastrointestinal:  Negative for abdominal  distention, abdominal pain and blood in stool.  Endocrine: Negative for hot flashes.  Genitourinary:  Negative for difficulty urinating and frequency.   Musculoskeletal:  Negative for arthralgias.  Skin:  Negative for itching and rash.  Neurological:  Negative for extremity weakness.  Hematological:  Negative for adenopathy.  Psychiatric/Behavioral:  Negative for confusion.      PHYSICAL EXAMINATION: ECOG PERFORMANCE STATUS: 0 - Asymptomatic  Vitals:   07/09/23 1105  BP: (!) 154/61  Pulse: 70  Resp: 18  Temp: 97.6 F (36.4 C)  SpO2: 100%   Filed Weights   07/09/23 1105  Weight: 146 lb 8 oz (66.5 kg)    Physical Exam Constitutional:      General: She is not in acute distress.    Appearance: She is not diaphoretic.  HENT:     Head: Normocephalic and atraumatic.     Nose: Nose normal.     Mouth/Throat:     Pharynx: No oropharyngeal exudate.  Eyes:     General: No scleral icterus.    Pupils: Pupils are equal, round, and reactive to light.  Cardiovascular:     Rate and Rhythm: Normal rate and regular rhythm.     Heart sounds: No murmur heard. Pulmonary:     Effort: Pulmonary effort is normal. No respiratory distress.     Breath sounds: No rales.  Chest:     Chest wall: No tenderness.  Abdominal:     General: There is no distension.     Palpations: Abdomen is soft.     Tenderness: There is no abdominal tenderness.  Musculoskeletal:        General: Normal range of motion.     Cervical back: Normal range of motion and neck supple.  Skin:    General: Skin is warm and dry.     Findings: No erythema.  Neurological:     Mental Status: She is alert and oriented to person, place, and time.     Cranial Nerves: No cranial nerve deficit.     Motor: No abnormal muscle tone.     Coordination: Coordination normal.  Psychiatric:        Mood and Affect: Affect normal.    Breast exam was performed in seated and lying down position. Patient is status post left breast biopsy  with focal bruising and swelling.   No palpable breast masses bilaterally.  No palpable axillary adenopathy bilaterally.   LABORATORY DATA:  I have reviewed the data as listed    Latest Ref Rng & Units 05/12/2023    3:33 PM 02/11/2022    3:59 PM 01/17/2020   12:02 PM  CBC  WBC 3.8 - 10.8 Thousand/uL 8.4  10.0  8.8   Hemoglobin 11.7 - 15.5 g/dL 38.7  56.4  33.2   Hematocrit 35.0 - 45.0 % 38.0  41.0  40.7   Platelets 140 - 400 Thousand/uL 239  211  255       Latest Ref Rng & Units 05/12/2023    3:33 PM 02/11/2022    3:59 PM 05/10/2021    4:25 PM  CMP  Glucose 65 - 99 mg/dL 951  884    BUN 7 - 25 mg/dL 17  19    Creatinine 1.66 - 1.05 mg/dL 0.63  0.16    Sodium 010 - 146 mmol/L 141  142    Potassium 3.5 - 5.3 mmol/L 4.3  3.9    Chloride 98 - 110 mmol/L 103  105    CO2 20 - 32 mmol/L 32  27    Calcium  8.6 - 10.4 mg/dL 93.2  35.5    73.2  9.9   Total Protein 6.1 - 8.1 g/dL 7.4  7.4    Total Bilirubin 0.2 - 1.2 mg/dL 0.4  0.3    AST 10 - 35 U/L 14  13    ALT 6 - 29 U/L 14  14       RADIOGRAPHIC STUDIES: I have personally reviewed the radiological images as listed and agreed with the findings in the report. MM LT BREAST BX W LOC DEV 1ST LESION IMAGE BX SPEC STEREO GUIDE Result Date: 07/03/2023 CLINICAL DATA:  68 year old female presenting for stereotactic guided biopsy of indeterminate calcifications in the LEFT breast. EXAM: LEFT BREAST STEREOTACTIC CORE NEEDLE BIOPSY COMPARISON:  Previous exam(s). FINDINGS: The patient and I discussed the procedure of stereotactic-guided biopsy including benefits and alternatives. We discussed the high likelihood of a successful procedure. We discussed the risks of the procedure including infection, bleeding, tissue injury, clip migration, and inadequate sampling. Informed written consent was given. The usual time out protocol was performed immediately prior to the procedure. Using sterile technique and 1% Lidocaine  as local anesthetic, under  stereotactic guidance, a 9 gauge vacuum assisted device was used to perform core needle biopsy of calcifications in the upper outer quadrant of the left breast using a superior approach. Specimen radiograph was performed showing representative calcifications. Specimens with calcifications are identified for pathology. Lesion quadrant: Upper outer quadrant At the conclusion of the procedure, ribbon shaped tissue marker clip was deployed into the biopsy cavity. Follow-up 2-view mammogram was performed and dictated separately. IMPRESSION: Stereotactic-guided biopsy of calcifications in the upper-outer LEFT breast. No apparent complications. Electronically Signed   By: Sande Cromer M.D.   On: 07/03/2023 08:34   MM CLIP PLACEMENT LEFT Result Date: 07/03/2023 CLINICAL DATA:  Status post stereotactic guided biopsy of indeterminate screen detected calcifications in the LEFT breast. EXAM: 3D DIAGNOSTIC LEFT MAMMOGRAM POST STEREOTACTIC BIOPSY COMPARISON:  Previous exam(s). ACR Breast Density Category b: There are scattered areas of fibroglandular density. FINDINGS: 3D Mammographic images  were obtained following stereotactic guided biopsy of calcifications in the upper-outer left breast posterior depth. The ribbon shaped biopsy marking clip is in expected position at the site of biopsy. IMPRESSION: Appropriate positioning of the RIBBON shaped biopsy marking clip at the site of biopsy in the LEFT breast upper outer quadrant. Final Assessment: Post Procedure Mammograms for Marker Placement Electronically Signed   By: Sande Cromer M.D.   On: 07/03/2023 08:33   MM 3D DIAGNOSTIC MAMMOGRAM UNILATERAL LEFT BREAST Result Date: 07/01/2023 CLINICAL DATA:  Screening recall for possible LEFT breast calcifications. EXAM: DIGITAL DIAGNOSTIC UNILATERAL LEFT MAMMOGRAM WITH TOMOSYNTHESIS AND CAD TECHNIQUE: Left digital diagnostic mammography and breast tomosynthesis was performed. The images were evaluated with computer-aided  detection. COMPARISON:  Previous exam(s). ACR Breast Density Category c: The breasts are heterogeneously dense, which may obscure small masses. FINDINGS: Spot magnification views demonstrate a 5 mm group of amorphous calcification in the upper-outer left breast middle depth. This corresponds with the screening mammogram finding and is new compared to prior examinations. No additional new suspicious mass, calcification, or other findings are identified in the left breast. IMPRESSION: LEFT breast 5 mm group of amorphous calcification in the upper-outer quadrant is low suspicion for malignancy. Recommend further assessment with stereotactic guided biopsy. RECOMMENDATION: LEFT breast stereotactic guided biopsy (1 site). I have discussed the findings and recommendations with the patient. The biopsy procedure was discussed with the patient and questions were answered. Patient expressed their understanding of the biopsy recommendation. Patient will be scheduled for biopsy at her earliest convenience by the schedulers. Ordering provider will be notified. If applicable, a reminder letter will be sent to the patient regarding the next appointment. BI-RADS CATEGORY  4: Suspicious. Electronically Signed   By: Sande Cromer M.D.   On: 07/01/2023 15:35   MM 3D SCREENING MAMMOGRAM BILATERAL BREAST Result Date: 06/29/2023 CLINICAL DATA:  Screening. EXAM: DIGITAL SCREENING BILATERAL MAMMOGRAM WITH TOMOSYNTHESIS AND CAD TECHNIQUE: Bilateral screening digital craniocaudal and mediolateral oblique mammograms were obtained. Bilateral screening digital breast tomosynthesis was performed. The images were evaluated with computer-aided detection. COMPARISON:  Previous exam(s). ACR Breast Density Category c: The breasts are heterogeneously dense, which may obscure small masses. FINDINGS: In the left breast, calcifications warrant further evaluation. In the right breast, no findings suspicious for malignancy. IMPRESSION: Further  evaluation is suggested for calcifications in the left breast. RECOMMENDATION: Diagnostic mammogram of the left breast. (Code:[id number]-[id number]) The patient will be contacted regarding the findings, and additional imaging will be scheduled. BI-RADS CATEGORY  0: Incomplete: Need additional imaging evaluation. Electronically Signed   By: Sundra Engel M.D.   On: 06/29/2023 08:17

## 2023-07-09 NOTE — Assessment & Plan Note (Signed)
 Imaging and biopsy results were discussed with patient.  Left breast high grade DCIS. The DCIS diagnosis and care plan were discussed with patient in detail.  We discussed that DCIS is a non invasive breast cancer,  cancer is only in the duct and has not spread into the tissues around it. 10% high grade DCIS may have invasive component on final surgical resection specimen.  Recommend lumpectomy.  She has discussed with surgery.  ER status will be added to final pathology.  She will need adjuvant radiation and if ER+, will need adjuvant endocrine therapy.

## 2023-07-09 NOTE — H&P (Signed)
 History of Present Illness Laurie Daniels is a 68 year old female who presents for evaluation and surgical consultation for left breast cancer.  She underwent a screening mammogram on June 24, 2023, which revealed possible calcifications in the left breast. A diagnostic mammogram and breast ultrasound confirmed a 5 mm group of amorphous calcifications in the upper outer quadrant of the left breast. A biopsy of these calcifications was performed, which showed ductal carcinoma in situ, high grade. The report noted that focal invasion could not be entirely ruled out.  She undergoes regular annual mammograms and was called back for additional imaging after the initial screening. She describes the biopsy experience as 'scary' and 'hurting' when the needle was inserted, but she managed through it.  She has no family history of breast cancer, although her father had lung cancer. She had her first menstrual cycle at around [age] years old and has had two pregnancies, resulting in two sons. She denies any use of hormone therapies post-menopause.  She lives by herself, with her 71 year old granddaughter residing with her. She has seven sisters and describes them as being close, providing a support network.      PAST MEDICAL HISTORY:  Past Medical History:  Diagnosis Date   Atherosclerosis of aorta ()    Breast cancer (CMS/HHS-HCC) 07/2023   Lt -- DCIS   Depressed    GAD (generalized anxiety disorder)    Insomnia    Irritable bowel syndrome with constipation    Migraine    Thyroid  nodules    Type 2 diabetes mellitus (CMS/HHS-HCC)         PAST SURGICAL HISTORY:   History reviewed. No pertinent surgical history.       MEDICATIONS:  Outpatient Encounter Medications as of 07/09/2023  Medication Sig Dispense Refill   aspirin 81 MG EC tablet Take 81 mg by mouth once daily.     atorvastatin  (LIPITOR) 40 MG tablet Take 40 mg by mouth once daily     cholecalciferol (VITAMIN D3) 2,000 unit capsule  Take 2,000 Units by mouth once daily     escitalopram  oxalate (LEXAPRO ) 10 MG tablet Take 10 mg by mouth once daily     JANUVIA  100 mg tablet Take 100 mg by mouth once daily     linaCLOtide  (LINZESS ) 72 mcg capsule Take 72 mcg by mouth once daily     losartan  (COZAAR ) 50 MG tablet Take 50 mg by mouth once daily     mirtazapine  (REMERON ) 15 MG tablet Take 1 tablet by mouth at bedtime     multivitamin tablet Take 1 tablet by mouth once daily.     omeprazole  (PRILOSEC) 20 MG DR capsule Take 1 capsule by mouth once daily     sertraline  (ZOLOFT ) 50 MG tablet Take 50 mg by mouth once daily     losartan  (COZAAR ) 25 MG tablet Take 25 mg by mouth once daily (Patient not taking: Reported on 07/09/2023)     No facility-administered encounter medications on file as of 07/09/2023.     ALLERGIES:   Patient has no known allergies.   SOCIAL HISTORY:  Social History   Socioeconomic History   Marital status: Single  Tobacco Use   Smoking status: Never   Smokeless tobacco: Never  Vaping Use   Vaping status: Never Used  Substance and Sexual Activity   Alcohol use: No   Social Drivers of Corporate investment banker Strain: High Risk (07/09/2023)   Overall Financial Resource Strain (CARDIA)  Difficulty of Paying Living Expenses: Hard  Food Insecurity: Food Insecurity Present (07/09/2023)   Hunger Vital Sign    Worried About Running Out of Food in the Last Year: Sometimes true    Ran Out of Food in the Last Year: Never true  Transportation Needs: No Transportation Needs (07/09/2023)   PRAPARE - Administrator, Civil Service (Medical): No    Lack of Transportation (Non-Medical): No    FAMILY HISTORY:  Family History  Problem Relation Name Age of Onset   Diabetes Mother         amputations r/t DM   Bone cancer Mother     Lung cancer Father     Diabetes Sister     No Known Problems Brother     No Known Problems Maternal Grandmother     No Known Problems Maternal Grandfather     No  Known Problems Paternal Grandmother     No Known Problems Paternal Grandfather     Thyroid  disease Sister     Myocardial Infarction (Heart attack) Maternal Aunt     Myocardial Infarction (Heart attack) Maternal Aunt     Myocardial Infarction (Heart attack) Maternal Aunt     Myocardial Infarction (Heart attack) Maternal Aunt     Myocardial Infarction (Heart attack) Maternal Aunt       GENERAL REVIEW OF SYSTEMS:   General ROS: negative for - chills, fatigue, fever, weight gain or weight loss Allergy and Immunology ROS: negative for - hives  Hematological and Lymphatic ROS: negative for - bleeding problems or bruising, negative for palpable nodes Endocrine ROS: negative for - heat or cold intolerance, hair changes Respiratory ROS: negative for - cough, shortness of breath or wheezing Cardiovascular ROS: no chest pain or palpitations GI ROS: negative for nausea, vomiting, abdominal pain, diarrhea, constipation Musculoskeletal ROS: negative for - joint swelling or muscle pain Neurological ROS: negative for - confusion, syncope Dermatological ROS: negative for pruritus and rash  PHYSICAL EXAM:  Vitals:   07/09/23 0803  BP: 138/76  Pulse: 74  .  Ht:160 cm (5\' 3" ) Wt:66.7 kg (147 lb) ZOX:WRUE surface area is 1.72 meters squared. Body mass index is 26.04 kg/m.[name]   GENERAL: Alert, active, oriented x3  HEENT: Pupils equal reactive to light. Extraocular movements are intact. Sclera clear. Palpebral conjunctiva normal red color.Pharynx clear.  NECK: Supple with no palpable mass and no adenopathy.  LUNGS: Sound clear with no rales rhonchi or wheezes.  HEART: Regular rhythm S1 and S2 without murmur.  BREAST: Both breast examined in the sitting and supine position.  There is no palpable masses, skin changes, nipple retraction or nipple discharge.  EXTREMITIES: Well-developed well-nourished symmetrical with no dependent edema.  NEUROLOGICAL: Awake alert oriented, facial expression  symmetrical, moving all extremities.   Results RADIOLOGY (I personally evaluated this images) Screening mammogram: Possible calcification on the left breast (06/24/2023) Diagnostic mammogram: 5 mm group of amorphous calcification in the upper outer quadrant of the left breast Breast ultrasound: 5 mm group of amorphous calcification in the upper outer quadrant of the left breast  PATHOLOGY Breast biopsy: Ductal carcinoma in situ, high grade. 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 It is high grade with potential microinvasion, suggesting possible stage I breast cancer. Surgical excision is planned to ensure clear margins and  address progression risk. DCIS is stage 0 breast cancer, typically non-metastatic, but microinvasion requires further intervention. A lumpectomy is preferred over mastectomy due to the lesion's small size and DCIS nature. The surgical procedure, risks, and benefits, including potential re-excision if margins are positive, were discussed. The rationale for lymph node evaluation during surgery to address possible invasion was explained. Schedule lumpectomy with sentinel lymph node biopsy. Educated on postoperative care: ice packs, sports bra, swelling, and pain management. Advise on recovery: 1-2 weeks, with potential 2 weeks off work due to physical job demands. Coordinate with oncology for potential adjuvant radiation and hormonal therapy post-surgery.   Ductal carcinoma in situ (DCIS) of left breast [D05.12]          Patient verbalized understanding, all questions were answered, and were agreeable with the plan outlined above.   Eldred Grego, MD  Electronically signed by Eldred Grego, MD

## 2023-07-10 ENCOUNTER — Other Ambulatory Visit: Payer: Self-pay | Admitting: General Surgery

## 2023-07-10 DIAGNOSIS — D0512 Intraductal carcinoma in situ of left breast: Secondary | ICD-10-CM

## 2023-07-14 ENCOUNTER — Encounter
Admission: RE | Admit: 2023-07-14 | Discharge: 2023-07-14 | Disposition: A | Discharge status: Home / Self Care | Source: Ambulatory Visit | Attending: General Surgery | Admitting: General Surgery

## 2023-07-14 ENCOUNTER — Other Ambulatory Visit: Payer: Self-pay

## 2023-07-14 ENCOUNTER — Ambulatory Visit
Admission: RE | Admit: 2023-07-14 | Discharge: 2023-07-14 | Disposition: A | Discharge status: Home / Self Care | Source: Ambulatory Visit | Attending: General Surgery | Admitting: General Surgery

## 2023-07-14 VITALS — Ht 63.0 in | Wt 150.0 lb

## 2023-07-14 DIAGNOSIS — D0512 Intraductal carcinoma in situ of left breast: Secondary | ICD-10-CM | POA: Insufficient documentation

## 2023-07-14 DIAGNOSIS — E1129 Type 2 diabetes mellitus with other diabetic kidney complication: Secondary | ICD-10-CM | POA: Diagnosis present

## 2023-07-14 DIAGNOSIS — R809 Proteinuria, unspecified: Secondary | ICD-10-CM | POA: Diagnosis present

## 2023-07-14 DIAGNOSIS — R9431 Abnormal electrocardiogram [ECG] [EKG]: Secondary | ICD-10-CM | POA: Insufficient documentation

## 2023-07-14 DIAGNOSIS — E119 Type 2 diabetes mellitus without complications: Secondary | ICD-10-CM

## 2023-07-14 DIAGNOSIS — Z01810 Encounter for preprocedural cardiovascular examination: Secondary | ICD-10-CM | POA: Insufficient documentation

## 2023-07-14 HISTORY — DX: Type 2 diabetes mellitus without complications: E11.9

## 2023-07-14 HISTORY — PX: BREAST BIOPSY: SHX20

## 2023-07-14 MED ORDER — LIDOCAINE HCL 1 % IJ SOLN
10.0000 mL | Freq: Once | INTRAMUSCULAR | Status: AC
  Administered 2023-07-14: 10 mL
  Filled 2023-07-14: qty 10

## 2023-07-14 MED ORDER — DEXCOM G7 SENSOR MISC: 1.0000 | 5 refills | Status: AC | PRN

## 2023-07-14 NOTE — Patient Instructions (Addendum)
 Your procedure is scheduled on: Report to the Registration Desk on the 1st floor of the [facility]. If your arrival time is 7:45 am.  REMEMBER: Instructions that are not followed completely may result in serious medical risk, up to and including death; or upon the discretion of your surgeon and anesthesiologist your surgery may need to be rescheduled.  Do not eat food after midnight the night before surgery.  No gum chewing or hard candies.  You may however, drink Water liquids up to 2 hours before you are scheduled to arrive for your surgery. Do not drink anything within 2 hours of your scheduled arrival time.  One week prior to surgery: Stop Anti-inflammatories (NSAIDS) such as Advil, Aleve, Ibuprofen, Motrin, Naproxen, Naprosyn and Aspirin based products such as Excedrin, Goody's Powder, BC Powder. Stop ANY OVER THE COUNTER supplements until after surgery.  You may however, continue to take Tylenol if needed for pain up until the day of surgery.   Continue taking all of your other prescription medications up until the day of surgery.  ON THE DAY OF SURGERY ONLY TAKE THESE MEDICATIONS WITH SIPS OF WATER:  atorvastatin  (LIPITOR ) omeprazole  (PRILOSEC)  sertraline  (ZOLOFT )    No Alcohol for 24 hours before or after surgery.  No Smoking including e-cigarettes for 24 hours before surgery.  No chewable tobacco products for at least 6 hours before surgery.  No nicotine patches on the day of surgery.  Do not use any "recreational" drugs for at least a week (preferably 2 weeks) before your surgery.  Please be advised that the combination of cocaine and anesthesia may have negative outcomes, up to and including death. If you test positive for cocaine, your surgery will be cancelled.   On the morning of surgery brush your teeth with toothpaste and water, you may rinse your mouth with mouthwash if you wish. Do not swallow any toothpaste or mouthwash.  Use CHG Soap or wipes as  directed on instruction sheet.  Do not wear jewelry, make-up, hairpins, clips or nail polish.  For welded (permanent) jewelry: bracelets, anklets, waist bands, etc.  Please have this removed prior to surgery.  If it is not removed, there is a chance that hospital personnel will need to cut it off on the day of surgery.  Do not wear lotions, powders, or perfumes.   Do not shave body hair from the neck down 48 hours before surgery.  Contact lenses, hearing aids and dentures may not be worn into surgery.  Do not bring valuables to the hospital. [facility] is not responsible for any missing/lost belongings or valuables.   Notify your doctor if there is any change in your medical condition (cold, fever, infection).  Wear comfortable clothing (specific to your surgery type) to the hospital.   After surgery, you can help prevent lung complications by doing breathing exercises.  Take deep breaths and cough every 1-2 hours.   If you are being discharged the day of surgery, you will not be allowed to drive home. You will need a responsible individual to drive you home and stay with you for 24 hours after surgery.   If you are taking public transportation, you will need to have a responsible individual with you.  Please call the Pre-admissions Testing Dept. at [phone] if you have any questions about these instructions.  Surgery Visitation Policy:  Patients having surgery or a procedure may have two visitors.  Children under the age of 33 must have an adult with  them who is not the patient.     Preparing for Surgery with CHLORHEXIDINE GLUCONATE (CHG) Soap  Chlorhexidine Gluconate (CHG) Soap  o An antiseptic cleaner that kills germs and bonds with the skin to continue killing germs even after washing  o Used for showering the night before surgery and morning of surgery  Before surgery, you can play an important role by reducing the number of germs on your skin.  CHG  (Chlorhexidine gluconate) soap is an antiseptic cleanser which kills germs and bonds with the skin to continue killing germs even after washing.  Please do not use if you have an allergy to CHG or antibacterial soaps. If your skin becomes reddened/irritated stop using the CHG.  1. Shower the NIGHT BEFORE SURGERY and the MORNING OF SURGERY with CHG soap.  2. If you choose to wash your hair, wash your hair first as usual with your normal shampoo.  3. After shampooing, rinse your hair and body thoroughly to remove the shampoo.  4. Use CHG as you would any other liquid soap. You can apply CHG directly to the skin and wash gently with a scrungie or a clean washcloth.  5. Apply the CHG soap to your body only from the neck down. Do not use on open wounds or open sores. Avoid contact with your eyes, ears, mouth, and genitals (private parts). Wash face and genitals (private parts) with your normal soap.  6. Wash thoroughly, paying special attention to the area where your surgery will be performed.  7. Thoroughly rinse your body with warm water.  8. Do not shower/wash with your normal soap after using and rinsing off the CHG soap.  9. Pat yourself dry with a clean towel.  10. Wear clean pajamas to bed the night before surgery.  12. Place clean sheets on your bed the night of your first shower and do not sleep with pets.  13. Shower again with the CHG soap on the day of surgery prior to arriving at the hospital.  14. Do not apply any deodorants/lotions/powders.  15. Please wear clean clothes to the hospital.

## 2023-07-16 ENCOUNTER — Telehealth: Payer: Self-pay | Admitting: Pharmacy Technician

## 2023-07-16 ENCOUNTER — Other Ambulatory Visit (HOSPITAL_COMMUNITY): Payer: Self-pay

## 2023-07-16 NOTE — Telephone Encounter (Signed)
 Pharmacy Patient Advocate Encounter  Received notification from [employer] that Prior Authorization for [facility] G7 SENSOR has been DENIED.  Full denial letter will be uploaded to the media tab. See denial reason below.     PA #/Case ID/Reference #: [id number]

## 2023-07-16 NOTE — Telephone Encounter (Signed)
 Pharmacy Patient Advocate Encounter   Received notification from CoverMyMeds that prior authorization for Dexcom G7 Sensor is required/requested.   Insurance verification completed.   The patient is insured through [employer] .   Per test claim: PA required; PA started via CoverMyMeds. KEY [id number] . Waiting for clinical questions to populate.

## 2023-07-17 ENCOUNTER — Ambulatory Visit: Admitting: Certified Registered Nurse Anesthetist

## 2023-07-17 ENCOUNTER — Other Ambulatory Visit: Payer: Self-pay

## 2023-07-17 ENCOUNTER — Encounter
Admission: RE | Disposition: A | Discharge status: Home / Self Care | Payer: Self-pay | Source: Home / Self Care | Attending: General Surgery

## 2023-07-17 ENCOUNTER — Ambulatory Visit
Admission: RE | Admit: 2023-07-17 | Discharge: 2023-07-17 | Disposition: A | Discharge status: Home / Self Care | Source: Ambulatory Visit | Attending: General Surgery | Admitting: General Surgery

## 2023-07-17 ENCOUNTER — Ambulatory Visit
Admission: RE | Admit: 2023-07-17 | Discharge: 2023-07-17 | Disposition: A | Discharge status: Home / Self Care | Attending: General Surgery | Admitting: General Surgery

## 2023-07-17 ENCOUNTER — Encounter: Payer: Self-pay | Admitting: General Surgery

## 2023-07-17 DIAGNOSIS — F411 Generalized anxiety disorder: Secondary | ICD-10-CM | POA: Insufficient documentation

## 2023-07-17 DIAGNOSIS — Z5986 Financial insecurity: Secondary | ICD-10-CM | POA: Diagnosis not present

## 2023-07-17 DIAGNOSIS — N6092 Unspecified benign mammary dysplasia of left breast: Secondary | ICD-10-CM | POA: Diagnosis not present

## 2023-07-17 DIAGNOSIS — F32A Depression, unspecified: Secondary | ICD-10-CM | POA: Insufficient documentation

## 2023-07-17 DIAGNOSIS — D0512 Intraductal carcinoma in situ of left breast: Secondary | ICD-10-CM | POA: Insufficient documentation

## 2023-07-17 DIAGNOSIS — E1151 Type 2 diabetes mellitus with diabetic peripheral angiopathy without gangrene: Secondary | ICD-10-CM | POA: Insufficient documentation

## 2023-07-17 DIAGNOSIS — Z801 Family history of malignant neoplasm of trachea, bronchus and lung: Secondary | ICD-10-CM | POA: Insufficient documentation

## 2023-07-17 DIAGNOSIS — Z7984 Long term (current) use of oral hypoglycemic drugs: Secondary | ICD-10-CM | POA: Diagnosis not present

## 2023-07-17 DIAGNOSIS — E119 Type 2 diabetes mellitus without complications: Secondary | ICD-10-CM

## 2023-07-17 DIAGNOSIS — E1140 Type 2 diabetes mellitus with diabetic neuropathy, unspecified: Secondary | ICD-10-CM | POA: Insufficient documentation

## 2023-07-17 DIAGNOSIS — R809 Proteinuria, unspecified: Secondary | ICD-10-CM

## 2023-07-17 DIAGNOSIS — I10 Essential (primary) hypertension: Secondary | ICD-10-CM | POA: Diagnosis not present

## 2023-07-17 HISTORY — PX: AXILLARY SENTINEL NODE BIOPSY: SHX5738

## 2023-07-17 HISTORY — PX: BREAST LUMPECTOMY WITH RADIO FREQUENCY LOCALIZER: SHX6897

## 2023-07-17 LAB — GLUCOSE, CAPILLARY
Glucose-Capillary: 125 mg/dL — ABNORMAL HIGH (ref 70–99)
Glucose-Capillary: 164 mg/dL — ABNORMAL HIGH (ref 70–99)

## 2023-07-17 SURGERY — BREAST LUMPECTOMY WITH RADIO FREQUENCY LOCALIZER
Anesthesia: General | Site: Breast | Laterality: Left

## 2023-07-17 MED ORDER — ONDANSETRON HCL 4 MG/2ML IJ SOLN
INTRAMUSCULAR | Status: AC
  Filled 2023-07-17: qty 2

## 2023-07-17 MED ORDER — FENTANYL CITRATE (PF) 100 MCG/2ML IJ SOLN
INTRAMUSCULAR | Status: AC
  Filled 2023-07-17: qty 2

## 2023-07-17 MED ORDER — HYDROCODONE-ACETAMINOPHEN 5-325 MG PO TABS: 1.0000 | ORAL_TABLET | Freq: Four times a day (QID) | ORAL | 0 refills | Status: AC | PRN

## 2023-07-17 MED ORDER — BUPIVACAINE-EPINEPHRINE (PF) 0.5% -1:200000 IJ SOLN
INTRAMUSCULAR | Status: DC | PRN
  Administered 2023-07-17: 20 mL

## 2023-07-17 MED ORDER — CHLORHEXIDINE GLUCONATE 0.12 % MT SOLN
15.0000 mL | Freq: Once | OROMUCOSAL | Status: AC
  Administered 2023-07-17: 15 mL via OROMUCOSAL

## 2023-07-17 MED ORDER — MIDAZOLAM HCL 2 MG/2ML IJ SOLN
INTRAMUSCULAR | Status: DC | PRN
  Administered 2023-07-17 (×2): 1 mg via INTRAVENOUS

## 2023-07-17 MED ORDER — ONDANSETRON HCL 4 MG/2ML IJ SOLN
INTRAMUSCULAR | Status: DC | PRN
  Administered 2023-07-17: 4 mg via INTRAVENOUS

## 2023-07-17 MED ORDER — LIDOCAINE HCL (PF) 2 % IJ SOLN
INTRAMUSCULAR | Status: AC
  Filled 2023-07-17: qty 5

## 2023-07-17 MED ORDER — PROPOFOL 10 MG/ML IV BOLUS
INTRAVENOUS | Status: DC | PRN
  Administered 2023-07-17: 120 mg via INTRAVENOUS

## 2023-07-17 MED ORDER — CEFAZOLIN SODIUM-DEXTROSE 2-4 GM/100ML-% IV SOLN
2.0000 g | INTRAVENOUS | Status: AC
  Administered 2023-07-17: 2 g via INTRAVENOUS

## 2023-07-17 MED ORDER — ACETAMINOPHEN 10 MG/ML IV SOLN
INTRAVENOUS | Status: DC | PRN
  Administered 2023-07-17: 1000 mg via INTRAVENOUS

## 2023-07-17 MED ORDER — CHLORHEXIDINE GLUCONATE 0.12 % MT SOLN
OROMUCOSAL | Status: AC
  Filled 2023-07-17: qty 15

## 2023-07-17 MED ORDER — MIDAZOLAM HCL 2 MG/2ML IJ SOLN
INTRAMUSCULAR | Status: AC
  Filled 2023-07-17: qty 2

## 2023-07-17 MED ORDER — STERILE WATER FOR IRRIGATION IR SOLN
Status: DC | PRN
  Administered 2023-07-17: 500 mL

## 2023-07-17 MED ORDER — DEXAMETHASONE SODIUM PHOSPHATE 10 MG/ML IJ SOLN
INTRAMUSCULAR | Status: AC
  Filled 2023-07-17: qty 1

## 2023-07-17 MED ORDER — LIDOCAINE HCL (CARDIAC) PF 100 MG/5ML IV SOSY
PREFILLED_SYRINGE | INTRAVENOUS | Status: DC | PRN
  Administered 2023-07-17: 80 mg via INTRAVENOUS

## 2023-07-17 MED ORDER — BUPIVACAINE-EPINEPHRINE (PF) 0.5% -1:200000 IJ SOLN
INTRAMUSCULAR | Status: AC
  Filled 2023-07-17: qty 30

## 2023-07-17 MED ORDER — PROPOFOL 10 MG/ML IV BOLUS
INTRAVENOUS | Status: AC
  Filled 2023-07-17: qty 20

## 2023-07-17 MED ORDER — ORAL CARE MOUTH RINSE: 15.0000 mL | Freq: Once | OROMUCOSAL | Status: AC

## 2023-07-17 MED ORDER — LACTATED RINGERS IV SOLN: INTRAVENOUS | Status: DC | PRN

## 2023-07-17 MED ORDER — FENTANYL CITRATE (PF) 100 MCG/2ML IJ SOLN: 25.0000 ug | INTRAMUSCULAR | Status: DC | PRN

## 2023-07-17 MED ORDER — DEXAMETHASONE SODIUM PHOSPHATE 10 MG/ML IJ SOLN
INTRAMUSCULAR | Status: DC | PRN
  Administered 2023-07-17: 10 mg via INTRAVENOUS

## 2023-07-17 MED ORDER — ACETAMINOPHEN 10 MG/ML IV SOLN
INTRAVENOUS | Status: AC
  Filled 2023-07-17: qty 100

## 2023-07-17 MED ORDER — DROPERIDOL 2.5 MG/ML IJ SOLN: 0.6250 mg | Freq: Once | INTRAMUSCULAR | Status: DC | PRN

## 2023-07-17 MED ORDER — CEFAZOLIN SODIUM-DEXTROSE 2-4 GM/100ML-% IV SOLN
INTRAVENOUS | Status: AC
  Filled 2023-07-17: qty 100

## 2023-07-17 MED ORDER — TECHNETIUM TC 99M TILMANOCEPT KIT
1.0000 | PACK | Freq: Once | INTRAVENOUS | Status: AC | PRN
  Administered 2023-07-17: 1 via INTRADERMAL

## 2023-07-17 MED ORDER — FENTANYL CITRATE (PF) 100 MCG/2ML IJ SOLN
INTRAMUSCULAR | Status: DC | PRN
  Administered 2023-07-17 (×4): 25 ug via INTRAVENOUS

## 2023-07-17 MED ORDER — GLYCOPYRROLATE 0.2 MG/ML IJ SOLN
INTRAMUSCULAR | Status: DC | PRN
  Administered 2023-07-17: .1 mg via INTRAVENOUS

## 2023-07-17 MED ORDER — GLYCOPYRROLATE 0.2 MG/ML IJ SOLN
INTRAMUSCULAR | Status: AC
  Filled 2023-07-17: qty 1

## 2023-07-17 MED ORDER — SODIUM CHLORIDE 0.9 % IV SOLN: INTRAVENOUS | Status: DC

## 2023-07-17 SURGICAL SUPPLY — 33 items
BLADE SURG 15 STRL LF DISP TIS (BLADE) ×2 IMPLANT
CHLORAPREP W/TINT 26 (MISCELLANEOUS) IMPLANT
COVER PROBE GAMMA FINDER SLV (MISCELLANEOUS) ×2 IMPLANT
DERMABOND ADVANCED .7 DNX12 (GAUZE/BANDAGES/DRESSINGS) ×2 IMPLANT
DEVICE DUBIN SPECIMEN MAMMOGRA (MISCELLANEOUS) ×2 IMPLANT
DRAPE LAPAROTOMY TRNSV 106X77 (MISCELLANEOUS) ×2 IMPLANT
ELECTRODE REM PT RTRN 9FT ADLT (ELECTROSURGICAL) ×2 IMPLANT
GLOVE BIO SURGEON STRL SZ 6.5 (GLOVE) ×2 IMPLANT
GLOVE BIOGEL PI IND STRL 6.5 (GLOVE) ×2 IMPLANT
GLOVE SURG SYN 6.5 ES PF (GLOVE) ×4 IMPLANT
GLOVE SURG SYN 6.5 PF PI (GLOVE) ×4 IMPLANT
GOWN STRL REUS W/ TWL LRG LVL3 (GOWN DISPOSABLE) ×8 IMPLANT
KIT MARKER MARGIN INK (KITS) IMPLANT
KIT TURNOVER KIT A (KITS) ×2 IMPLANT
LABEL OR SOLS (LABEL) ×2 IMPLANT
MANIFOLD NEPTUNE II (INSTRUMENTS) ×2 IMPLANT
MARKER MARGIN CORRECT CLIP (MARKER) IMPLANT
NDL HYPO 22X1.5 SAFETY MO (MISCELLANEOUS) ×2 IMPLANT
NDL SAFETY ECLIPSE 18X1.5 (NEEDLE) IMPLANT
NEEDLE HYPO 22X1.5 SAFETY MO (MISCELLANEOUS) ×2 IMPLANT
PACK BASIN MINOR ARMC (MISCELLANEOUS) ×2 IMPLANT
POWDER SURGICEL 3.0 GRAM (HEMOSTASIS) IMPLANT
RETRACTOR RING XSMALL (MISCELLANEOUS) IMPLANT
SHEATH BREAST BIOPSY SKIN MKR (SHEATH) ×2 IMPLANT
SUT SILK 2 0 SH (SUTURE) IMPLANT
SUT VIC AB 3-0 SH 27X BRD (SUTURE) ×2 IMPLANT
SUTURE MNCRL 4-0 27XMF (SUTURE) ×2 IMPLANT
SYR 10ML LL (SYRINGE) ×2 IMPLANT
SYR BULB IRRIG 60ML STRL (SYRINGE) ×2 IMPLANT
TRAP FLUID SMOKE EVACUATOR (MISCELLANEOUS) ×2 IMPLANT
TRAP NEPTUNE SPECIMEN COLLECT (MISCELLANEOUS) ×2 IMPLANT
WATER STERILE IRR 1000ML POUR (IV SOLUTION) ×2 IMPLANT
WATER STERILE IRR 500ML POUR (IV SOLUTION) ×2 IMPLANT

## 2023-07-17 NOTE — Anesthesia Procedure Notes (Signed)
 Procedure Name: LMA Insertion Date/Time: 07/17/2023 9:25 AM  Performed by: Delice Felt, CRNAPre-anesthesia Checklist: Patient identified, Patient being monitored, Timeout performed, Emergency Drugs available and Suction available Patient Re-evaluated:Patient Re-evaluated prior to induction Oxygen Delivery Method: Circle system utilized Preoxygenation: Pre-oxygenation with 100% oxygen Induction Type: IV induction Ventilation: Mask ventilation without difficulty LMA: LMA inserted and LMA with gastric port inserted LMA Size: 4.0 Tube type: Oral Number of attempts: 1 Placement Confirmation: positive ETCO2 and breath sounds checked- equal and bilateral Tube secured with: Tape Dental Injury: Teeth and Oropharynx as per pre-operative assessment

## 2023-07-17 NOTE — Anesthesia Preprocedure Evaluation (Signed)
 Anesthesia Evaluation  Patient identified by MRN, date of birth, ID band Patient awake    Reviewed: Allergy & Precautions, H&P , NPO status , Patient's Chart, lab work & pertinent test results, reviewed documented beta blocker date and time   History of Anesthesia Complications Negative for: history of anesthetic complications  Airway Mallampati: II  TM Distance: >3 FB Neck ROM: full    Dental  (+) Dental Advidsory Given, Partial Upper, Teeth Intact   Pulmonary neg pulmonary ROS   Pulmonary exam normal        Cardiovascular Exercise Tolerance: Good hypertension, (-) angina + Peripheral Vascular Disease  (-) CAD, (-) Past MI, (-) Cardiac Stents and (-) CABG (-) dysrhythmias (-) Valvular Problems/Murmurs     Neuro/Psych neg Seizures PSYCHIATRIC DISORDERS Anxiety Depression     Neuromuscular disease (neuropathy) negative neurological ROS     GI/Hepatic negative GI ROS, Neg liver ROS,,,  Endo/Other  diabetes    Renal/GU negative Renal ROS  negative genitourinary   Musculoskeletal   Abdominal   Peds  Hematology negative hematology ROS (+)   Anesthesia Other Findings Past Medical History: No date: Anxiety and depression No date: Atherosclerosis of aorta (HCC) No date: CTS (carpal tunnel syndrome) No date: Depressive disorder No date: Diabetes mellitus without complication (HCC) No date: Diabetic neuropathy (HCC) No date: Hyperactivity of bladder No date: Hypertension No date: Insomnia No date: Lumbar sprain No date: Ovarian failure No date: Rhinitis No date: Vaginitis and vulvovaginitis   Reproductive/Obstetrics negative OB ROS                             Anesthesia Physical Anesthesia Plan  ASA: II  Anesthesia Plan: General   Post-op Pain Management:    Induction: Intravenous  PONV Risk Score and Plan: 3 and Ondansetron, Dexamethasone, Midazolam  and Treatment may vary due to  age or medical condition  Airway Management Planned: LMA  Additional Equipment:   Intra-op Plan:   Post-operative Plan: Extubation in OR  Informed Consent: I have reviewed the patients History and Physical, chart, labs and discussed the procedure including the risks, benefits and alternatives for the proposed anesthesia with the patient or authorized representative who has indicated his/her understanding and acceptance.     Dental Advisory Given  Plan Discussed with: Anesthesiologist, CRNA and Surgeon  Anesthesia Plan Comments:         Anesthesia Quick Evaluation

## 2023-07-17 NOTE — Interval H&P Note (Signed)
 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  The various methods of treatment have been discussed with the patient and family. After consideration of risks, benefits and other options for treatment, the patient has consented to  Procedure(s): BREAST LUMPECTOMY WITH RADIO FREQUENCY LOCALIZER (Left) BIOPSY, LYMPH NODE, SENTINEL, AXILLARY (Left) as a surgical intervention.  The patient's history has been reviewed, patient examined, no change in status, stable for surgery.  I have reviewed the patient's chart and labs.  Questions were answered to the patient's satisfaction.     Eldred Grego

## 2023-07-17 NOTE — Op Note (Signed)
 Preoperative diagnosis: Left breast carcinoma.  Postoperative diagnosis: Same.   Procedure: Left radiofrequency tag-localized partial mastectomy.                      Left Axillary Sentinel Lymph node biopsy  Anesthesia: GETA  Surgeon: Dr. Dortha Gauss  Wound Classification: Clean  Indications: Patient is a 68 y.o. female with a nonpalpable left breast mass noted on mammography with core biopsy demonstrating invasive mammary carcinoma requires radiofrequency tag-localized partial mastectomy for treatment with sentinel lymph node biopsy.   Findings: 1. Specimen mammography shows marker and tag on specimen 2. Pathology call refers gross examination of margins was close to anterior and posterior margins 3. No other palpable mass or lymph node identified.   Description of procedure: Preoperative radiofrequency tag localization was performed by radiology. In the nuclear medicine suite, the subareolar region was injected with Tc-99 sulfur colloid. Localization studies were reviewed. The patient was taken to the operating room and placed supine on the operating table, and after general anesthesia the left chest and axilla were prepped and draped in the usual sterile fashion. A time-out was completed verifying correct patient, procedure, site, positioning, and implant(s) and/or special equipment prior to beginning this procedure.  By comparing the localization studies and interrogation with [facility] device, the probable trajectory and location of the mass was visualized. A circumareolar skin incision was planned in such a way as to minimize the amount of dissection to reach the mass.  The skin incision was made. Flaps were raised and the location of the tag was confirmed with [facility] device confirmed. A 2-0 silk figure-of-eight stay suture was placed and used for retraction. Dissection was then taken down circumferentially, taking care to include the entire localizing tag and a wide margin of  grossly normal tissue. The specimen and entire localizing tag were removed. The specimen was oriented and sent to radiology with the localization studies. Pathology was concern about close anterior and posterior margin. Re excision of anterior and posterior margin. The wound was irrigated. Hemostasis was checked. The wound was closed with interrupted sutures of 3-0 Vicryl and a subcuticular suture of Monocryl 3-0. No attempt was made to close the dead space.   A hand-held gamma probe was used to identify the location of the hottest spot in the axilla. An incision was made around the caudal axillary hairline. Dissection was carried down until subdermal facias was advanced. The probe was placed and again, the point of maximal count was found. Dissection continue until nodule was identified. The probe was placed in contact with the node. The node was excised in its entirety.  An additional hot spot was detected and the node was excised in similar fashion. No additional hot spots were identified. No clinically abnormal nodes were palpated. The procedure was terminated. Hemostasis was achieved and the wound closed in layers with deep interrupted 3-0 Vicryl and skin was closed with subcuticular suture of Monocryl 3-0.  The patient tolerated the procedure well and was taken to the postanesthesia care unit in stable condition.    Sentinel Node Biopsy Synoptic Operative Report  Operation performed with curative intent:Yes  Tracer(s) used to identify sentinel nodes in the upfront surgery (non-neoadjuvant) setting (select all that apply):Radioactive Tracer  Tracer(s) used to identify sentinel nodes in the neoadjuvant setting (select all that apply):N/A  All nodes (colored or non-colored) present at the end of a dye-filled lymphatic channel were removed:N/A  All significantly radioactive nodes were removed: Yes  All palpable suspicious nodes were removed:N/A  Biopsy-proven positive nodes marked with clips  prior to chemotherapy were identified and removed:N/A  Specimen: Left Breast mass                     Sentinel Lymph nodes #1, #2                    Anterior margin                    Posterior margin  Complications: None  Estimated Blood Loss: 10 mL

## 2023-07-17 NOTE — Transfer of Care (Signed)
 Immediate Anesthesia Transfer of Care Note  Patient: Laurie Daniels  Procedure(s) Performed: LEFT BREAST PARTIAL MASTECTOMY  WITH RADIO FREQUENCY LOCALIZER (Left) BIOPSY, LYMPH NODE, SENTINEL, AXILLARY (Left: Breast)  Patient Location: PACU  Anesthesia Type:General  Level of Consciousness: drowsy  Airway & Oxygen Therapy: Patient Spontanous Breathing and Patient connected to face mask oxygen  Post-op Assessment: Report given to RN and Post -op Vital signs reviewed and stable  Post vital signs: Reviewed and stable  Last Vitals:  Vitals Value Taken Time  BP 183/78 07/17/23 1112  Temp 36.2 C 07/17/23 1112  Pulse 71 07/17/23 1115  Resp 16 07/17/23 1112  SpO2 100 % 07/17/23 1115  Vitals shown include unfiled device data.  Last Pain:  Vitals:   07/17/23 1112  TempSrc:   PainSc: Asleep         Complications: No notable events documented.

## 2023-07-20 ENCOUNTER — Encounter: Payer: Self-pay | Admitting: General Surgery

## 2023-07-20 LAB — SURGICAL PATHOLOGY

## 2023-07-30 ENCOUNTER — Ambulatory Visit (INDEPENDENT_AMBULATORY_CARE_PROVIDER_SITE_OTHER)

## 2023-07-30 DIAGNOSIS — Z0000 Encounter for general adult medical examination without abnormal findings: Secondary | ICD-10-CM | POA: Diagnosis not present

## 2023-07-30 NOTE — Patient Instructions (Signed)
 Laurie Daniels , Thank you for taking time out of your busy schedule to complete your Annual Wellness Visit with me. I enjoyed our conversation and look forward to speaking with you again next year. I, as well as your care team,  appreciate your ongoing commitment to your health goals. Please review the following plan we discussed and let me know if I can assist you in the future.   Follow up Visits: Next Medicare AWV with our clinical staff:   08/12/24 @ 10:10 AM BY PHONE Have you seen your provider in the last 6 months (3 months if uncontrolled diabetes)? Yes   Clinician Recommendations:  Aim for 30 minutes of exercise or brisk walking, 6-8 glasses of water , and 5 servings of fruits and vegetables each day. TAKE CARE!      This is a list of the screening recommended for you and due dates:  Health Maintenance  Topic Date Due   COVID-19 Vaccine (4 - 2024-25 season) 11/02/2022   Yearly kidney health urinalysis for diabetes  08/18/2023   Eye exam for diabetics  09/08/2023   Flu Shot  10/02/2023   Hemoglobin A1C  12/22/2023   Complete foot exam   02/17/2024   Yearly kidney function blood test for diabetes  05/11/2024   Mammogram  06/23/2024   Medicare Annual Wellness Visit  07/29/2024   DTaP/Tdap/Td vaccine (2 - Td or Tdap) 08/12/2025   Colon Cancer Screening  07/23/2027   DEXA scan (bone density measurement)  12/30/2027   Pneumonia Vaccine  Completed   Hepatitis C Screening  Completed   Zoster (Shingles) Vaccine  Completed   HPV Vaccine  Aged Out   Meningitis B Vaccine  Aged Out    Advanced directives: (ACP Link)Information on Advanced Care Planning can be found at [place]  Secretary of State Advance Health Care Directives Advance Health Care Directives. [email or web address]  Advance Care Planning is important because it:  [x]  Makes sure you receive the medical care that is consistent with your values, goals, and preferences  [x]  It provides guidance to your family and loved ones and  reduces their decisional burden about whether or not they are making the right decisions based on your wishes.  Follow the link provided in your after visit summary or read over the paperwork we have mailed to you to help you started getting your Advance Directives in place. If you need assistance in completing these, please reach out to us  so that we can help you!

## 2023-07-30 NOTE — Progress Notes (Signed)
 Subjective:   Laurie Daniels is a 68 y.o. who presents for a Medicare Wellness preventive visit.  As a reminder, Annual Wellness Visits don't include a physical exam, and some assessments may be limited, especially if this visit is performed virtually. We may recommend an in-person follow-up visit with your provider if needed.  Visit Complete: Virtual I connected with  Laurie Daniels on 07/30/23 by a audio enabled telemedicine application and verified that I am speaking with the correct person using two identifiers.  Patient Location: Home  Provider Location: Office/Clinic  I discussed the limitations of evaluation and management by telemedicine. The patient expressed understanding and agreed to proceed.  Vital Signs: Because this visit was a virtual/telehealth visit, some criteria may be missing or patient reported. Any vitals not documented were not able to be obtained and vitals that have been documented are patient reported.  VideoDeclined- This patient declined Librarian, academic. Therefore the visit was completed with audio only.  Persons Participating in Visit: Patient.  AWV Questionnaire: No: Patient Medicare AWV questionnaire was not completed prior to this visit.  Cardiac Risk Factors include: advanced age (>69men, >85 women);diabetes mellitus;dyslipidemia;microalbuminuria     Objective:     Today's Vitals   07/30/23 1251  PainSc: 0-No pain   There is no height or weight on file to calculate BMI.     07/30/2023   12:55 PM 07/17/2023    8:21 AM 07/14/2023   10:38 AM 07/09/2023   11:15 AM 02/26/2021    3:06 PM 07/22/2017   10:25 AM 07/23/2016    2:19 PM  Advanced Directives  Does Patient Have a Medical Advance Directive? No No No No No No No  Would patient like information on creating a medical advance directive? No - Patient declined No - Patient declined No - Patient declined No - Patient declined No - Patient declined No - Patient  declined     Current Medications (verified) Outpatient Encounter Medications as of 07/30/2023  Medication Sig   aspirin 81 MG tablet Take 81 mg by mouth daily.   atorvastatin  (LIPITOR) 40 MG tablet Take 1 tablet (40 mg total) by mouth daily.   Cholecalciferol (VITAMIN D3) 50 MCG (2000 UT) capsule Take 1 capsule (2,000 Units total) by mouth daily.   Continuous Glucose Sensor (DEXCOM G7 SENSOR) MISC 1 applicator by Does not apply route as needed.   JANUVIA  100 MG tablet Take 1 tablet (100 mg total) by mouth daily.   losartan  (COZAAR ) 50 MG tablet Take 1 tablet (50 mg total) by mouth daily.   mirtazapine  (REMERON ) 15 MG tablet TAKE 1 TABLET BY MOUTH EVERYDAY AT BEDTIME   omeprazole  (PRILOSEC) 20 MG capsule Take 1 capsule (20 mg total) by mouth daily.   sertraline  (ZOLOFT ) 50 MG tablet Take 1 tablet (50 mg total) by mouth daily.   vitamin B-12 (CYANOCOBALAMIN ) 100 MCG tablet Take 100 mcg by mouth daily.   No facility-administered encounter medications on file as of 07/30/2023.    Allergies (verified) Patient has no known allergies.   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   Hyperactivity of bladder    Hypertension    Insomnia    Lumbar sprain    Ovarian failure  Rhinitis    Type 2 diabetes mellitus without complication (HCC)    Vaginitis and vulvovaginitis    Past Surgical History:  Procedure Laterality Date   AXILLARY SENTINEL NODE BIOPSY Left 07/17/2023   Procedure: BIOPSY, LYMPH NODE, SENTINEL, AXILLARY;  Surgeon: Eldred Grego, MD;  Location: [facility];  Service: General;  Laterality: Left;   BREAST BIOPSY Left 07/03/2023   Stereo bx, ribbon clip, path pending   BREAST BIOPSY Left 07/03/2023   MM LT BREAST BX W LOC DEV 1ST LESION IMAGE BX SPEC STEREO GUIDE  07/03/2023 [facility]   BREAST BIOPSY  07/14/2023   MM LT BREAST SAVI/RF TAG 1ST LESION MAMMO GUIDE 07/14/2023 [facility]   BREAST LUMPECTOMY WITH RADIO FREQUENCY LOCALIZER Left 07/17/2023   Procedure: LEFT BREAST PARTIAL MASTECTOMY  WITH RADIO FREQUENCY LOCALIZER;  Surgeon: Eldred Grego, MD;  Location: [facility];  Service: General;  Laterality: Left;   CATARACT EXTRACTION Right 08/31/2017   CESAREAN SECTION     COLONOSCOPY  06/17/2007   Dr Ole Berkeley   COLONOSCOPY WITH PROPOFOL  N/A 07/22/2017   Procedure: COLONOSCOPY WITH PROPOFOL ;  Surgeon: Marshall Skeeter, MD;  Location: [facility];  Service: Endoscopy;  Laterality: N/A;   TUBAL LIGATION     vitrectomy Right 07/10/2016   done at [facility] History  Problem Relation Age of Onset   Hypertension Mother    Diabetes Mother    Lung cancer Father    Colon cancer Neg Hx    Social History   Socioeconomic History   Marital status: Divorced    Spouse name: Not on file   Number of children: 2   Years of education: Not on file   Highest education level: 12th grade  Occupational History   Occupation: housekeeping   Tobacco Use   Smoking status: Never   Smokeless tobacco: Never  Vaping Use   Vaping status: Never Used  Substance and Sexual Activity   Alcohol use: No    Alcohol/week: 0.0 standard drinks of alcohol   Drug use: No   Sexual activity: Yes    Partners: Male  Other Topics Concern   Not on file  Social History Narrative   Divorced since 2005, she has a boyfriend but they don't live together      Pt's son and granddaughter lives with her   Social Drivers of Corporate investment banker Strain: Low Risk  (07/30/2023)   Overall Financial Resource Strain (CARDIA)    Difficulty of Paying Living Expenses: Not hard at all  Recent Concern: Financial Resource Strain - High Risk (07/09/2023)   Received [facility]   Overall Financial Resource Strain (CARDIA)    Difficulty of Paying  Living Expenses: Hard  Food Insecurity: No Food Insecurity (07/30/2023)   Hunger Vital Sign    Worried About Running Out of Food in the Last Year: Never true    Ran Out of Food in the Last Year: Never true  Recent Concern: Food Insecurity - Food Insecurity Present (07/09/2023)   [facility]   Hunger Vital Sign    Worried About Running Out of Food in the Last Year: Sometimes true    Ran Out of Food in the Last Year: Never true  Transportation Needs: No Transportation Needs (07/30/2023)   PRAPARE - Administrator, Civil Service (Medical): No    Lack of Transportation (Non-Medical): No  Physical Activity: Insufficiently Active (07/30/2023)   Exercise Vital Sign    Days  of Exercise per Week: 3 days    Minutes of Exercise per Session: 30 min  Stress: No Stress Concern Present (07/30/2023)   [employer] of Occupational Health - Occupational Stress Questionnaire    Feeling of Stress : Not at all  Social Connections: Moderately Isolated (07/30/2023)   Social Connection and Isolation Panel [NHANES]    Frequency of Communication with Friends and Family: More than three times a week    Frequency of Social Gatherings with Friends and Family: More than three times a week    Attends Religious Services: More than 4 times per year    Active Member of [employer] or Organizations: No    Attends Engineer, structural: Never    Marital Status: Divorced    Tobacco Counseling Counseling given: Not Answered    Clinical Intake:  Pre-visit preparation completed: Yes  Pain : No/denies pain Pain Score: 0-No pain     BMI - recorded: 26.2 Nutritional Status: BMI 25 -29 Overweight Nutritional Risks: None Diabetes: Yes CBG done?: No Did pt. bring in CBG monitor from home?: No  Lab Results  Component Value Date   HGBA1C 7.1 (A) 06/22/2023   HGBA1C 6.6 (A) 02/17/2023   HGBA1C 6.7 (A) 08/18/2022     How often do you need to have someone help you  when you read instructions, pamphlets, or other written materials from your doctor or pharmacy?: 1 - Never  Interpreter Needed?: No  Information entered by :: Dellie Fergusson, LPN   Activities of Daily Living    07/30/2023   12:56 PM 07/14/2023   10:42 AM  In your present state of health, do you have any difficulty performing the following activities:  Hearing? 0   Vision? 0   Difficulty concentrating or making decisions? 0   Walking or climbing stairs? 0   Dressing or bathing? 0   Doing errands, shopping? 0 0  Preparing Food and eating ? N   Using the Toilet? N   In the past six months, have you accidently leaked urine? N   Do you have problems with loss of bowel control? N   Managing your Medications? N   Managing your Finances? N   Housekeeping or managing your Housekeeping? N     Patient Care Team: [name], [name], MD as PCP - General (Family Medicine) Waverly Hageman, RN as Oncology Nurse Navigator Timmy Forbes, MD as Consulting Physician (Oncology) Glenis Langdon, MD as Consulting Physician (Radiation Oncology) Pa, [facility]  Indicate any recent Medical Services you may have received from other than [facility] providers in the past year (date may be approximate).     Assessment:    This is a routine wellness examination for Laurie Daniels.  Hearing/Vision screen Hearing Screening - Comments:: NO AIDS Vision Screening - Comments:: WEARS GLASSES ALL THE TIME- PATTY VISION   Goals Addressed             This Visit's Progress    DIET - EAT MORE FRUITS AND VEGETABLES         Depression Screen     07/30/2023   12:53 PM 06/22/2023    4:09 PM 02/17/2023    3:06 PM 08/18/2022    1:52 PM 03/28/2022    8:03 AM 03/04/2022   12:49 PM 02/11/2022    3:37 PM  PHQ 2/9 Scores  PHQ - 2 Score 0 2 2 0 0 0 0  PHQ- 9 Score 0 8 9 5  0 0 0  Fall Risk     07/30/2023   12:56 PM 06/22/2023    3:41 PM 02/17/2023    3:06 PM 08/18/2022    1:51 PM 03/28/2022    8:03 AM  Fall Risk    Falls in the past year? 0 0 0 0 0  Number falls in past yr: 0 0   0  Injury with Fall? 0 0   0  Risk for fall due to : No Fall Risks  No Fall Risks No Fall Risks No Fall Risks  Follow up Falls evaluation completed Falls evaluation completed Falls prevention discussed Falls prevention discussed Falls prevention discussed;Education provided;Falls evaluation completed    MEDICARE RISK AT HOME:  Medicare Risk at Home Any stairs in or around the home?: No If so, are there any without handrails?: No Home free of loose throw rugs in walkways, pet beds, electrical cords, etc?: Yes Adequate lighting in your home to reduce risk of falls?: Yes Life alert?: No Use of a cane, walker or w/c?: No Grab bars in the bathroom?: No Shower chair or bench in shower?: No Elevated toilet seat or a handicapped toilet?: No  TIMED UP AND GO:  Was the test performed?  No  Cognitive Function: 6CIT completed        07/30/2023   12:57 PM 03/04/2022    3:12 PM  6CIT Screen  What Year? 0 points 0 points  What month? 0 points 0 points  What time? 0 points 0 points  Count back from 20 0 points 0 points  Months in reverse 0 points 2 points  Repeat phrase 0 points 0 points  Total Score 0 points 2 points    Immunizations Immunization History  Administered Date(s) Administered   Fluad Quad(high Dose 65+) 12/19/2020, 12/02/2022   Influenza Inj Mdck Quad Pf 12/27/2018   Influenza, High Dose Seasonal PF 11/14/2019   Influenza,inj,Quad PF,6+ Mos 01/18/2015, 01/30/2016, 12/31/2017   Influenza-Unspecified 01/25/2013, 12/10/2016, 12/25/2021   PFIZER(Purple Top)SARS-COV-2 Vaccination 04/05/2019, 05/06/2019, 01/12/2020   PNEUMOCOCCAL CONJUGATE-20 05/10/2021   Pneumococcal Conjugate-13 01/17/2020   Pneumococcal Polysaccharide-23 08/13/2015   Respiratory Syncytial Virus Vaccine,Recomb Aduvanted(Arexvy) 12/14/2022   Tdap 08/13/2015   Zoster Recombinant(Shingrix ) 11/14/2019, 08/23/2020, 11/27/2020   Zoster, Live  09/17/2015    Screening Tests Health Maintenance  Topic Date Due   COVID-19 Vaccine (4 - 2024-25 season) 11/02/2022   Diabetic kidney evaluation - Urine ACR  08/18/2023   OPHTHALMOLOGY EXAM  09/08/2023   INFLUENZA VACCINE  10/02/2023   HEMOGLOBIN A1C  12/22/2023   FOOT EXAM  02/17/2024   Diabetic kidney evaluation - eGFR measurement  05/11/2024   MAMMOGRAM  06/23/2024   Medicare Annual Wellness (AWV)  07/29/2024   DTaP/Tdap/Td (2 - Td or Tdap) 08/12/2025   Colonoscopy  07/23/2027   DEXA SCAN  12/30/2027   Pneumonia Vaccine 45+ Years old  Completed   Hepatitis C Screening  Completed   Zoster Vaccines- Shingrix   Completed   HPV VACCINES  Aged Out   Meningococcal B Vaccine  Aged Out    Health Maintenance  Health Maintenance Due  Topic Date Due   COVID-19 Vaccine (4 - 2024-25 season) 11/02/2022   Diabetic kidney evaluation - Urine ACR  08/18/2023   Health Maintenance Items Addressed: UP TO DATE ON SHOTS EXCEPT COVID; UP TO DATE MAMMOGRAM & BDS & COLONOSCOPY  Additional Screening:  Vision Screening: Recommended annual ophthalmology exams for early detection of glaucoma and other disorders of the eye.  Dental Screening: Recommended annual dental exams for  proper oral hygiene  Community Resource Referral / Chronic Care Management: CRR required this visit?  No   CCM required this visit?  No   Plan:    I have personally reviewed and noted the following in the patient's chart:   Medical and social history Use of alcohol, tobacco or illicit drugs  Current medications and supplements including opioid prescriptions. Patient is not currently taking opioid prescriptions. Functional ability and status Nutritional status Physical activity Advanced directives List of other physicians Hospitalizations, surgeries, and ER visits in previous 12 months Vitals Screenings to include cognitive, depression, and falls Referrals and appointments  In addition, I have reviewed and  discussed with patient certain preventive protocols, quality metrics, and best practice recommendations. A written personalized care plan for preventive services as well as general preventive health recommendations were provided to patient.   Pinky Bright, LPN   11/15/7827   After Visit Summary: (MyChart) Due to this being a telephonic visit, the after visit summary with patients personalized plan was offered to patient via MyChart   Notes: Nothing significant to report at this time.

## 2023-08-02 NOTE — Anesthesia Postprocedure Evaluation (Signed)
 Anesthesia Post Note  Patient: Laurie Daniels  Procedure(s) Performed: LEFT BREAST PARTIAL MASTECTOMY  WITH RADIO FREQUENCY LOCALIZER (Left) BIOPSY, LYMPH NODE, SENTINEL, AXILLARY (Left: Breast)  Patient location during evaluation: PACU Anesthesia Type: General Level of consciousness: awake and alert Pain management: pain level controlled Vital Signs Assessment: post-procedure vital signs reviewed and stable Respiratory status: spontaneous breathing, nonlabored ventilation, respiratory function stable and patient connected to nasal cannula oxygen Cardiovascular status: blood pressure returned to baseline and stable Postop Assessment: no apparent nausea or vomiting Anesthetic complications: no   No notable events documented.   Last Vitals:  Vitals:   07/17/23 1215 07/17/23 1237  BP: (!) 193/84 (!) 181/95  Pulse: (!) 59 60  Resp: 11 14  Temp:  (!) 36.3 C  SpO2: 98% 98%    Last Pain:  Vitals:   07/17/23 1237  TempSrc: Tympanic  PainSc: 0-No pain                 Vanice Genre

## 2023-08-04 ENCOUNTER — Other Ambulatory Visit: Payer: Self-pay | Admitting: Family Medicine

## 2023-08-04 DIAGNOSIS — E559 Vitamin D deficiency, unspecified: Secondary | ICD-10-CM

## 2023-08-05 ENCOUNTER — Ambulatory Visit: Attending: Oncology | Admitting: Occupational Therapy

## 2023-08-05 ENCOUNTER — Encounter: Payer: Self-pay | Admitting: Oncology

## 2023-08-05 ENCOUNTER — Inpatient Hospital Stay: Admitting: Oncology

## 2023-08-05 ENCOUNTER — Ambulatory Visit
Admission: RE | Admit: 2023-08-05 | Discharge: 2023-08-05 | Disposition: A | Discharge status: Home / Self Care | Source: Ambulatory Visit | Attending: Radiation Oncology | Admitting: Radiation Oncology

## 2023-08-05 ENCOUNTER — Inpatient Hospital Stay (HOSPITAL_BASED_OUTPATIENT_CLINIC_OR_DEPARTMENT_OTHER): Attending: Oncology | Admitting: Hospice and Palliative Medicine

## 2023-08-05 ENCOUNTER — Encounter: Payer: Self-pay | Admitting: Occupational Therapy

## 2023-08-05 ENCOUNTER — Encounter: Payer: Self-pay | Admitting: *Deleted

## 2023-08-05 VITALS — BP 146/63 | HR 66 | Temp 96.1°F | Resp 14 | Ht 63.0 in | Wt 147.9 lb

## 2023-08-05 DIAGNOSIS — M8580 Other specified disorders of bone density and structure, unspecified site: Secondary | ICD-10-CM

## 2023-08-05 DIAGNOSIS — M25612 Stiffness of left shoulder, not elsewhere classified: Secondary | ICD-10-CM | POA: Insufficient documentation

## 2023-08-05 DIAGNOSIS — Z170 Estrogen receptor positive status [ER+]: Secondary | ICD-10-CM | POA: Diagnosis not present

## 2023-08-05 DIAGNOSIS — D0512 Intraductal carcinoma in situ of left breast: Secondary | ICD-10-CM | POA: Diagnosis not present

## 2023-08-05 DIAGNOSIS — Z510 Encounter for antineoplastic radiation therapy: Secondary | ICD-10-CM | POA: Diagnosis present

## 2023-08-05 DIAGNOSIS — N6092 Unspecified benign mammary dysplasia of left breast: Secondary | ICD-10-CM

## 2023-08-05 NOTE — Consult Note (Signed)
 NEW PATIENT EVALUATION  Name: Laurie Daniels  MRN: [id number]  Date:   08/05/2023     DOB: 06/27/1955   This 68 y.o. 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   PREVIOUS INVESTIGATIONS:  Mammogram and ultrasound reviewed Clinical notes reviewed Pathology reports reviewed  HPI: Patient is a 68 year old female who presented with an abnormal mammogram of her left breast.  There was a 5 mm group of amorphous calcifications in the upper outer quadrant with low suspicion for malignancy.  She underwent stereotactic biopsy which was positive for a 0.2 cm area of high-grade ductal carcinoma in situ focally micro vision cannot be entirely ruled out.  Tumor was ER positive strongly.She went on to have a wide local excision showing breast tissue with extensive inflammation informatory reaction.  No evidence of residual carcinoma was seen.  2 sentinel lymph nodes were negative for metastatic disease.  Patient has done well postoperatively specifically denies breast tenderness cough or bone pain.  She has been seen by medical oncology and will be treated with endocrine therapy after completion of radiation.  PLANNED TREATMENT REGIMEN: DIBH left breast  PAST MEDICAL HISTORY:  has a past medical history of Anxiety and depression, Atherosclerosis of aorta (HCC), CTS (carpal tunnel syndrome), Depressive disorder, Diabetic neuropathy (HCC), Ductal carcinoma in situ (DCIS) of left breast (07/2023), Hyperactivity of bladder, Hypertension, Insomnia, Lumbar sprain, Ovarian failure, Rhinitis, Type 2 diabetes mellitus without complication (HCC), and Vaginitis and vulvovaginitis.    PAST SURGICAL HISTORY:   Past Surgical History:  Procedure Laterality Date   AXILLARY SENTINEL NODE BIOPSY Left 07/17/2023   Procedure: BIOPSY, LYMPH NODE, SENTINEL, AXILLARY;  Surgeon: Eldred Grego, MD;  Location: [facility];  Service: General;  Laterality: Left;   BREAST BIOPSY Left 07/03/2023   Stereo bx, ribbon clip, path pending   BREAST BIOPSY Left 07/03/2023   MM LT BREAST BX W LOC DEV 1ST LESION IMAGE BX SPEC STEREO GUIDE 07/03/2023 [facility]   BREAST BIOPSY  07/14/2023   MM LT BREAST SAVI/RF TAG 1ST LESION MAMMO GUIDE 07/14/2023 [facility]   BREAST LUMPECTOMY WITH RADIO FREQUENCY LOCALIZER Left 07/17/2023   Procedure: LEFT BREAST PARTIAL MASTECTOMY  WITH RADIO FREQUENCY LOCALIZER;  Surgeon: Eldred Grego, MD;  Location: [facility];  Service: General;  Laterality: Left;   CATARACT EXTRACTION Right 08/31/2017   CESAREAN SECTION     COLONOSCOPY  06/17/2007   Dr Ole Berkeley   COLONOSCOPY WITH PROPOFOL  N/A 07/22/2017   Procedure: COLONOSCOPY WITH PROPOFOL ;  Surgeon: Marshall Skeeter, MD;  Location: [facility];  Service: Endoscopy;  Laterality: N/A;   TUBAL LIGATION     vitrectomy Right 07/10/2016   done at [facility]    FAMILY HISTORY: family history includes Diabetes in her mother; Hypertension in her mother; Lung cancer in her father.  SOCIAL HISTORY:  reports that she has never smoked. She has never used smokeless tobacco. She reports that she does not drink alcohol and does not use drugs.  ALLERGIES: Patient has no known allergies.  MEDICATIONS:  Current Outpatient Medications  Medication Sig Dispense Refill   aspirin 81 MG tablet Take 81 mg by mouth daily.     atorvastatin  (LIPITOR) 40 MG tablet  Take 1 tablet (40 mg total) by mouth daily. 90 tablet 1   Cholecalciferol (VITAMIN D3) 50 MCG (2000 UT) capsule Take 1 capsule (2,000 Units total) by mouth daily. 12 capsule 1   Continuous Glucose Sensor (DEXCOM G7 SENSOR) MISC 1 applicator by Does not apply route as needed. 2 each 5   JANUVIA   100 MG tablet Take 1 tablet (100 mg total) by mouth daily. 90 tablet 1   losartan  (COZAAR ) 50 MG tablet Take 1 tablet (50 mg total) by mouth daily. 90 tablet 1   mirtazapine  (REMERON ) 15 MG tablet TAKE 1 TABLET BY MOUTH EVERYDAY AT BEDTIME 90 tablet 1   omeprazole  (PRILOSEC) 20 MG capsule Take 1 capsule (20 mg total) by mouth daily. 90 capsule 1   sertraline  (ZOLOFT ) 50 MG tablet Take 1 tablet (50 mg total) by mouth daily. 90 tablet 1   vitamin B-12 (CYANOCOBALAMIN ) 100 MCG tablet Take 100 mcg by mouth daily.     No current facility-administered medications for this encounter.    ECOG PERFORMANCE STATUS:  0 - Asymptomatic  REVIEW OF SYSTEMS: Patient denies any weight loss, fatigue, weakness, fever, chills or night sweats. Patient denies any loss of vision, blurred vision. Patient denies any ringing  of the ears or hearing loss. No irregular heartbeat. Patient denies heart murmur or history of fainting. Patient denies any chest pain or pain radiating to her upper extremities. Patient denies any shortness of breath, difficulty breathing at night, cough or hemoptysis. Patient denies any swelling in the lower legs. Patient denies any nausea vomiting, vomiting of blood, or coffee ground material in the vomitus. Patient denies any stomach pain. Patient states has had normal bowel movements no significant constipation or diarrhea. Patient denies any dysuria, hematuria or significant nocturia. Patient denies any problems walking, swelling in the joints or loss of balance. Patient denies any skin changes, loss of hair or loss of weight. Patient denies any excessive worrying or anxiety or significant depression. Patient denies any problems with insomnia. Patient denies excessive thirst, polyuria, polydipsia. Patient denies any swollen glands, patient denies easy bruising or easy bleeding. Patient denies any recent infections, allergies or URI. Patient "s visual fields have not changed significantly in recent  time.   PHYSICAL EXAM: There were no vitals taken for this visit. Patient status post wide local excision of the left breast.  Incision is well-healed.  Does have some fluid accumulating in her left axilla.  That incision is also well-healed.  No other dominant masses noted in either breast no axillary or supraclavicular adenopathy is appreciated.  Well-developed well-nourished patient in NAD. HEENT reveals PERLA, EOMI, discs not visualized.  Oral cavity is clear. No oral mucosal lesions are identified. Neck is clear without evidence of cervical or supraclavicular adenopathy. Lungs are clear to A&P. Cardiac examination is essentially unremarkable with regular rate and rhythm without murmur rub or thrill. Abdomen is benign with no organomegaly or masses noted. Motor sensory and DTR levels are equal and symmetric in the upper and lower extremities. Cranial nerves II through XII are grossly intact. Proprioception is intact. No peripheral adenopathy or edema is identified. No motor or sensory levels are noted. Crude visual fields are within normal range.  LABORATORY DATA: Pathology reports reviewed    RADIOLOGY RESULTS: Mammogram reviewed compatible with above-stated findings   IMPRESSION: High-grade ductal carcinoma in situ of the left breast status post wide local excision with no residual DCIS found stage 0 ER positive DCIS in 68 year old female  PLAN: At  this time I have recommended whole breast course of radiation therapy to her left breast.  We treat her whole breast over 3 weeks boosting her scar another 1000 cGy using photon being.  Risks and benefits of treatment including skin reaction fatigue alteration blood counts possible inclusion of superficial lung all were discussed in detail with the patient.  Will try to have her do deep inspiration breath-hold during treatments to spare further her cardiac tissue.  Patient also will be candidate for endocrine therapy after completion of radiation.   Patient comprehends my recommendations well.  I personally set up and ordered CT simulation for early next week.  I would like to take this opportunity to thank you for allowing me to participate in the care of your patient.Glenis Langdon, MD

## 2023-08-05 NOTE — Progress Notes (Signed)
 Multidisciplinary Oncology Council Documentation  Laurie Daniels was presented by our [facility] on 08/05/2023, which included representatives from:  Palliative Care Dietitian  Physical/Occupational Therapist Nurse Navigator Genetics Social work Survivorship RN Financial Navigator Research RN   [name] currently presents with history of breast cancer  We reviewed previous medical and familial history, history of present illness, and recent lab results along with all available histopathologic and imaging studies. The MOC considered available treatment options and made the following recommendations/referrals:  Rehab screening, SW  The MOC is a meeting of clinicians from various specialty areas who evaluate and discuss patients for whom a multidisciplinary approach is being considered. Final determinations in the plan of care are those of the provider(s).   Today's extended care, comprehensive team conference, [name] was not present for the discussion and was not examined.

## 2023-08-05 NOTE — Assessment & Plan Note (Signed)
 Oct 2024 DEXA - osteopenia.  Recommend calcium  1200mg  and Vitamin D  supplementation.

## 2023-08-05 NOTE — Therapy (Signed)
 OUTPATIENT OCCUPATIONAL THERAPY BREAST CANCER POSTOP EVALUATION   Patient Name: Laurie Daniels MRN: [id number] DOB:1955/10/09, 68 y.o., female Today's Date: 08/05/2023  END OF SESSION:  OT End of Session - 08/05/23 1852     Visit Number 1    Number of Visits 4    Date for OT Re-Evaluation 09/30/23    OT Start Time 0937    OT Stop Time 1002    OT Time Calculation (min) 25 min    Activity Tolerance Patient tolerated treatment well    Behavior During Therapy WFL for tasks assessed/performed             Past Medical History:  Diagnosis Date   Anxiety and depression    Atherosclerosis of aorta (HCC)    CTS (carpal tunnel syndrome)    Depressive disorder    Diabetic neuropathy (HCC)    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   Hyperactivity of bladder    Hypertension    Insomnia    Lumbar sprain    Ovarian failure    Rhinitis    Type 2 diabetes mellitus without complication (HCC)    Vaginitis and vulvovaginitis    Past Surgical History:  Procedure Laterality Date   AXILLARY SENTINEL NODE BIOPSY Left 07/17/2023   Procedure: BIOPSY, LYMPH NODE, SENTINEL, AXILLARY;  Surgeon: Eldred Grego, MD;  Location: [facility];  Service: General;  Laterality: Left;   BREAST BIOPSY Left 07/03/2023   Stereo bx, ribbon clip, path pending   BREAST BIOPSY Left 07/03/2023   MM LT BREAST BX W LOC DEV 1ST LESION IMAGE BX SPEC STEREO GUIDE 07/03/2023 [facility]   BREAST BIOPSY  07/14/2023   MM LT BREAST SAVI/RF TAG 1ST LESION MAMMO GUIDE 07/14/2023 [facility]   BREAST LUMPECTOMY WITH RADIO FREQUENCY LOCALIZER Left 07/17/2023   Procedure: LEFT BREAST PARTIAL MASTECTOMY  WITH RADIO FREQUENCY LOCALIZER;  Surgeon: Eldred Grego, MD;  Location: [facility];  Service: General;  Laterality: Left;   CATARACT EXTRACTION Right 08/31/2017   CESAREAN SECTION     COLONOSCOPY  06/17/2007   Dr Ole Berkeley   COLONOSCOPY WITH PROPOFOL  N/A 07/22/2017   Procedure: COLONOSCOPY WITH  PROPOFOL ;  Surgeon: Marshall Skeeter, MD;  Location: [facility];  Service: Endoscopy;  Laterality: N/A;   TUBAL LIGATION     vitrectomy Right 07/10/2016   done at [facility]   Patient Active Problem List   Diagnosis Date Noted   Atypical ductal hyperplasia of left breast 08/05/2023   Osteopenia 08/05/2023   Ductal carcinoma in situ (DCIS) of left breast 07/09/2023   Osteoarthritis of right knee 10/23/2021   Pain in joint of right knee 10/23/2021   Hyperparathyroidism (HCC) 05/10/2021   Dyslipidemia associated with type 2 diabetes mellitus (HCC) 05/10/2021   Chronic constipation 01/30/2016   Allergic rhinitis 08/13/2015   Carpal tunnel syndrome 08/13/2015   Atherosclerosis of aorta (HCC) 08/13/2015   Nocturia 08/13/2015   Migraine without aura and responsive to treatment 08/13/2015   Thyroid  goiter 08/13/2015   Diabetes mellitus with microalbuminuria (HCC) 03/05/2015   Insomnia 10/12/2014   Hyperlipidemia 10/12/2014    PCP: Dr Ava Lei  REFERRING PROVIDER: Dr Arden Kotyk DIAG: L breast cancer s/p lumpectomy  THERAPY DIAG:  Stiffness of left shoulder, not elsewhere classified  Rationale for Evaluation and Treatment: Rehabilitation  ONSET DATE: 07/17/23  SUBJECTIVE:  SUBJECTIVE STATEMENT: Patient reports she is here today after being refer by one of her medical team for her newly diagnosed left breast cancer.   PERTINENT HISTORY:  Patient was diagnosed with left  breast cancer -had left lumpectomy.  No chemo but plan to have radiation Dr Charmel Cooter note 07/30/23: BREAST: Right side seroma of the left partial mastectomy and axillary sentinel node biopsy sites. Currently without erythema or sign of infection.  Assessment & Plan Left ductal carcinoma in situ (DCIS)  Post-operative evaluation after  left partial mastectomy with sentinel lymph node biopsy on Jul 17, 2023, shows no residual carcinoma and one lymph node negative for metastatic carcinoma. Focal atypical hyperplasia is noted. Follow up with medical oncology next week to discuss adjuvant therapies, including radiation therapy.  Seroma of the left breast and axillary area  An uncomplicated seroma is present in the left breast and axillary area, without signs of infection. Observe the seroma and follow up in two weeks. Consider aspiration if the seroma increases in size or causes symptoms.  Ductal carcinoma in situ (DCIS) of left breast [D05.12]   PATIENT GOALS:   reduce lymphedema risk and learn post op HEP.   PAIN:  Slight pull in discomfort with overhead left shoulder flexion abduction on left breast  PRECAUTIONS: Left upper extremity lymphedema   HAND DOMINANCE: Right  WEIGHT BEARING RESTRICTIONS: No  FALLS:  Has patient fallen in last 6 months? 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 [employer]  LEISURE: Likes to watch TV and does her own housework   OBJECTIVE:  COGNITION: Overall cognitive status: Within functional limits for tasks assessed    POSTURE:  Forward head and rounded shoulders posture  UPPER EXTREMITY AROM/PROM:  A/PROM RIGHT   eval   Shoulder extension   Shoulder flexion WFL  Shoulder abduction WFL  Shoulder internal rotation [facility]  Shoulder external rotation WFL    (Blank rows = not tested)  A/PROM LEFT   eval  Shoulder extension   Shoulder flexion 125  Shoulder abduction 100  Shoulder internal rotation   Shoulder external rotation 75    (Blank rows = not tested)  CERVICAL AROM: All within normal limits:    UPPER EXTREMITY STRENGTH: Not tested patient is 3 weeks postop  LYMPHEDEMA ASSESSMENTS:   LYMPHEDEMA/ONCOLOGY QUESTIONNAIRE - 08/05/23 0001       Right Upper Extremity Lymphedema   15 cm Proximal to Olecranon  Process 32.2 cm    10 cm Proximal to Olecranon Process 29.5 cm    Olecranon Process 24.5 cm      Left Upper Extremity Lymphedema   15 cm Proximal to Olecranon Process 32 cm    10 cm Proximal to Olecranon Process 29.7 cm    Olecranon Process 24.6 cm              L-DEX LYMPHEDEMA SCREENING:  The patient was assessed using the L-Dex machine today to produce a lymphedema index baseline score. The patient will be reassessed on a regular basis (typically every 3 months) to obtain new L-Dex scores. If the score is > 6.5 points away from his/her baseline score indicating onset of subclinical lymphedema, it will be recommended to wear a compression garment for 4 weeks, 12 hours per day and then be reassessed. If the score continues to be > 6.5 points from baseline at reassessment, we will initiate lymphedema treatment. Assessing in this manner has a 95% rate of preventing clinically significant lymphedema.  L-DEX FLOWSHEETS - 08/05/23 1800       L-DEX LYMPHEDEMA SCREENING   Measurement Type Unilateral    L-DEX MEASUREMENT EXTREMITY Upper Extremity    POSITION  Standing    DOMINANT SIDE Right    At Risk Side Left    BASELINE SCORE (UNILATERAL) 1.5              PATIENT EDUCATION:  Education details: Lymphedema risk reduction to be done next week and post op shoulder/posture HEP done today Person educated: Patient Education method: Explanation, Demonstration, Handout Education comprehension: Patient verbalized understanding and returned demonstration  HOME EXERCISE PROGRAM: Patient was instructed today in a home exercise programfor post op shoulder range of motion. These included active assist shoulder flexion and abduction in supine as well as external rotation using gravity to assist.  3 times a day 15 reps keeping pain-free under 1-2/10.  In standing 4-5 times a day can do scapular retraction She was encouraged to do these 2-3 x day, holding 3 seconds and repeating 10 times when  permitted by her physician/surgeon.   ASSESSMENT:  CLINICAL IMPRESSION: Her multidisciplinary medical team has met to assess and determine a recommended treatment plan. She had a left lumpectomy with 1 sentinel lymph node removed on 07/17/2023 by Dr. Charmel Cooter.  Patient met with oncology today as well as radiation MD.  No chemo indicated but radiation.  Patient present with decreased endrange shoulder active range of motion.  Patient has a follow-up appointment with surgeon later today for possible aspiration of seroma on the left breast.  She will benefit from a post op OT reassessment to determine needs and from L-Dex screens every 3 months for 2 years to detect subclinical lymphedema.  Pt will benefit from skilled therapeutic intervention to improve on the following deficits: Decreased knowledge of precautions and lymphedema education, impaired UE functional use, pain, decreased ROM, postural dysfunction.   OT treatment/interventions: ADL/self-care home management, pt/family education, therapeutic exercise,manual therapy  REHAB POTENTIAL: Good  CLINICAL DECISION MAKING: Stable/uncomplicated  EVALUATION COMPLEXITY: Low   GOALS: Goals reviewed with patient? YES  LONG TERM GOALS: (STG=LTG)    Name Target Date Goal status  1 Pt will be able to verbalize understanding of pertinent lymphedema risk reduction practices relevant to her dx specifically related to skin care.  Baseline:  No knowledge 4 weeks Initial  2 Pt will be able to return demo and/or verbalize understanding of the post op HEP related to regaining shoulder ROM. Baseline:  No knowledge Today Achieved at eval       4 Pt will demo she has regained full shoulder ROM and function post operatively compared to baselines.  Baseline: See objective measurements taken today. 8 weeks Initial    PLAN:  OT FREQUENCY/DURATION: EVAL and 2-3 follow up appointment.    Occupational Therapy Information for After Breast Cancer  Surgery/Treatment:  Lymphedema is a swelling condition that you may be at risk for in your arm if you have lymph nodes removed from the armpit area.  After a sentinel node biopsy, the risk is approximately 5-9% and is higher after an axillary node dissection.  There is treatment available for this condition and it is not life-threatening.  Contact your physician or occupational therapist with concerns. You may begin the 4 shoulder/posture exercises (see additional sheet) when permitted by your physician (typically a week after surgery).  If you have drains, you may need to wait until those are removed before beginning range of motion exercises.  A general  recommendation is to not lift your arms above shoulder height until drains are removed.  These exercises should be done to your tolerance and gently.  This is not a "no pain/no gain" type of recovery so listen to your body and stretch into the range of motion that you can tolerate, stopping if you have pain.  If you are having immediate reconstruction, ask your plastic surgeon about doing exercises as he or she may want you to wait. .  While undergoing any medical procedure or treatment, try to avoid blood pressure being taken or needle sticks from occurring on the arm on the side of cancer.   This recommendation begins after surgery and continues for the rest of your life.  This may help reduce your risk of getting lymphedema (swelling in your arm). An excellent resource for those seeking information on lymphedema is the National Lymphedema Network's web site. It can be accessed at www.lymphnet.org If you notice swelling in your hand, arm or breast at any time following surgery (even if it is many years from now), please contact your doctor or occupational therapist to discuss this.  Lymphedema can be treated at any time but it is easier for you if it is treated early on.  If you feel like your shoulder motion is not returning to normal in a reasonable  amount of time, please contact your surgeon or occupational therapist.  [facility] [phone]. [address] [place], [place] [address]  Patient was instructed today in a home exercise program today for post op shoulder range of motion. These included active assist shoulder flexion in standing/supine, scapular retraction, wall walking/slides with shoulder abduction, and hands behind head external rotation in supine.  She was encouraged to do these 2-3 x day, holding 3 seconds and repeating 10 times when permitted by her physician/surgeon      Heloise Lobo, OTR/L,CLT 08/05/2023, 6:57 PM

## 2023-08-05 NOTE — Progress Notes (Signed)
 Patient denies new or acute problems/concerns today.

## 2023-08-05 NOTE — Progress Notes (Signed)
 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  ER 100%+  Pathology results were reviewed with patient.  Recommend adjuvant Radiation.  After that recommend adjuvant endocrine therapy with aromatase inhibitor.     Atypical ductal hyperplasia of left breast S/p excision.   Osteopenia Oct 2024 DEXA - osteopenia.  Recommend calcium  1200mg  and Vitamin D  supplementation.    No orders of the defined types were placed in this encounter.  Follow up a few weeks after radiation.   All questions were answered. The patient knows to call the clinic with any problems, questions or concerns.  Timmy Forbes, MD, PhD [facility] Hematology Oncology 08/05/2023    HISTORY OF PRESENTING ILLNESS:  Laurie Daniels 68 y.o. female presents to establish care for left breast DCIS  I have reviewed her chart and materials related to her cancer extensively and collaborated history with the patient. Summary of oncologic history is as follows: Oncology History  Ductal carcinoma in situ (DCIS) of left breast  06/29/2023 Mammogram   Screening mammogram  In the left breast, calcifications warrant further evaluation. In the right breast, no findings suspicious for malignancy.   07/01/2023 Mammogram   Bilateral diagnostic mammogram  LEFT breast 5 mm group of amorphous calcification in the upper-outer quadrant is low suspicion for malignancy. Recommend further assessment with stereotactic guided biopsy.   07/09/2023 Initial Diagnosis   Ductal carcinoma in situ (DCIS) of left breast  Patient underwent left breast biopsy.  Pathology showed 1. Breast, left, needle core biopsy, upper outer quadrant (ribbon clip) :   - DUCTAL CARCINOMA IN SITU, HIGH GRADE    - FOCAL MICROINVASION CANNOT BE ENTIRELY RULED OUT   - NECROSIS: PRESENT   - CALCIFICATIONS: PRESENT   - DCIS LENGTH: 0.2 CM     First live birth at age of 29 OCP use: yes History of hysterectomy: no Menopausal status: post History of HRT use: no History of chest radiation: no Number of previous breast biopsies:  no   07/09/2023 Cancer Staging   Staging form: Breast, AJCC 8th Edition - Clinical stage from 07/09/2023: Stage 0 (cTis (DCIS), cN0, cM0, G3) - Signed by Timmy Forbes, MD on 07/09/2023 Stage prefix: Initial diagnosis Histologic grading system: 3 grade system   07/17/2023 Surgery      1. Breast, lumpectomy, left breast mass :       BREAST TISSUE WITH EXTENSIVE INFLAMMATION AND FOREIGN MATERIAL REACTION       CONSISTENT WITH BIOPSY SITE CHANGES.       FOCAL ATYPICAL DUCTAL HYPERPLASIA (0.2 MM).       BIOPSY CLIP (RIBBON SHAPED) IDENTIFIED.       NO EVIDENCE OF RESIDUAL CARCINOMA.       SEE ONCOLOGY TABLE.   2. Lymph node, sentinel, biopsy, left axillary #1 :      ONE LYMPH NODE, NEGATIVE FOR METASTATIC CARCINOMA (0/1).       3. Breast, excision, left breast reexcision, posterior margin stitch inside cavity :      BENIGN BREAST TISSUE.      NEGATIVE FOR ATYPIA OR MALIGNANCY.       4. Breast, excision, left breast anterior margin stitch inside cavity, anterior margin stitch inside cavity :      BENIGN BREAST TISSUE WITH  APOCRINE CYSTS.      NEGATIVE FOR ATYPIA OR MALIGNANCY.       5. Lymph node, sentinel, biopsy, left axillary #2 :      ONE LYMPH NODE, NEGATIVE FOR METASTATIC CARCINOMA (0/1).  ONCOLOGY TABLE      DCIS OF THE BREAST:  Resection ([id number])      Biopsy ([id number])      Procedure: Lumpectomy      Specimen Laterality: Left      Histologic Type: No residual carcinoma in the resection, Ductal carcinoma in      situ in the biopsy      Size of DCIS: 0.2 cm ([id number])      Nuclear Grade: High - grade (biopsy)      Necrosis: Present (biopsy)       Margins: All margins negative for DCIS      Specify Closest Margin (required only if <45mm): NA      Regional Lymph Nodes: Not applicable (no lymph nodes submitted or found)      Number of Lymph Nodes Examined: 0      Number of Sentinel Nodes Examined (if applicable): 0      Number of Lymph Nodes with Macrometastases: NA      Number of Lymph Nodes with Micrometastases): NA      Number of Lymph Nodes with Isolated Tumor Cells (=0.2 mm or =200 cells): NA      Extranodal Extension: NA      Breast Biomarker Testing Performed on Previous Biopsy:      Testing performed on Case Number: [id number]      Estrogen Receptor: 100%, positive, strong staining intensity      Progesterone Receptor: 50%, positive, weak-moderate staining intensity      Pathologic Stage Classification (pTNM, AJCC 8th Edition): pTis, pNx      She presents to discuss pathology and adjuvant recommendation. She has no concerns of her lumpectomy surgery site. Mild soreness.    MEDICAL HISTORY:  Past Medical History:  Diagnosis Date   Anxiety and depression    Atherosclerosis of aorta (HCC)    CTS (carpal tunnel syndrome)    Depressive disorder    Diabetic neuropathy (HCC)    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   Hyperactivity of bladder    Hypertension    Insomnia    Lumbar sprain    Ovarian failure    Rhinitis    Type 2 diabetes mellitus without complication (HCC)    Vaginitis and vulvovaginitis     SURGICAL HISTORY: Past Surgical History:  Procedure Laterality Date   AXILLARY SENTINEL NODE BIOPSY Left 07/17/2023   Procedure: BIOPSY, LYMPH NODE, SENTINEL, AXILLARY;  Surgeon: Eldred Grego, MD;  Location: [facility] ORS;  Service: General;  Laterality: Left;   BREAST BIOPSY Left 07/03/2023   Stereo bx, ribbon clip, path pending   BREAST BIOPSY Left 07/03/2023   MM LT BREAST BX W LOC DEV 1ST LESION IMAGE BX SPEC STEREO GUIDE 07/03/2023 [facility]   BREAST BIOPSY  07/14/2023   MM LT  BREAST SAVI/RF TAG 1ST LESION MAMMO GUIDE 07/14/2023 [facility]   BREAST LUMPECTOMY WITH RADIO FREQUENCY LOCALIZER Left 07/17/2023   Procedure: LEFT BREAST PARTIAL MASTECTOMY  WITH RADIO FREQUENCY LOCALIZER;  Surgeon: Eldred Grego, MD;  Location: [facility] OR[facility];  Service: General;  Laterality: Left;   CATARACT EXTRACTION Right 08/31/2017   CESAREAN SECTION     COLONOSCOPY  06/17/2007   Dr Ole Berkeley   COLONOSCOPY WITH PROPOFOL  N/A 07/22/2017   Procedure:  COLONOSCOPY WITH PROPOFOL ;  Surgeon: Marshall Skeeter, MD;  [facility];  Service: Endoscopy;  Laterality: N/A;   TUBAL LIGATION     vitrectomy Right 07/10/2016   done at [facility]    SOCIAL HISTORY: Social History   Socioeconomic History   Marital status: Divorced    Spouse name: Not on file   Number of children: 2   Years of education: Not on file   Highest education level: 12th grade  Occupational History   Occupation: housekeeping   Tobacco Use   Smoking status: Never   Smokeless tobacco: Never  Vaping Use   Vaping status: Never Used  Substance and Sexual Activity   Alcohol use: No    Alcohol/week: 0.0 standard drinks of alcohol   Drug use: No   Sexual activity: Yes    Partners: Male  Other Topics Concern   Not on file  Social History Narrative   Divorced since 2005, she has a boyfriend but they don't live together      Pt's son and granddaughter lives with her   Social Drivers of Corporate investment banker Strain: Low Risk  (07/30/2023)   Overall Financial Resource Strain (CARDIA)    Difficulty of Paying Living Expenses: Not hard at all  Recent Concern: Financial Resource Strain - High Risk (07/09/2023)   Received from [facility]   Overall Financial Resource Strain (CARDIA)    Difficulty of Paying Living Expenses: Hard  Food Insecurity: No Food Insecurity (07/30/2023)   Hunger Vital Sign    Worried About Running Out of Food in the Last Year: Never true    Ran Out of Food in the  Last Year: Never true  Recent Concern: Food Insecurity - Food Insecurity Present (07/09/2023)   Received from [facility]   Hunger Vital Sign    Worried About Running Out of Food in the Last Year: Sometimes true    Ran Out of Food in the Last Year: Never true  Transportation Needs: No Transportation Needs (07/30/2023)   PRAPARE - Administrator, Civil Service (Medical): No    Lack of Transportation (Non-Medical): No  Physical Activity: Insufficiently Active (07/30/2023)   Exercise Vital Sign    Days of Exercise per Week: 3 days    Minutes of Exercise per Session: 30 min  Stress: No Stress Concern Present (07/30/2023)   [employer] of Occupational Health - Occupational Stress Questionnaire    Feeling of Stress : Not at all  Social Connections: Moderately Isolated (07/30/2023)   Social Connection and Isolation Panel [NHANES]    Frequency of Communication with Friends and Family: More than three times a week    Frequency of Social Gatherings with Friends and Family: More than three times a week    Attends Religious Services: More than 4 times per year    Active Member of [employer] or Organizations: No    Attends Banker Meetings: Never    Marital Status: Divorced  Catering manager Violence: Not At Risk (07/30/2023)   Humiliation, Afraid, Rape, and Kick questionnaire    Fear of Current or Ex-Partner: No    Emotionally Abused: No    Physically Abused: No    Sexually Abused: No    FAMILY HISTORY: Family History  Problem Relation Age of Onset   Hypertension Mother    Diabetes Mother    Lung cancer Father    Colon cancer Neg Hx     ALLERGIES:  has no  known allergies.  MEDICATIONS:  Current Outpatient Medications  Medication Sig Dispense Refill   aspirin 81 MG tablet Take 81 mg by mouth daily.     atorvastatin  (LIPITOR) 40 MG tablet Take 1 tablet (40 mg total) by mouth daily. 90 tablet 1   Cholecalciferol (VITAMIN D3) 50 MCG (2000 UT)  capsule Take 1 capsule (2,000 Units total) by mouth daily. 12 capsule 1   Continuous Glucose Sensor (DEXCOM G7 SENSOR) MISC 1 applicator by Does not apply route as needed. 2 each 5   JANUVIA  100 MG tablet Take 1 tablet (100 mg total) by mouth daily. 90 tablet 1   losartan  (COZAAR ) 50 MG tablet Take 1 tablet (50 mg total) by mouth daily. 90 tablet 1   mirtazapine  (REMERON ) 15 MG tablet TAKE 1 TABLET BY MOUTH EVERYDAY AT BEDTIME 90 tablet 1   omeprazole  (PRILOSEC) 20 MG capsule Take 1 capsule (20 mg total) by mouth daily. 90 capsule 1   sertraline  (ZOLOFT ) 50 MG tablet Take 1 tablet (50 mg total) by mouth daily. 90 tablet 1   vitamin B-12 (CYANOCOBALAMIN ) 100 MCG tablet Take 100 mcg by mouth daily.     No current facility-administered medications for this visit.    Review of Systems  Constitutional:  Negative for appetite change, chills, fatigue and fever.  HENT:   Negative for hearing loss and voice change.   Eyes:  Negative for eye problems.  Respiratory:  Negative for chest tightness and cough.   Cardiovascular:  Negative for chest pain.  Gastrointestinal:  Negative for abdominal distention, abdominal pain and blood in stool.  Endocrine: Negative for hot flashes.  Genitourinary:  Negative for difficulty urinating and frequency.   Musculoskeletal:  Negative for arthralgias.  Skin:  Negative for itching and rash.  Neurological:  Negative for extremity weakness.  Hematological:  Negative for adenopathy.  Psychiatric/Behavioral:  Negative for confusion.      PHYSICAL EXAMINATION: ECOG PERFORMANCE STATUS: 0 - Asymptomatic  Vitals:   08/05/23 0902  BP: (!) 146/63  Pulse: 66  Resp: 14  Temp: (!) 96.1 F (35.6 C)   Filed Weights   08/05/23 0902  Weight: 147 lb 14.4 oz (67.1 kg)    Physical Exam Constitutional:      General: She is not in acute distress.    Appearance: She is not diaphoretic.  HENT:     Head: Normocephalic and atraumatic.     Nose: Nose normal.      Mouth/Throat:     Pharynx: No oropharyngeal exudate.  Eyes:     General: No scleral icterus.    Pupils: Pupils are equal, round, and reactive to light.  Cardiovascular:     Rate and Rhythm: Normal rate and regular rhythm.     Heart sounds: No murmur heard. Pulmonary:     Effort: Pulmonary effort is normal. No respiratory distress.     Breath sounds: No rales.  Chest:     Chest wall: No tenderness.  Abdominal:     General: There is no distension.     Palpations: Abdomen is soft.     Tenderness: There is no abdominal tenderness.  Musculoskeletal:        General: Normal range of motion.     Cervical back: Normal range of motion and neck supple.  Skin:    General: Skin is warm and dry.     Findings: No erythema.  Neurological:     Mental Status: She is alert and oriented to person, place, and time.  Cranial Nerves: No cranial nerve deficit.     Motor: No abnormal muscle tone.     Coordination: Coordination normal.  Psychiatric:        Mood and Affect: Affect normal.     LABORATORY DATA:  I have reviewed the data as listed    Latest Ref Rng & Units 05/12/2023    3:33 PM 02/11/2022    3:59 PM 01/17/2020   12:02 PM  CBC  WBC 3.8 - 10.8 Thousand/uL 8.4  10.0  8.8   Hemoglobin 11.7 - 15.5 g/dL 40.9  81.1  91.4   Hematocrit 35.0 - 45.0 % 38.0  41.0  40.7   Platelets 140 - 400 Thousand/uL 239  211  255       Latest Ref Rng & Units 05/12/2023    3:33 PM 02/11/2022    3:59 PM 05/10/2021    4:25 PM  CMP  Glucose 65 - 99 mg/dL 782  956    BUN 7 - 25 mg/dL 17  19    Creatinine 2.13 - 1.05 mg/dL 0.86  5.78    Sodium 469 - 146 mmol/L 141  142    Potassium 3.5 - 5.3 mmol/L 4.3  3.9    Chloride 98 - 110 mmol/L 103  105    CO2 20 - 32 mmol/L 32  27    Calcium  8.6 - 10.4 mg/dL 62.9  52.8    41.3  9.9   Total Protein 6.1 - 8.1 g/dL 7.4  7.4    Total Bilirubin 0.2 - 1.2 mg/dL 0.4  0.3    AST 10 - 35 U/L 14  13    ALT 6 - 29 U/L 14  14       RADIOGRAPHIC STUDIES: I have  personally reviewed the radiological images as listed and agreed with the findings in the report. MM Breast Surgical Specimen Result Date: 07/17/2023 CLINICAL DATA:  Status post [facility] localized LEFT breast lumpectomy. Status post stereotactic guided biopsy of the LEFT breast demonstrate DCIS (RIBBON clip) EXAM: SPECIMEN RADIOGRAPH OF THE LEFT BREAST COMPARISON:  Previous exam(s). FINDINGS: Status post excision of the LEFT breast. The [facility] reflector and RIBBON shaped clip are present within the specimen. IMPRESSION: Specimen radiograph of the LEFT breast. Electronically Signed   By: Clancy Crimes M.D.   On: 07/17/2023 10:33   NM Sentinel Node Inj-No Rpt (Breast) Result Date: 07/17/2023 Lymphoseek was injected by the Nuclear Medicine Technologist for sentinel lymph node localization.   MM LT BREAST SAVI/RF TAG 1ST LESION MAMMO GUIDE Result Date: 07/14/2023 CLINICAL DATA:  Patient is status post stereotactic guided biopsy LEFT breast demonstrating DCIS (RIBBON clip). EXAM: NEEDLE LOCALIZATION OF THE LEFT BREAST WITH MAMMO GUIDANCE COMPARISON:  Previous exam(s). FINDINGS: Patient presents for needle localization prior to lumpectomy. I met with the patient and we discussed the procedure of needle localization including benefits and alternatives. We discussed the high likelihood of a successful procedure. We discussed the risks of the procedure, including infection, bleeding, tissue injury, and further surgery. Informed, written consent was given. The usual time-out protocol was performed immediately prior to the procedure. Using mammographic guidance, sterile technique, 1% lidocaine  and a 10 cm SAVI SCOUT needle, the RIBBON clip was localized using a lateral approach. The images were marked for Dr. Charmel Cooter. IMPRESSION: Radar reflector localization of the LEFT breast. No apparent complications. Electronically Signed   By: Clancy Crimes M.D.   On: 07/14/2023 16:08

## 2023-08-05 NOTE — Assessment & Plan Note (Signed)
S/p excision

## 2023-08-05 NOTE — Assessment & Plan Note (Signed)
 Left breast high grade DCIS, s/p lumpectomy + SLNB.  ER 100%+  Pathology results were reviewed with patient.  Recommend adjuvant Radiation.  After that recommend adjuvant endocrine therapy with aromatase inhibitor.

## 2023-08-07 ENCOUNTER — Other Ambulatory Visit: Payer: Self-pay | Admitting: Family Medicine

## 2023-08-07 DIAGNOSIS — F5101 Primary insomnia: Secondary | ICD-10-CM

## 2023-08-07 NOTE — Addendum Note (Signed)
 Addended by: Gerilyn Kobus R on: 08/07/2023 11:40 AM   Modules accepted: Level of Service

## 2023-08-10 ENCOUNTER — Ambulatory Visit
Admission: RE | Admit: 2023-08-10 | Discharge: 2023-08-10 | Disposition: A | Discharge status: Home / Self Care | Source: Ambulatory Visit | Attending: Radiation Oncology | Admitting: Radiation Oncology

## 2023-08-10 ENCOUNTER — Telehealth: Payer: Self-pay

## 2023-08-10 DIAGNOSIS — Z510 Encounter for antineoplastic radiation therapy: Secondary | ICD-10-CM | POA: Diagnosis not present

## 2023-08-10 NOTE — Telephone Encounter (Signed)
 Clinical Social Work was referred by [facility] for assessment of psychosocial needs.  CSW attempted to contact patient by phone.  Left voicemail with contact information and request for return call.

## 2023-08-11 ENCOUNTER — Encounter: Payer: Self-pay | Admitting: *Deleted

## 2023-08-11 DIAGNOSIS — Z510 Encounter for antineoplastic radiation therapy: Secondary | ICD-10-CM | POA: Diagnosis not present

## 2023-08-12 ENCOUNTER — Inpatient Hospital Stay: Admitting: Licensed Clinical Social Worker

## 2023-08-12 ENCOUNTER — Inpatient Hospital Stay: Admitting: Occupational Therapy

## 2023-08-12 ENCOUNTER — Telehealth: Payer: Self-pay

## 2023-08-12 DIAGNOSIS — M25612 Stiffness of left shoulder, not elsewhere classified: Secondary | ICD-10-CM

## 2023-08-12 DIAGNOSIS — Z510 Encounter for antineoplastic radiation therapy: Secondary | ICD-10-CM | POA: Diagnosis not present

## 2023-08-12 NOTE — Progress Notes (Signed)
 [facility] Clinical Social Work  Clinical Social Work was referred by [facility] for assessment of psychosocial needs.  Clinical Social Worker contacted patient by phone to offer support and assess for needs.    Informed her of CSW role.  Discussed the [facility], which patient is not eligible for at this time.  She expressed no other needs.     Kennth Peal, LCSW  Clinical Social Worker [facility]

## 2023-08-12 NOTE — Telephone Encounter (Signed)
 Patient scheduled for last XRT on 7/16. Please schedule MD approx 2-3 weeks after that date and notify pt of appt details.

## 2023-08-12 NOTE — Therapy (Signed)
 OUTPATIENT OCCUPATIONAL THERAPY BREAST CANCER POSTOP  TREATMENT   Patient Name: Laurie Daniels MRN: [id number] DOB:03-05-1955, 68 y.o., female Today's Date: 08/12/2023  END OF SESSION:  OT End of Session - 08/12/23 1703     Visit Number 2    Number of Visits 4    Date for OT Re-Evaluation 09/30/23    OT Start Time 1323    OT Stop Time 1402    OT Time Calculation (min) 39 min    Activity Tolerance Patient tolerated treatment well    Behavior During Therapy WFL for tasks assessed/performed             Past Medical History:  Diagnosis Date   Anxiety and depression    Atherosclerosis of aorta (HCC)    CTS (carpal tunnel syndrome)    Depressive disorder    Diabetic neuropathy (HCC)    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   Hyperactivity of bladder    Hypertension    Insomnia    Lumbar sprain    Ovarian failure    Rhinitis    Type 2 diabetes mellitus without complication (HCC)    Vaginitis and vulvovaginitis    Past Surgical History:  Procedure Laterality Date   AXILLARY SENTINEL NODE BIOPSY Left 07/17/2023   Procedure: BIOPSY, LYMPH NODE, SENTINEL, AXILLARY;  Surgeon: Eldred Grego, MD;  Location: [facility];  Service: General;  Laterality: Left;   BREAST BIOPSY Left 07/03/2023   Stereo bx, ribbon clip, path pending   BREAST BIOPSY Left 07/03/2023   MM LT BREAST BX W LOC DEV 1ST LESION IMAGE BX SPEC STEREO GUIDE 07/03/2023 [facility]   BREAST BIOPSY  07/14/2023   MM LT BREAST SAVI/RF TAG 1ST LESION MAMMO GUIDE 07/14/2023 [facility]   BREAST LUMPECTOMY WITH RADIO FREQUENCY LOCALIZER Left 07/17/2023   Procedure: LEFT BREAST PARTIAL MASTECTOMY  WITH RADIO FREQUENCY LOCALIZER;  Surgeon: Eldred Grego, MD;  Location: [facility];  Service: General;  Laterality: Left;   CATARACT EXTRACTION Right 08/31/2017   CESAREAN SECTION     COLONOSCOPY  06/17/2007   Dr Ole Berkeley   COLONOSCOPY WITH PROPOFOL  N/A 07/22/2017   Procedure: COLONOSCOPY WITH  PROPOFOL ;  Surgeon: Marshall Skeeter, MD;  Location: [facility];  Service: Endoscopy;  Laterality: N/A;   TUBAL LIGATION     vitrectomy Right 07/10/2016   done at [facility]   Patient Active Problem List   Diagnosis Date Noted   Atypical ductal hyperplasia of left breast 08/05/2023   Osteopenia 08/05/2023   Ductal carcinoma in situ (DCIS) of left breast 07/09/2023   Osteoarthritis of right knee 10/23/2021   Pain in joint of right knee 10/23/2021   Hyperparathyroidism (HCC) 05/10/2021   Dyslipidemia associated with type 2 diabetes mellitus (HCC) 05/10/2021   Chronic constipation 01/30/2016   Allergic rhinitis 08/13/2015   Carpal tunnel syndrome 08/13/2015   Atherosclerosis of aorta (HCC) 08/13/2015   Nocturia 08/13/2015   Migraine without aura and responsive to treatment 08/13/2015   Thyroid  goiter 08/13/2015   Diabetes mellitus with microalbuminuria (HCC) 03/05/2015   Insomnia 10/12/2014   Hyperlipidemia 10/12/2014    PCP: Dr Ava Lei  REFERRING PROVIDER: Dr Arden Kotyk DIAG: L breast cancer s/p lumpectomy  THERAPY DIAG:  Stiffness of left shoulder, not elsewhere classified  Rationale for Evaluation and Treatment: Rehabilitation  ONSET DATE: 07/17/23  SUBJECTIVE:  SUBJECTIVE STATEMENT: I had my radiation simulation.  And I have been doing these exercises for my shoulders.  I feel it in my shoulders.  The radiation position did not feel too comfortable.  PERTINENT HISTORY:  Patient was diagnosed with left  breast cancer -had left lumpectomy.  No chemo but plan to have radiation Dr Charmel Cooter note 07/30/23: BREAST: Right side seroma of the left partial mastectomy and axillary sentinel node biopsy sites. Currently without erythema or sign of infection.  Assessment & Plan Left ductal carcinoma in  situ (DCIS)  Post-operative evaluation after left partial mastectomy with sentinel lymph node biopsy on Jul 17, 2023, shows no residual carcinoma and one lymph node negative for metastatic carcinoma. Focal atypical hyperplasia is noted. Follow up with medical oncology next week to discuss adjuvant therapies, including radiation therapy.  Seroma of the left breast and axillary area  An uncomplicated seroma is present in the left breast and axillary area, without signs of infection. Observe the seroma and follow up in two weeks. Consider aspiration if the seroma increases in size or causes symptoms.  Ductal carcinoma in situ (DCIS) of left breast [D05.12]   PATIENT GOALS:   reduce lymphedema risk and learn post op HEP.   PAIN:  Patient with some discomfort in shoulder flexion and abduction overhead over deltoid  PRECAUTIONS: Left upper extremity lymphedema   HAND DOMINANCE: Right  WEIGHT BEARING RESTRICTIONS: No  FALLS:  Has patient fallen in last 6 months? 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 [employer]  LEISURE: Likes to watch TV and does her own housework   OBJECTIVE:  COGNITION: Overall cognitive status: Within functional limits for tasks assessed    POSTURE:  Forward head and rounded shoulders posture  UPPER EXTREMITY AROM/PROM:  A/PROM RIGHT   eval   Shoulder extension   Shoulder flexion WFL  Shoulder abduction [facility]  Shoulder internal rotation [facility]  Shoulder external rotation WFL    (Blank rows = not tested)  A/PROM LEFT   eval L 08/12/23 end of session symptom free   Shoulder extension    Shoulder flexion 125 145  Shoulder abduction 100 145  Shoulder internal rotation    Shoulder external rotation 75 80    (Blank rows = not tested)   Treatment session 08/12/2023 Patient arrived after being seen a week ago postop for the first visit.  Upon assessment patient was not doing in supine or active  assisted range of motion on the wall for shoulders.  Patient was doing shoulder flexion abduction against gravity.  Causing some discomfort and pain over the deltoids as well as upper trap.  Patient showed decreased shoulder abduction and external rotation coming in.  Reinforced with patient again to do home exercises in supine as well as on the wall for flexibility postlumpectomy. Patient needed extended reviews to perform exercises correctly moderate demo and tactile cueing verbal cueing.  Supine shoulder flexion active assisted range of motion as well as horizontal abduction.  15 reps 3 times a day As well as supine external rotation relax with gravity 10 reps. Patient with great progress. Reinforced with patient to do a session before radiation.  And then fasting in the morning and later in the day. Patient can also do wall slides for active assisted range of motion for shoulder flexion abduction after supine exercises.  As long as shoulder is pain-free.  CERVICAL AROM: All within normal limits:    UPPER EXTREMITY STRENGTH:  Not tested patient with some discomfort over lateral deltoids with shoulder flexion and abduction.  LYMPHEDEMA ASSESSMENTS:      L-DEX LYMPHEDEMA SCREENING:  The patient was assessed using the L-Dex machine today to produce a lymphedema index baseline score. The patient will be reassessed on a regular basis (typically every 3 months) to obtain new L-Dex scores. If the score is > 6.5 points away from his/her baseline score indicating onset of subclinical lymphedema, it will be recommended to wear a compression garment for 4 weeks, 12 hours per day and then be reassessed. If the score continues to be > 6.5 points from baseline at reassessment, we will initiate lymphedema treatment. Assessing in this manner has a 95% rate of preventing clinically significant lymphedema.  Did not do L-Dex score will repeat halfway through radiation or afterwards.    PATIENT EDUCATION:   Education details: Lymphedema risk reduction to be done next week and post op shoulder/posture HEP done today Person educated: Patient Education method: Explanation, Demonstration, Handout Education comprehension: Patient verbalized understanding and returned demonstration     ASSESSMENT:  CLINICAL IMPRESSION: She had a left lumpectomy with 1 sentinel lymph node removed on 07/17/2023 by Dr. Charmel Cooter.  Patient met with oncology 08/05/23  as well as radiation MD.  No chemo indicated but radiation.  Patient present at evaluation last week with decreased endrange shoulder active range of motion.  Patient follow-up today.  Appear patient was doing active range of motion against gravity.  Causing some shoulder discomfort.  Reviewed with patient again extensively and reinforced importance of doing active assisted range of motion for shoulder flexion abduction external rotation in supine.  Reinforced with patient to do 3 times a day with 1 session especially before radiation.  Patient showed great progress in session.  Patient to follow-up with me first week of radiation   She will benefit from a  OT reassessment to determine needs and from L-Dex screens every 3 months for 2 years to detect subclinical lymphedema.  Pt will benefit from skilled therapeutic intervention to improve on the following deficits: Decreased knowledge of precautions and lymphedema education, impaired UE functional use, pain, decreased ROM, postural dysfunction.   OT treatment/interventions: ADL/self-care home management, pt/family education, therapeutic exercise,manual therapy  REHAB POTENTIAL: Good  CLINICAL DECISION MAKING: Stable/uncomplicated  EVALUATION COMPLEXITY: Low   GOALS: Goals reviewed with patient? YES  LONG TERM GOALS: (STG=LTG)    Name Target Date Goal status  1 Pt will be able to verbalize understanding of pertinent lymphedema risk reduction practices relevant to her dx specifically related to skin care.   Baseline:  No knowledge 4 weeks Initial  2 Pt will be able to return demo and/or verbalize understanding of the post op HEP related to regaining shoulder ROM. Baseline:  No knowledge Today Achieved at eval       4 Pt will demo she has regained full shoulder ROM and function post operatively compared to baselines.  Baseline: See objective measurements taken today. 8 weeks Initial    PLAN:  OT FREQUENCY/DURATION: EVAL and 2-3 follow up appointment.    Occupational Therapy Information for After Breast Cancer Surgery/Treatment:  Lymphedema is a swelling condition that you may be at risk for in your arm if you have lymph nodes removed from the armpit area.  After a sentinel node biopsy, the risk is approximately 5-9% and is higher after an axillary node dissection.  There is treatment available for this condition and it is not life-threatening.  Contact your physician  or occupational therapist with concerns. You may begin the 4 shoulder/posture exercises (see additional sheet) when permitted by your physician (typically a week after surgery).  If you have drains, you may need to wait until those are removed before beginning range of motion exercises.  A general recommendation is to not lift your arms above shoulder height until drains are removed.  These exercises should be done to your tolerance and gently.  This is not a no pain/no gain type of recovery so listen to your body and stretch into the range of motion that you can tolerate, stopping if you have pain.  If you are having immediate reconstruction, ask your plastic surgeon about doing exercises as he or she may want you to wait. .  While undergoing any medical procedure or treatment, try to avoid blood pressure being taken or needle sticks from occurring on the arm on the side of cancer.   This recommendation begins after surgery and continues for the rest of your life.  This may help reduce your risk of getting lymphedema (swelling in your  arm). An excellent resource for those seeking information on lymphedema is the National Lymphedema Network's web site. It can be accessed at www.lymphnet.org If you notice swelling in your hand, arm or breast at any time following surgery (even if it is many years from now), please contact your doctor or occupational therapist to discuss this.  Lymphedema can be treated at any time but it is easier for you if it is treated early on.  If you feel like your shoulder motion is not returning to normal in a reasonable amount of time, please contact your surgeon or occupational therapist.  [facility] [phone]. [address], [place], [place] [address]  Patient was instructed today in a home exercise program today for post op shoulder range of motion. These included active assist shoulder flexion in standing/supine, scapular retraction, wall walking/slides with shoulder abduction, and hands behind head external rotation in supine.  She was encouraged to do these 2-3 x day, holding 3 seconds and repeating 10 times when permitted by her physician/surgeon      Heloise Lobo, OTR/L,CLT 08/12/2023, 5:05 PM

## 2023-08-13 ENCOUNTER — Other Ambulatory Visit: Payer: Self-pay | Admitting: *Deleted

## 2023-08-13 ENCOUNTER — Encounter: Payer: Self-pay | Admitting: *Deleted

## 2023-08-13 DIAGNOSIS — D0512 Intraductal carcinoma in situ of left breast: Secondary | ICD-10-CM

## 2023-08-17 ENCOUNTER — Ambulatory Visit
Admission: RE | Admit: 2023-08-17 | Discharge: 2023-08-17 | Disposition: A | Discharge status: Home / Self Care | Source: Ambulatory Visit | Attending: Radiation Oncology | Admitting: Radiation Oncology

## 2023-08-17 DIAGNOSIS — Z510 Encounter for antineoplastic radiation therapy: Secondary | ICD-10-CM | POA: Diagnosis not present

## 2023-08-18 ENCOUNTER — Other Ambulatory Visit: Payer: Self-pay

## 2023-08-18 ENCOUNTER — Ambulatory Visit
Admission: RE | Admit: 2023-08-18 | Discharge: 2023-08-18 | Disposition: A | Discharge status: Home / Self Care | Source: Ambulatory Visit | Attending: Radiation Oncology | Admitting: Radiation Oncology

## 2023-08-18 ENCOUNTER — Telehealth: Payer: Self-pay

## 2023-08-18 DIAGNOSIS — Z510 Encounter for antineoplastic radiation therapy: Secondary | ICD-10-CM | POA: Diagnosis not present

## 2023-08-18 LAB — RAD ONC ARIA SESSION SUMMARY
Course Elapsed Days: 0
Plan Fractions Treated to Date: 1
Plan Prescribed Dose Per Fraction: 2.66 Gy
Plan Total Fractions Prescribed: 16
Plan Total Prescribed Dose: 42.56 Gy
Reference Point Dosage Given to Date: 2.66 Gy
Reference Point Session Dosage Given: 2.66 Gy
Session Number: 1

## 2023-08-18 NOTE — Telephone Encounter (Signed)
 Additional info for FMLA from 08/13/23 received.  We did not submit FMLA so call placed to patient to make sure it doesn't need to go to another provider.  Patient agrees that FMLA was not completed by [facility] provider and states that she talked with FMLA rep yesterday and they have what they need.

## 2023-08-19 ENCOUNTER — Ambulatory Visit
Admission: RE | Admit: 2023-08-19 | Discharge: 2023-08-19 | Disposition: A | Discharge status: Home / Self Care | Source: Ambulatory Visit | Attending: Radiation Oncology | Admitting: Radiation Oncology

## 2023-08-19 ENCOUNTER — Other Ambulatory Visit: Payer: Self-pay

## 2023-08-19 ENCOUNTER — Inpatient Hospital Stay: Admitting: Occupational Therapy

## 2023-08-19 DIAGNOSIS — M25612 Stiffness of left shoulder, not elsewhere classified: Secondary | ICD-10-CM

## 2023-08-19 DIAGNOSIS — Z510 Encounter for antineoplastic radiation therapy: Secondary | ICD-10-CM | POA: Diagnosis not present

## 2023-08-19 LAB — RAD ONC ARIA SESSION SUMMARY
Course Elapsed Days: 1
Plan Fractions Treated to Date: 2
Plan Prescribed Dose Per Fraction: 2.66 Gy
Plan Total Fractions Prescribed: 16
Plan Total Prescribed Dose: 42.56 Gy
Reference Point Dosage Given to Date: 5.32 Gy
Reference Point Session Dosage Given: 2.66 Gy
Session Number: 2

## 2023-08-19 NOTE — Therapy (Signed)
 OUTPATIENT OCCUPATIONAL THERAPY BREAST CANCER POSTOP  TREATMENT   Patient Name: Laurie Daniels MRN: [id number] DOB:[birth year], 68 y.o., female Today's Date: 08/19/2023  END OF SESSION:  OT End of Session - 08/19/23 1620     Visit Number 3    Number of Visits 4    Date for OT Re-Evaluation 09/30/23    OT Start Time 1430    OT Stop Time 1454    OT Time Calculation (min) 24 min    Activity Tolerance Patient tolerated treatment well    Behavior During Therapy WFL for tasks assessed/performed          Past Medical History:  Diagnosis Date   Anxiety and depression    Atherosclerosis of aorta (HCC)    CTS (carpal tunnel syndrome)    Depressive disorder    Diabetic neuropathy (HCC)    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   Hyperactivity of bladder    Hypertension    Insomnia    Lumbar sprain    Ovarian failure    Rhinitis    Type 2 diabetes mellitus without complication (HCC)    Vaginitis and vulvovaginitis    Past Surgical History:  Procedure Laterality Date   AXILLARY SENTINEL NODE BIOPSY Left 07/17/2023   Procedure: BIOPSY, LYMPH NODE, SENTINEL, AXILLARY;  Surgeon: Eldred Grego, MD;  Location: [facility];  Service: General;  Laterality: Left;   BREAST BIOPSY Left 07/03/2023   Stereo bx, ribbon clip, path pending   BREAST BIOPSY Left 07/03/2023   MM LT BREAST BX W LOC DEV 1ST LESION IMAGE BX SPEC STEREO GUIDE 07/03/2023 [facility]   BREAST BIOPSY  07/14/2023   MM LT BREAST SAVI/RF TAG 1ST LESION MAMMO GUIDE 07/14/2023 [facility]   BREAST LUMPECTOMY WITH RADIO FREQUENCY LOCALIZER Left 07/17/2023   Procedure: LEFT BREAST PARTIAL MASTECTOMY  WITH RADIO FREQUENCY LOCALIZER;  Surgeon: Eldred Grego, MD;  Location: [facility];  Service: General;  Laterality: Left;   CATARACT EXTRACTION Right 08/31/2017   CESAREAN SECTION     COLONOSCOPY  06/17/2007   Dr Ole Berkeley   COLONOSCOPY WITH PROPOFOL  N/A 07/22/2017   Procedure: COLONOSCOPY WITH  PROPOFOL ;  Surgeon: Marshall Skeeter, MD;  Location: [facility];  Service: Endoscopy;  Laterality: N/A;   TUBAL LIGATION     vitrectomy Right 07/10/2016   done at [facility]   Patient Active Problem List   Diagnosis Date Noted   Atypical ductal hyperplasia of left breast 08/05/2023   Osteopenia 08/05/2023   Ductal carcinoma in situ (DCIS) of left breast 07/09/2023   Osteoarthritis of right knee 10/23/2021   Pain in joint of right knee 10/23/2021   Hyperparathyroidism (HCC) 05/10/2021   Dyslipidemia associated with type 2 diabetes mellitus (HCC) 05/10/2021   Chronic constipation 01/30/2016   Allergic rhinitis 08/13/2015   Carpal tunnel syndrome 08/13/2015   Atherosclerosis of aorta (HCC) 08/13/2015   Nocturia 08/13/2015   Migraine without aura and responsive to treatment 08/13/2015   Thyroid  goiter 08/13/2015   Diabetes mellitus with microalbuminuria (HCC) 03/05/2015   Insomnia 10/12/2014   Hyperlipidemia 10/12/2014    PCP: Dr Ava Lei  REFERRING PROVIDER: Dr Arden Kotyk DIAG: L breast cancer s/p lumpectomy  THERAPY DIAG:  Stiffness of left shoulder, not elsewhere classified  Rationale for Evaluation and Treatment: Rehabilitation  ONSET DATE: 07/17/23  SUBJECTIVE:  SUBJECTIVE STATEMENT: It felt much better since  doing the exercise you told me last time .  Did not had pain in the radiation position.  I am doing the exercises lying down and on the wall like you told me. PERTINENT HISTORY:  Patient was diagnosed with left  breast cancer -had left lumpectomy.  No chemo but plan to have radiation Dr Charmel Cooter note 07/30/23: BREAST: Right side seroma of the left partial mastectomy and axillary sentinel node biopsy sites. Currently without erythema or sign of infection.  Assessment & Plan Left  ductal carcinoma in situ (DCIS)  Post-operative evaluation after left partial mastectomy with sentinel lymph node biopsy on Jul 17, 2023, shows no residual carcinoma and one lymph node negative for metastatic carcinoma. Focal atypical hyperplasia is noted. Follow up with medical oncology next week to discuss adjuvant therapies, including radiation therapy.  Seroma of the left breast and axillary area  An uncomplicated seroma is present in the left breast and axillary area, without signs of infection. Observe the seroma and follow up in two weeks. Consider aspiration if the seroma increases in size or causes symptoms.  Ductal carcinoma in situ (DCIS) of left breast [D05.12]   PATIENT GOALS:   reduce lymphedema risk and learn post op HEP.   PAIN:  Patient with some discomfort in shoulder flexion and abduction overhead over deltoid  PRECAUTIONS: Left upper extremity lymphedema   HAND DOMINANCE: Right  WEIGHT BEARING RESTRICTIONS: No  FALLS:  Has patient fallen in last 6 months? 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 [employer]  LEISURE: Likes to watch TV and does her own housework   OBJECTIVE:  COGNITION: Overall cognitive status: Within functional limits for tasks assessed    POSTURE:  Forward head and rounded shoulders posture  UPPER EXTREMITY AROM/PROM:  A/PROM RIGHT   eval   Shoulder extension   Shoulder flexion WFL  Shoulder abduction [facility]  Shoulder internal rotation [facility]  Shoulder external rotation WFL    (Blank rows = not tested)  A/PROM LEFT   eval L 08/12/23 end of session symptom free  L 08/19/23  Shoulder extension     Shoulder flexion 125 145 160  Shoulder abduction 100 145 160  Shoulder internal rotation     Shoulder external rotation 75 80 85    (Blank rows = not tested)   Treatment session 08/19/2023 Patient arrived after being seen last week.  Patient was doing her exercises and standing  against gravity causing some deltoid and shoulder discomfort.  As well as discomfort during radiation position. Patient returns today with great improvement of active range of motion overhead.  Patient do has a tendency to go into scaption more than abduction.  Pain-free.  Circumference within normal limits as well as L-Dex score. R elbow 24.5 and L 25 cm , 10 cm R 29.5 and L 30.4 cm and 15 cm R32.5 cm and L 32 cm  Patient is right-hand but used her left hand for carrying objects.  Reinforced with patient again to do home exercises in supine as well as on the wall for flexibility postlumpectomy. As well as supine external rotation relax with gravity 10 reps.  Reinforced with patient to do a session in the morning as well as before radiation.  Patient can also do wall slides for active assisted range of motion for shoulder flexion abduction after supine exercises.  As long as shoulder is pain-free. Reinforced with patient to continue  with her home exercises throughout radiation.  CERVICAL AROM: All within normal limits:    UPPER EXTREMITY STRENGTH: Not tested patient with some discomfort over lateral deltoids with shoulder flexion and abduction.  LYMPHEDEMA ASSESSMENTS:      L-DEX LYMPHEDEMA SCREENING:  The patient was assessed using the L-Dex machine today to produce a lymphedema index baseline score. The patient will be reassessed on a regular basis (typically every 3 months) to obtain new L-Dex scores. If the score is > 6.5 points away from his/her baseline score indicating onset of subclinical lymphedema, it will be recommended to wear a compression garment for 4 weeks, 12 hours per day and then be reassessed. If the score continues to be > 6.5 points from baseline at reassessment, we will initiate lymphedema treatment. Assessing in this manner has a 95% rate of preventing clinically significant lymphedema.   L-DEX FLOWSHEETS - 08/19/23 1600       L-DEX LYMPHEDEMA SCREENING    Measurement Type Unilateral    L-DEX MEASUREMENT EXTREMITY Upper Extremity    POSITION  Seated    DOMINANT SIDE Right    At Risk Side Left    BASELINE SCORE (UNILATERAL) 1.5    L-DEX SCORE (UNILATERAL) 1.1    VALUE CHANGE (UNILAT) -0.4            PATIENT EDUCATION:  Education details: Lymphedema risk reduction to be done next week and post op shoulder/posture HEP done today Person educated: Patient Education method: Explanation, Demonstration, Handout Education comprehension: Patient verbalized understanding and returned demonstration     ASSESSMENT:  CLINICAL IMPRESSION: She had a left lumpectomy with 1 sentinel lymph node removed on 07/17/2023 by Dr. Charmel Cooter.  Patient met with oncology 08/05/23  as well as radiation MD.  No chemo indicated but radiation.   Patient showed great progress in bilateral shoulder range of motion compared to last week.  Patient was educated last week and extended review of active assisted range of motion in supine for shoulder flexion and abduction and external rotation to decrease risk for shoulder pain during radiation.  Patient arrived today with great improvement.  With less pain and discomfort in shoulder.  Verbalized tolerated radiation position well.  Patient circumference and L-Dex score within normal limits.  Patient can follow-up with breast navigator at the end of radiation  and L-Dex screens every 3 months for 2 years to detect subclinical lymphedema.  Pt will benefit from skilled therapeutic intervention to improve on the following deficits: Decreased knowledge of precautions and lymphedema education, impaired UE functional use, pain, decreased ROM, postural dysfunction.   OT treatment/interventions: ADL/self-care home management, pt/family education, therapeutic exercise,manual therapy  REHAB POTENTIAL: Good  CLINICAL DECISION MAKING: Stable/uncomplicated  EVALUATION COMPLEXITY: Low   GOALS: Goals reviewed with patient? YES  LONG TERM  GOALS: (STG=LTG)    Name Target Date Goal status  1 Pt will be able to verbalize understanding of pertinent lymphedema risk reduction practices relevant to her dx specifically related to skin care.  Baseline:  No knowledge 4 weeks Met  2 Pt will be able to return demo and/or verbalize understanding of the post op HEP related to regaining shoulder ROM. Baseline:  No knowledge Today Achieved at eval       4 Pt will demo she has regained full shoulder ROM and function post operatively compared to baselines.  Baseline: See objective measurements taken today. 8 weeks Met    PLAN:  OT FREQUENCY/DURATION: EVAL and 2-3 follow up appointment.    Occupational  Therapy Information for After Breast Cancer Surgery/Treatment:  Lymphedema is a swelling condition that you may be at risk for in your arm if you have lymph nodes removed from the armpit area.  After a sentinel node biopsy, the risk is approximately 5-9% and is higher after an axillary node dissection.  There is treatment available for this condition and it is not life-threatening.  Contact your physician or occupational therapist with concerns. You may begin the 4 shoulder/posture exercises (see additional sheet) when permitted by your physician (typically a week after surgery).  If you have drains, you may need to wait until those are removed before beginning range of motion exercises.  A general recommendation is to not lift your arms above shoulder height until drains are removed.  These exercises should be done to your tolerance and gently.  This is not a no pain/no gain type of recovery so listen to your body and stretch into the range of motion that you can tolerate, stopping if you have pain.  If you are having immediate reconstruction, ask your plastic surgeon about doing exercises as he or she may want you to wait. .  While undergoing any medical procedure or treatment, try to avoid blood pressure being taken or needle sticks from  occurring on the arm on the side of cancer.   This recommendation begins after surgery and continues for the rest of your life.  This may help reduce your risk of getting lymphedema (swelling in your arm). An excellent resource for those seeking information on lymphedema is the National Lymphedema Network's web site. It can be accessed at www.lymphnet.org If you notice swelling in your hand, arm or breast at any time following surgery (even if it is many years from now), please contact your doctor or occupational therapist to discuss this.  Lymphedema can be treated at any time but it is easier for you if it is treated early on.  If you feel like your shoulder motion is not returning to normal in a reasonable amount of time, please contact your surgeon or occupational therapist.  [facility] [phone]. [address] [place], [place] [address]  Patient was instructed today in a home exercise program today for post op shoulder range of motion. These included active assist shoulder flexion in standing/supine, scapular retraction, wall walking/slides with shoulder abduction, and hands behind head external rotation in supine.  She was encouraged to do these 2-3 x day, holding 3 seconds and repeating 10 times when permitted by her physician/surgeon      Heloise Lobo, OTR/L,CLT 08/19/2023, 4:22 PM

## 2023-08-20 ENCOUNTER — Ambulatory Visit
Admission: RE | Admit: 2023-08-20 | Discharge: 2023-08-20 | Disposition: A | Discharge status: Home / Self Care | Source: Ambulatory Visit | Attending: Radiation Oncology | Admitting: Radiation Oncology

## 2023-08-20 ENCOUNTER — Other Ambulatory Visit: Payer: Self-pay

## 2023-08-20 ENCOUNTER — Telehealth: Payer: Self-pay | Admitting: *Deleted

## 2023-08-20 DIAGNOSIS — Z510 Encounter for antineoplastic radiation therapy: Secondary | ICD-10-CM | POA: Diagnosis not present

## 2023-08-20 LAB — RAD ONC ARIA SESSION SUMMARY
Course Elapsed Days: 2
Plan Fractions Treated to Date: 3
Plan Prescribed Dose Per Fraction: 2.66 Gy
Plan Total Fractions Prescribed: 16
Plan Total Prescribed Dose: 42.56 Gy
Reference Point Dosage Given to Date: 7.98 Gy
Reference Point Session Dosage Given: 2.66 Gy
Session Number: 3

## 2023-08-20 NOTE — Telephone Encounter (Signed)
 Laurie Daniels with [employer] and she had some changes and has that she says that she can come back to work but she has to leave at 1:30 they want somebody and radiation to give them a call and how many days will she be on this leaving at 1:30 and will when she is going to be completed with the radiation altogether

## 2023-08-20 NOTE — Telephone Encounter (Signed)
 Returned call to [employer] left message with last treatment date and phone number to our offive.

## 2023-08-21 ENCOUNTER — Ambulatory Visit
Admission: RE | Admit: 2023-08-21 | Discharge: 2023-08-21 | Disposition: A | Discharge status: Home / Self Care | Source: Ambulatory Visit | Attending: Radiation Oncology | Admitting: Radiation Oncology

## 2023-08-21 ENCOUNTER — Other Ambulatory Visit: Payer: Self-pay

## 2023-08-21 DIAGNOSIS — Z510 Encounter for antineoplastic radiation therapy: Secondary | ICD-10-CM | POA: Diagnosis not present

## 2023-08-21 LAB — RAD ONC ARIA SESSION SUMMARY
Course Elapsed Days: 3
Plan Fractions Treated to Date: 4
Plan Prescribed Dose Per Fraction: 2.66 Gy
Plan Total Fractions Prescribed: 16
Plan Total Prescribed Dose: 42.56 Gy
Reference Point Dosage Given to Date: 10.64 Gy
Reference Point Session Dosage Given: 2.66 Gy
Session Number: 4

## 2023-08-24 ENCOUNTER — Inpatient Hospital Stay

## 2023-08-24 ENCOUNTER — Ambulatory Visit
Admission: RE | Admit: 2023-08-24 | Discharge: 2023-08-24 | Disposition: A | Discharge status: Home / Self Care | Source: Ambulatory Visit | Attending: Radiation Oncology | Admitting: Radiation Oncology

## 2023-08-24 ENCOUNTER — Other Ambulatory Visit: Payer: Self-pay

## 2023-08-24 DIAGNOSIS — Z510 Encounter for antineoplastic radiation therapy: Secondary | ICD-10-CM | POA: Diagnosis not present

## 2023-08-24 DIAGNOSIS — D0512 Intraductal carcinoma in situ of left breast: Secondary | ICD-10-CM

## 2023-08-24 LAB — RAD ONC ARIA SESSION SUMMARY
Course Elapsed Days: 6
Plan Fractions Treated to Date: 5
Plan Prescribed Dose Per Fraction: 2.66 Gy
Plan Total Fractions Prescribed: 16
Plan Total Prescribed Dose: 42.56 Gy
Reference Point Dosage Given to Date: 13.3 Gy
Reference Point Session Dosage Given: 2.66 Gy
Session Number: 5

## 2023-08-24 LAB — CBC (CANCER CENTER ONLY)
HCT: 36.4 % (ref 36.0–46.0)
Hemoglobin: 11.5 g/dL — ABNORMAL LOW (ref 12.0–15.0)
MCH: 28.7 pg (ref 26.0–34.0)
MCHC: 31.6 g/dL (ref 30.0–36.0)
MCV: 90.8 fL (ref 80.0–100.0)
Platelet Count: 204 10*3/uL (ref 150–400)
RBC: 4.01 MIL/uL (ref 3.87–5.11)
RDW: 13.9 % (ref 11.5–15.5)
WBC Count: 10.4 10*3/uL (ref 4.0–10.5)
nRBC: 0 % (ref 0.0–0.2)

## 2023-08-25 ENCOUNTER — Other Ambulatory Visit: Payer: Self-pay

## 2023-08-25 ENCOUNTER — Ambulatory Visit
Admission: RE | Admit: 2023-08-25 | Discharge: 2023-08-25 | Disposition: A | Discharge status: Home / Self Care | Source: Ambulatory Visit | Attending: Radiation Oncology | Admitting: Radiation Oncology

## 2023-08-25 DIAGNOSIS — Z510 Encounter for antineoplastic radiation therapy: Secondary | ICD-10-CM | POA: Diagnosis not present

## 2023-08-25 LAB — RAD ONC ARIA SESSION SUMMARY
Course Elapsed Days: 7
Plan Fractions Treated to Date: 6
Plan Prescribed Dose Per Fraction: 2.66 Gy
Plan Total Fractions Prescribed: 16
Plan Total Prescribed Dose: 42.56 Gy
Reference Point Dosage Given to Date: 15.96 Gy
Reference Point Session Dosage Given: 2.66 Gy
Session Number: 6

## 2023-08-26 ENCOUNTER — Other Ambulatory Visit: Payer: Self-pay

## 2023-08-26 ENCOUNTER — Ambulatory Visit
Admission: RE | Admit: 2023-08-26 | Discharge: 2023-08-26 | Disposition: A | Discharge status: Home / Self Care | Source: Ambulatory Visit | Attending: Radiation Oncology | Admitting: Radiation Oncology

## 2023-08-26 DIAGNOSIS — Z510 Encounter for antineoplastic radiation therapy: Secondary | ICD-10-CM | POA: Diagnosis not present

## 2023-08-26 LAB — RAD ONC ARIA SESSION SUMMARY
Course Elapsed Days: 8
Plan Fractions Treated to Date: 7
Plan Prescribed Dose Per Fraction: 2.66 Gy
Plan Total Fractions Prescribed: 16
Plan Total Prescribed Dose: 42.56 Gy
Reference Point Dosage Given to Date: 18.62 Gy
Reference Point Session Dosage Given: 2.66 Gy
Session Number: 7

## 2023-08-27 ENCOUNTER — Ambulatory Visit
Admission: RE | Admit: 2023-08-27 | Discharge: 2023-08-27 | Disposition: A | Discharge status: Home / Self Care | Source: Ambulatory Visit | Attending: Radiation Oncology | Admitting: Radiation Oncology

## 2023-08-27 ENCOUNTER — Other Ambulatory Visit: Payer: Self-pay

## 2023-08-27 DIAGNOSIS — Z510 Encounter for antineoplastic radiation therapy: Secondary | ICD-10-CM | POA: Diagnosis not present

## 2023-08-27 LAB — RAD ONC ARIA SESSION SUMMARY
Course Elapsed Days: 9
Plan Fractions Treated to Date: 8
Plan Prescribed Dose Per Fraction: 2.66 Gy
Plan Total Fractions Prescribed: 16
Plan Total Prescribed Dose: 42.56 Gy
Reference Point Dosage Given to Date: 21.28 Gy
Reference Point Session Dosage Given: 2.66 Gy
Session Number: 8

## 2023-08-28 ENCOUNTER — Other Ambulatory Visit: Payer: Self-pay

## 2023-08-28 ENCOUNTER — Ambulatory Visit
Admission: RE | Admit: 2023-08-28 | Discharge: 2023-08-28 | Disposition: A | Discharge status: Home / Self Care | Source: Ambulatory Visit | Attending: Radiation Oncology | Admitting: Radiation Oncology

## 2023-08-28 DIAGNOSIS — Z510 Encounter for antineoplastic radiation therapy: Secondary | ICD-10-CM | POA: Diagnosis not present

## 2023-08-28 LAB — RAD ONC ARIA SESSION SUMMARY
Course Elapsed Days: 10
Plan Fractions Treated to Date: 9
Plan Prescribed Dose Per Fraction: 2.66 Gy
Plan Total Fractions Prescribed: 16
Plan Total Prescribed Dose: 42.56 Gy
Reference Point Dosage Given to Date: 23.94 Gy
Reference Point Session Dosage Given: 2.66 Gy
Session Number: 9

## 2023-08-31 ENCOUNTER — Ambulatory Visit
Admission: RE | Admit: 2023-08-31 | Discharge: 2023-08-31 | Disposition: A | Discharge status: Home / Self Care | Source: Ambulatory Visit | Attending: Radiation Oncology | Admitting: Radiation Oncology

## 2023-08-31 ENCOUNTER — Other Ambulatory Visit: Payer: Self-pay

## 2023-08-31 DIAGNOSIS — Z510 Encounter for antineoplastic radiation therapy: Secondary | ICD-10-CM | POA: Diagnosis not present

## 2023-08-31 LAB — RAD ONC ARIA SESSION SUMMARY
Course Elapsed Days: 13
Plan Fractions Treated to Date: 10
Plan Prescribed Dose Per Fraction: 2.66 Gy
Plan Total Fractions Prescribed: 16
Plan Total Prescribed Dose: 42.56 Gy
Reference Point Dosage Given to Date: 26.6 Gy
Reference Point Session Dosage Given: 2.66 Gy
Session Number: 10

## 2023-08-31 LAB — HM DIABETES EYE EXAM

## 2023-09-01 ENCOUNTER — Other Ambulatory Visit: Payer: Self-pay

## 2023-09-01 ENCOUNTER — Ambulatory Visit
Admission: RE | Admit: 2023-09-01 | Discharge: 2023-09-01 | Disposition: A | Discharge status: Home / Self Care | Source: Ambulatory Visit | Attending: Radiation Oncology | Admitting: Radiation Oncology

## 2023-09-01 DIAGNOSIS — Z170 Estrogen receptor positive status [ER+]: Secondary | ICD-10-CM | POA: Insufficient documentation

## 2023-09-01 DIAGNOSIS — D0512 Intraductal carcinoma in situ of left breast: Secondary | ICD-10-CM | POA: Insufficient documentation

## 2023-09-01 DIAGNOSIS — Z510 Encounter for antineoplastic radiation therapy: Secondary | ICD-10-CM | POA: Diagnosis present

## 2023-09-01 DIAGNOSIS — M25612 Stiffness of left shoulder, not elsewhere classified: Secondary | ICD-10-CM | POA: Insufficient documentation

## 2023-09-01 LAB — RAD ONC ARIA SESSION SUMMARY
Course Elapsed Days: 14
Plan Fractions Treated to Date: 11
Plan Prescribed Dose Per Fraction: 2.66 Gy
Plan Total Fractions Prescribed: 16
Plan Total Prescribed Dose: 42.56 Gy
Reference Point Dosage Given to Date: 29.26 Gy
Reference Point Session Dosage Given: 2.66 Gy
Session Number: 11

## 2023-09-02 ENCOUNTER — Ambulatory Visit
Admission: RE | Admit: 2023-09-02 | Discharge: 2023-09-02 | Disposition: A | Discharge status: Home / Self Care | Source: Ambulatory Visit | Attending: Radiation Oncology | Admitting: Radiation Oncology

## 2023-09-02 ENCOUNTER — Other Ambulatory Visit: Payer: Self-pay

## 2023-09-02 DIAGNOSIS — Z510 Encounter for antineoplastic radiation therapy: Secondary | ICD-10-CM | POA: Diagnosis not present

## 2023-09-02 LAB — RAD ONC ARIA SESSION SUMMARY
Course Elapsed Days: 15
Plan Fractions Treated to Date: 12
Plan Prescribed Dose Per Fraction: 2.66 Gy
Plan Total Fractions Prescribed: 16
Plan Total Prescribed Dose: 42.56 Gy
Reference Point Dosage Given to Date: 31.92 Gy
Reference Point Session Dosage Given: 2.66 Gy
Session Number: 12

## 2023-09-03 ENCOUNTER — Ambulatory Visit
Admission: RE | Admit: 2023-09-03 | Discharge: 2023-09-03 | Disposition: A | Discharge status: Home / Self Care | Source: Ambulatory Visit | Attending: Radiation Oncology | Admitting: Radiation Oncology

## 2023-09-03 ENCOUNTER — Other Ambulatory Visit: Payer: Self-pay

## 2023-09-03 DIAGNOSIS — Z510 Encounter for antineoplastic radiation therapy: Secondary | ICD-10-CM | POA: Diagnosis not present

## 2023-09-03 LAB — RAD ONC ARIA SESSION SUMMARY
Course Elapsed Days: 16
Plan Fractions Treated to Date: 13
Plan Prescribed Dose Per Fraction: 2.66 Gy
Plan Total Fractions Prescribed: 16
Plan Total Prescribed Dose: 42.56 Gy
Reference Point Dosage Given to Date: 34.58 Gy
Reference Point Session Dosage Given: 2.66 Gy
Session Number: 13

## 2023-09-04 DIAGNOSIS — Z510 Encounter for antineoplastic radiation therapy: Secondary | ICD-10-CM | POA: Diagnosis not present

## 2023-09-07 ENCOUNTER — Inpatient Hospital Stay

## 2023-09-07 ENCOUNTER — Ambulatory Visit
Admission: RE | Admit: 2023-09-07 | Discharge: 2023-09-07 | Disposition: A | Discharge status: Home / Self Care | Source: Ambulatory Visit | Attending: Radiation Oncology | Admitting: Radiation Oncology

## 2023-09-07 ENCOUNTER — Other Ambulatory Visit: Payer: Self-pay

## 2023-09-07 DIAGNOSIS — D0512 Intraductal carcinoma in situ of left breast: Secondary | ICD-10-CM | POA: Insufficient documentation

## 2023-09-07 DIAGNOSIS — Z170 Estrogen receptor positive status [ER+]: Secondary | ICD-10-CM | POA: Insufficient documentation

## 2023-09-07 DIAGNOSIS — Z510 Encounter for antineoplastic radiation therapy: Secondary | ICD-10-CM | POA: Diagnosis not present

## 2023-09-07 LAB — RAD ONC ARIA SESSION SUMMARY
Course Elapsed Days: 20
Plan Fractions Treated to Date: 14
Plan Prescribed Dose Per Fraction: 2.66 Gy
Plan Total Fractions Prescribed: 16
Plan Total Prescribed Dose: 42.56 Gy
Reference Point Dosage Given to Date: 37.24 Gy
Reference Point Session Dosage Given: 2.66 Gy
Session Number: 14

## 2023-09-07 LAB — CBC (CANCER CENTER ONLY)
HCT: 36.5 % (ref 36.0–46.0)
Hemoglobin: 11.6 g/dL — ABNORMAL LOW (ref 12.0–15.0)
MCH: 28.9 pg (ref 26.0–34.0)
MCHC: 31.8 g/dL (ref 30.0–36.0)
MCV: 90.8 fL (ref 80.0–100.0)
Platelet Count: 231 K/uL (ref 150–400)
RBC: 4.02 MIL/uL (ref 3.87–5.11)
RDW: 13.8 % (ref 11.5–15.5)
WBC Count: 7.6 K/uL (ref 4.0–10.5)
nRBC: 0 % (ref 0.0–0.2)

## 2023-09-08 ENCOUNTER — Ambulatory Visit
Admission: RE | Admit: 2023-09-08 | Discharge: 2023-09-08 | Disposition: A | Discharge status: Home / Self Care | Source: Ambulatory Visit | Attending: Radiation Oncology | Admitting: Radiation Oncology

## 2023-09-08 ENCOUNTER — Other Ambulatory Visit: Payer: Self-pay

## 2023-09-08 DIAGNOSIS — Z510 Encounter for antineoplastic radiation therapy: Secondary | ICD-10-CM | POA: Diagnosis not present

## 2023-09-08 LAB — RAD ONC ARIA SESSION SUMMARY
Course Elapsed Days: 21
Plan Fractions Treated to Date: 15
Plan Prescribed Dose Per Fraction: 2.66 Gy
Plan Total Fractions Prescribed: 16
Plan Total Prescribed Dose: 42.56 Gy
Reference Point Dosage Given to Date: 39.9 Gy
Reference Point Session Dosage Given: 2.66 Gy
Session Number: 15

## 2023-09-09 ENCOUNTER — Other Ambulatory Visit: Payer: Self-pay

## 2023-09-09 ENCOUNTER — Ambulatory Visit
Admission: RE | Admit: 2023-09-09 | Discharge: 2023-09-09 | Disposition: A | Discharge status: Home / Self Care | Source: Ambulatory Visit | Attending: Radiation Oncology | Admitting: Radiation Oncology

## 2023-09-09 DIAGNOSIS — Z510 Encounter for antineoplastic radiation therapy: Secondary | ICD-10-CM | POA: Diagnosis not present

## 2023-09-09 LAB — RAD ONC ARIA SESSION SUMMARY
Course Elapsed Days: 22
Plan Fractions Treated to Date: 16
Plan Prescribed Dose Per Fraction: 2.66 Gy
Plan Total Fractions Prescribed: 16
Plan Total Prescribed Dose: 42.56 Gy
Reference Point Dosage Given to Date: 42.56 Gy
Reference Point Session Dosage Given: 2.66 Gy
Session Number: 16

## 2023-09-10 ENCOUNTER — Ambulatory Visit
Admission: RE | Admit: 2023-09-10 | Discharge: 2023-09-10 | Disposition: A | Discharge status: Home / Self Care | Source: Ambulatory Visit | Attending: Radiation Oncology | Admitting: Radiation Oncology

## 2023-09-10 ENCOUNTER — Other Ambulatory Visit: Payer: Self-pay

## 2023-09-10 DIAGNOSIS — Z510 Encounter for antineoplastic radiation therapy: Secondary | ICD-10-CM | POA: Diagnosis not present

## 2023-09-10 LAB — RAD ONC ARIA SESSION SUMMARY
Course Elapsed Days: 23
Plan Fractions Treated to Date: 1
Plan Prescribed Dose Per Fraction: 2 Gy
Plan Total Fractions Prescribed: 5
Plan Total Prescribed Dose: 10 Gy
Reference Point Dosage Given to Date: 2 Gy
Reference Point Session Dosage Given: 2 Gy
Session Number: 17

## 2023-09-11 ENCOUNTER — Ambulatory Visit
Admission: RE | Admit: 2023-09-11 | Discharge: 2023-09-11 | Disposition: A | Discharge status: Home / Self Care | Source: Ambulatory Visit | Attending: Radiation Oncology | Admitting: Radiation Oncology

## 2023-09-11 ENCOUNTER — Other Ambulatory Visit: Payer: Self-pay

## 2023-09-11 DIAGNOSIS — Z510 Encounter for antineoplastic radiation therapy: Secondary | ICD-10-CM | POA: Diagnosis not present

## 2023-09-11 LAB — RAD ONC ARIA SESSION SUMMARY
Course Elapsed Days: 24
Plan Fractions Treated to Date: 2
Plan Prescribed Dose Per Fraction: 2 Gy
Plan Total Fractions Prescribed: 5
Plan Total Prescribed Dose: 10 Gy
Reference Point Dosage Given to Date: 4 Gy
Reference Point Session Dosage Given: 2 Gy
Session Number: 18

## 2023-09-14 ENCOUNTER — Ambulatory Visit
Admission: RE | Admit: 2023-09-14 | Discharge: 2023-09-14 | Disposition: A | Discharge status: Home / Self Care | Source: Ambulatory Visit | Attending: Radiation Oncology | Admitting: Radiation Oncology

## 2023-09-14 ENCOUNTER — Other Ambulatory Visit: Payer: Self-pay | Admitting: Family Medicine

## 2023-09-14 ENCOUNTER — Other Ambulatory Visit: Payer: Self-pay

## 2023-09-14 DIAGNOSIS — E1169 Type 2 diabetes mellitus with other specified complication: Secondary | ICD-10-CM

## 2023-09-14 DIAGNOSIS — Z510 Encounter for antineoplastic radiation therapy: Secondary | ICD-10-CM | POA: Diagnosis not present

## 2023-09-14 DIAGNOSIS — F5101 Primary insomnia: Secondary | ICD-10-CM

## 2023-09-14 DIAGNOSIS — I700 Atherosclerosis of aorta: Secondary | ICD-10-CM

## 2023-09-14 LAB — RAD ONC ARIA SESSION SUMMARY
Course Elapsed Days: 27
Plan Fractions Treated to Date: 3
Plan Prescribed Dose Per Fraction: 2 Gy
Plan Total Fractions Prescribed: 5
Plan Total Prescribed Dose: 10 Gy
Reference Point Dosage Given to Date: 6 Gy
Reference Point Session Dosage Given: 2 Gy
Session Number: 19

## 2023-09-15 ENCOUNTER — Other Ambulatory Visit: Payer: Self-pay

## 2023-09-15 ENCOUNTER — Ambulatory Visit
Admission: RE | Admit: 2023-09-15 | Discharge: 2023-09-15 | Disposition: A | Discharge status: Home / Self Care | Source: Ambulatory Visit | Attending: Radiation Oncology | Admitting: Radiation Oncology

## 2023-09-15 DIAGNOSIS — Z510 Encounter for antineoplastic radiation therapy: Secondary | ICD-10-CM | POA: Diagnosis not present

## 2023-09-15 LAB — RAD ONC ARIA SESSION SUMMARY
Course Elapsed Days: 28
Plan Fractions Treated to Date: 4
Plan Prescribed Dose Per Fraction: 2 Gy
Plan Total Fractions Prescribed: 5
Plan Total Prescribed Dose: 10 Gy
Reference Point Dosage Given to Date: 8 Gy
Reference Point Session Dosage Given: 2 Gy
Session Number: 20

## 2023-09-16 ENCOUNTER — Ambulatory Visit
Admission: RE | Admit: 2023-09-16 | Discharge: 2023-09-16 | Disposition: A | Discharge status: Home / Self Care | Source: Ambulatory Visit | Attending: Radiation Oncology | Admitting: Radiation Oncology

## 2023-09-16 ENCOUNTER — Encounter: Payer: Self-pay | Admitting: *Deleted

## 2023-09-16 ENCOUNTER — Other Ambulatory Visit: Payer: Self-pay

## 2023-09-16 DIAGNOSIS — Z510 Encounter for antineoplastic radiation therapy: Secondary | ICD-10-CM | POA: Diagnosis not present

## 2023-09-16 LAB — RAD ONC ARIA SESSION SUMMARY
Course Elapsed Days: 29
Plan Fractions Treated to Date: 5
Plan Prescribed Dose Per Fraction: 2 Gy
Plan Total Fractions Prescribed: 5
Plan Total Prescribed Dose: 10 Gy
Reference Point Dosage Given to Date: 10 Gy
Reference Point Session Dosage Given: 2 Gy
Session Number: 21

## 2023-09-17 NOTE — Radiation Completion Notes (Signed)
 Patient Name: Laurie Daniels, Laurie Daniels MRN: [id number] Date of Birth: May 26, 1955 Referring Physician: ZELPHIA CAP, M.D. Date of Service: 2023-09-17 Radiation Oncologist: Marcey Penton, M.D. [facility]                             RADIATION ONCOLOGY END OF TREATMENT NOTE     Diagnosis: D05.12 Intraductal carcinoma in situ of left breast Staging on 2023-07-09: Ductal carcinoma in situ (DCIS) of left breast T=cTis (DCIS), N=cN0, M=cM0 Intent: Curative     HPI: Patient is a 68 year old female who presented with an abnormal mammogram of her left breast.  There was a 5 mm group of amorphous calcifications in the upper outer quadrant with low suspicion for malignancy.  She underwent stereotactic biopsy which was positive for a 0.2 cm area of high-grade ductal carcinoma in situ focally micro vision cannot be entirely ruled out.  Tumor was ER positive strongly.She went on to have a wide local excision showing breast tissue with extensive inflammation informatory reaction.  No evidence of residual carcinoma was seen.  2 sentinel lymph nodes were negative for metastatic disease.  Patient has done well postoperatively specifically denies breast tenderness cough or bone pain.  She has been seen by medical oncology and will be treated with endocrine therapy after completion of radiation.      ==========DELIVERED PLANS==========  First Treatment Date: 2023-08-18 Last Treatment Date: 2023-09-16   Plan Name: Breast_L Site: Breast, Left Technique: 3D Mode: Photon Dose Per Fraction: 2.66 Gy Prescribed Dose (Delivered / Prescribed): 42.56 Gy / 42.56 Gy Prescribed Fxs (Delivered / Prescribed): 16 / 16   Plan Name: Breast_L_Bst Site: Breast, Left Technique: 3D Mode: Photon Dose Per Fraction: 2 Gy Prescribed Dose (Delivered / Prescribed): 10 Gy / 10 Gy Prescribed Fxs (Delivered / Prescribed): 5 / 5     ==========ON TREATMENT VISIT DATES========== 2023-08-18, 2023-08-25, 2023-09-01,  2023-09-08, 2023-09-14     ==========UPCOMING VISITS==========       ==========APPENDIX - ON TREATMENT VISIT NOTES==========   See weekly On Treatment Notes in Epic for details in the Media tab (listed as Progress notes on the On Treatment Visit Dates listed above).

## 2023-09-30 ENCOUNTER — Other Ambulatory Visit: Payer: Self-pay | Admitting: Family Medicine

## 2023-09-30 ENCOUNTER — Telehealth: Payer: Self-pay | Admitting: *Deleted

## 2023-09-30 ENCOUNTER — Inpatient Hospital Stay (HOSPITAL_BASED_OUTPATIENT_CLINIC_OR_DEPARTMENT_OTHER): Admitting: Oncology

## 2023-09-30 ENCOUNTER — Encounter: Payer: Self-pay | Admitting: Oncology

## 2023-09-30 VITALS — BP 124/85 | HR 65 | Temp 98.1°F | Resp 18 | Wt 149.5 lb

## 2023-09-30 DIAGNOSIS — Z510 Encounter for antineoplastic radiation therapy: Secondary | ICD-10-CM | POA: Diagnosis not present

## 2023-09-30 DIAGNOSIS — I700 Atherosclerosis of aorta: Secondary | ICD-10-CM

## 2023-09-30 DIAGNOSIS — M8580 Other specified disorders of bone density and structure, unspecified site: Secondary | ICD-10-CM | POA: Diagnosis not present

## 2023-09-30 DIAGNOSIS — E1169 Type 2 diabetes mellitus with other specified complication: Secondary | ICD-10-CM

## 2023-09-30 DIAGNOSIS — D0512 Intraductal carcinoma in situ of left breast: Secondary | ICD-10-CM | POA: Diagnosis not present

## 2023-09-30 MED ORDER — ANASTROZOLE 1 MG PO TABS: 1.0000 mg | ORAL_TABLET | Freq: Every day | ORAL | 3 refills | Status: DC

## 2023-09-30 NOTE — Assessment & Plan Note (Addendum)
 Left breast high grade DCIS, s/p lumpectomy + SLNB.  ER 100%+  Pathology results were reviewed with patient.  S/p adjuvant Radiation.  recommend adjuvant endocrine therapy with aromatase inhibitor.  Rationale of using aromatase inhibitor -Arimidex   discussed with patient.  Side effects of Arimidex  including but not limited to hot flash, muscle and joint pain, fatigue, mood swing, vaginal dryness/inching, loss of bone mineral density,hair thinning, heart problem, etc were discussed with patient.  Patient voices understanding and willing to proceed.

## 2023-09-30 NOTE — Telephone Encounter (Signed)
 call from Vertell Barefoot at [employer] wanting to know why patient has to continue part time since she has finished radiation and if still necessary for how much longer and why.  [phone] x [phone]. Radiation oncology nurse Maurice Muse notified and stated she would return call.

## 2023-09-30 NOTE — Progress Notes (Signed)
 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  ER 100%+  Pathology results were reviewed with patient.  S/p adjuvant Radiation.  recommend adjuvant endocrine therapy with aromatase inhibitor.  Rationale of using aromatase inhibitor -Arimidex   discussed with patient.  Side effects of Arimidex  including but not limited to hot flash, muscle and joint pain, fatigue, mood swing, vaginal dryness/inching, loss of bone mineral density,hair thinning, heart problem, etc were discussed with patient.  Patient voices understanding and willing to proceed.      Osteopenia Oct 2024 DEXA - osteopenia.  Recommend calcium  1200mg  and Vitamin D  supplementation.    Orders Placed This Encounter  Procedures   CBC with Differential (Cancer Center Only)    Standing Status:   Future    Expected Date:   12/31/2023    Expiration Date:   03/30/2024   CMP (Cancer Center only)    Standing Status:   Future    Expected Date:   12/31/2023    Expiration Date:   03/30/2024   Follow up a few weeks after radiation.   All questions were answered. The patient knows to call the clinic with any problems, questions or concerns.  Zelphia Cap, MD, PhD [facility] Hematology Oncology 09/30/2023    HISTORY OF PRESENTING ILLNESS:  Laurie Daniels 68 y.o. female presents to establish care for left breast DCIS  I have reviewed her chart and materials related to her cancer extensively and collaborated history with the patient. Summary of oncologic history is as follows: Oncology History  Ductal carcinoma in situ (DCIS) of left breast  06/29/2023 Mammogram   Screening mammogram  In the left breast, calcifications warrant further evaluation. In the right breast, no  findings suspicious for malignancy.   07/01/2023 Mammogram   Bilateral diagnostic mammogram  LEFT breast 5 mm group of amorphous calcification in the upper-outer quadrant is low suspicion for malignancy. Recommend further assessment with stereotactic guided biopsy.   07/09/2023 Initial Diagnosis   Ductal carcinoma in situ (DCIS) of left breast  Patient underwent left breast biopsy.  Pathology showed 1. Breast, left, needle core biopsy, upper outer quadrant (ribbon clip) :   - DUCTAL CARCINOMA IN SITU, HIGH GRADE   - FOCAL MICROINVASION CANNOT BE ENTIRELY RULED OUT   - NECROSIS: PRESENT   - CALCIFICATIONS: PRESENT   - DCIS LENGTH: 0.2 CM     First live birth at age of 29 OCP use: yes History of hysterectomy: no Menopausal status: post History of HRT use: no History of chest radiation: no Number of previous breast biopsies:  no   07/09/2023 Cancer Staging   Staging form: Breast, AJCC 8th Edition - Clinical stage from 07/09/2023: Stage 0 (cTis (DCIS), cN0, cM0, G3) - Signed by Cap Zelphia, MD on 07/09/2023 Stage prefix: Initial diagnosis Histologic grading system: 3 grade system   07/17/2023 Surgery      1. Breast, lumpectomy, left breast mass :       BREAST TISSUE WITH EXTENSIVE INFLAMMATION AND FOREIGN MATERIAL REACTION       CONSISTENT WITH BIOPSY SITE CHANGES.       FOCAL ATYPICAL DUCTAL HYPERPLASIA (0.2 MM).       BIOPSY CLIP (RIBBON SHAPED) IDENTIFIED.       NO EVIDENCE OF RESIDUAL CARCINOMA.  SEE ONCOLOGY TABLE.   2. Lymph node, sentinel, biopsy, left axillary #1 :      ONE LYMPH NODE, NEGATIVE FOR METASTATIC CARCINOMA (0/1).       3. Breast, excision, left breast reexcision, posterior margin stitch inside cavity :      BENIGN BREAST TISSUE.      NEGATIVE FOR ATYPIA OR MALIGNANCY.       4. Breast, excision, left breast anterior margin stitch inside cavity, anterior margin stitch inside cavity :      BENIGN BREAST TISSUE WITH APOCRINE CYSTS.      NEGATIVE FOR ATYPIA OR  MALIGNANCY.       5. Lymph node, sentinel, biopsy, left axillary #2 :      ONE LYMPH NODE, NEGATIVE FOR METASTATIC CARCINOMA (0/1).  ONCOLOGY TABLE      DCIS OF THE BREAST:  Resection ([id number])      Biopsy ([id number])      Procedure: Lumpectomy      Specimen Laterality: Left      Histologic Type: No residual carcinoma in the resection, Ductal carcinoma in      situ in the biopsy      Size of DCIS: 0.2 cm ([id number])      Nuclear Grade: High - grade (biopsy)      Necrosis: Present (biopsy)      Margins: All margins negative for DCIS      Specify Closest Margin (required only if <66mm): NA      Regional Lymph Nodes: Not applicable (no lymph nodes submitted or found)      Number of Lymph Nodes Examined: 0      Number of Sentinel Nodes Examined (if applicable): 0      Number of Lymph Nodes with Macrometastases: NA      Number of Lymph Nodes with Micrometastases): NA      Number of Lymph Nodes with Isolated Tumor Cells (=0.2 mm or =200 cells): NA      Extranodal Extension: NA      Breast Biomarker Testing Performed on Previous Biopsy:      Testing performed on Case Number: [id number]      Estrogen Receptor: 100%, positive, strong staining intensity      Progesterone Receptor: 50%, positive, weak-moderate staining intensity      Pathologic Stage Classification (pTNM, AJCC 8th Edition): pTis, pNx     08/25/2023 - 09/14/2023 Radiation Therapy   Status post left breast adjuvant radiation.    She presents for follow-up.  Status post adjuvant radiation to breast.  Tolerated well.  She has no new complaints.   MEDICAL HISTORY:  Past Medical History:  Diagnosis Date   Anxiety and depression    Atherosclerosis of aorta (HCC)    CTS (carpal tunnel syndrome)    Depressive disorder    Diabetic neuropathy (HCC)    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   Hyperactivity of bladder    Hypertension    Insomnia    Lumbar sprain    Ovarian failure    Rhinitis     Type 2 diabetes mellitus without complication (HCC)    Vaginitis and vulvovaginitis     SURGICAL HISTORY: Past Surgical History:  Procedure Laterality Date   AXILLARY SENTINEL NODE BIOPSY Left 07/17/2023   Procedure: BIOPSY, LYMPH NODE, SENTINEL, AXILLARY;  Surgeon: Rodolph Romano, MD;  Location: [facility];  Service: General;  Laterality: Left;   BREAST BIOPSY Left 07/03/2023   Stereo bx, ribbon clip, path pending  BREAST BIOPSY Left 07/03/2023   MM LT BREAST BX W LOC DEV 1ST LESION IMAGE BX SPEC STEREO GUIDE 07/03/2023 [facility]   BREAST BIOPSY  07/14/2023   MM LT BREAST SAVI/RF TAG 1ST LESION MAMMO GUIDE 07/14/2023 [facility]   BREAST LUMPECTOMY WITH RADIO FREQUENCY LOCALIZER Left 07/17/2023   Procedure: LEFT BREAST PARTIAL MASTECTOMY  WITH RADIO FREQUENCY LOCALIZER;  Surgeon: Rodolph Romano, MD;  Location: [facility];  Service: General;  Laterality: Left;   CATARACT EXTRACTION Right 08/31/2017   CESAREAN SECTION     COLONOSCOPY  06/17/2007   Dr Jinny   COLONOSCOPY WITH PROPOFOL  N/A 07/22/2017   Procedure: COLONOSCOPY WITH PROPOFOL ;  Surgeon: Dessa Reyes ORN, MD;  Location: [facility];  Service: Endoscopy;  Laterality: N/A;   TUBAL LIGATION     vitrectomy Right 07/10/2016   done at [facility]    SOCIAL HISTORY: Social History   Socioeconomic History   Marital status: Divorced    Spouse name: Not on file   Number of children: 2   Years of education: Not on file   Highest education level: 12th grade  Occupational History   Occupation: housekeeping   Tobacco Use   Smoking status: Never   Smokeless tobacco: Never  Vaping Use   Vaping status: Never Used  Substance and Sexual Activity   Alcohol use: No    Alcohol/week: 0.0 standard drinks of alcohol   Drug use: No   Sexual activity: Yes    Partners: Male  Other Topics Concern   Not on file  Social History Narrative   Divorced since 2005, she has a boyfriend but they don't live together      Pt's  son and granddaughter lives with her   Social Drivers of Corporate investment banker Strain: Low Risk  (07/30/2023)   Overall Financial Resource Strain (CARDIA)    Difficulty of Paying Living Expenses: Not hard at all  Recent Concern: Financial Resource Strain - High Risk (07/09/2023)   Received from [facility]   Overall Financial Resource Strain (CARDIA)    Difficulty of Paying Living Expenses: Hard  Food Insecurity: No Food Insecurity (07/30/2023)   Hunger Vital Sign    Worried About Running Out of Food in the Last Year: Never true    Ran Out of Food in the Last Year: Never true  Recent Concern: Food Insecurity - Food Insecurity Present (07/09/2023)   Received from [facility]   Hunger Vital Sign    Within the past 12 months, you worried that your food would run out before you got the money to buy more.: Sometimes true    Within the past 12 months, the food you bought just didn't last and you didn't have money to get more.: Never true  Transportation Needs: No Transportation Needs (07/30/2023)   PRAPARE - Administrator, Civil Service (Medical): No    Lack of Transportation (Non-Medical): No  Physical Activity: Insufficiently Active (07/30/2023)   Exercise Vital Sign    Days of Exercise per Week: 3 days    Minutes of Exercise per Session: 30 min  Stress: No Stress Concern Present (07/30/2023)   [employer] of Occupational Health - Occupational Stress Questionnaire    Feeling of Stress : Not at all  Social Connections: Moderately Isolated (07/30/2023)   Social Connection and Isolation Panel    Frequency of Communication with Friends and Family: More than three times a week    Frequency of Social Gatherings with Friends  and Family: More than three times a week    Attends Religious Services: More than 4 times per year    Active Member of Clubs or Organizations: No    Attends Banker Meetings: Never    Marital Status:  Divorced  Catering manager Violence: Not At Risk (07/30/2023)   Humiliation, Afraid, Rape, and Kick questionnaire    Fear of Current or Ex-Partner: No    Emotionally Abused: No    Physically Abused: No    Sexually Abused: No    FAMILY HISTORY: Family History  Problem Relation Age of Onset   Hypertension Mother    Diabetes Mother    Lung cancer Father    Colon cancer Neg Hx     ALLERGIES:  has no known allergies.  MEDICATIONS:  Current Outpatient Medications  Medication Sig Dispense Refill   anastrozole  (ARIMIDEX ) 1 MG tablet Take 1 tablet (1 mg total) by mouth daily. 30 tablet 3   aspirin 81 MG tablet Take 81 mg by mouth daily.     atorvastatin  (LIPITOR) 40 MG tablet TAKE 1 TABLET EVERY DAY 90 tablet 0   Cholecalciferol (VITAMIN D3) 50 MCG (2000 UT) capsule TAKE 1 CAPSULE EVERY DAY 100 capsule 0   Continuous Glucose Sensor (DEXCOM G7 SENSOR) MISC 1 applicator by Does not apply route as needed. 2 each 5   JANUVIA  100 MG tablet TAKE 1 TABLET EVERY DAY 90 tablet 0   losartan  (COZAAR ) 50 MG tablet Take 1 tablet (50 mg total) by mouth daily. 90 tablet 1   mirtazapine  (REMERON ) 15 MG tablet TAKE 1 TABLET AT BEDTIME 90 tablet 0   omeprazole  (PRILOSEC) 20 MG capsule Take 1 capsule (20 mg total) by mouth daily. 90 capsule 1   sertraline  (ZOLOFT ) 50 MG tablet Take 1 tablet (50 mg total) by mouth daily. 90 tablet 1   vitamin B-12 (CYANOCOBALAMIN ) 100 MCG tablet Take 100 mcg by mouth daily.     No current facility-administered medications for this visit.    Review of Systems  Constitutional:  Negative for appetite change, chills, fatigue and fever.  HENT:   Negative for hearing loss and voice change.   Eyes:  Negative for eye problems.  Respiratory:  Negative for chest tightness and cough.   Cardiovascular:  Negative for chest pain.  Gastrointestinal:  Negative for abdominal distention, abdominal pain and blood in stool.  Endocrine: Negative for hot flashes.  Genitourinary:  Negative  for difficulty urinating and frequency.   Musculoskeletal:  Negative for arthralgias.  Skin:  Negative for itching and rash.  Neurological:  Negative for extremity weakness.  Hematological:  Negative for adenopathy.  Psychiatric/Behavioral:  Negative for confusion.      PHYSICAL EXAMINATION: ECOG PERFORMANCE STATUS: 0 - Asymptomatic  Vitals:   09/30/23 1418  BP: 124/85  Pulse: 65  Resp: 18  Temp: 98.1 F (36.7 C)  SpO2: 95%   Filed Weights   09/30/23 1418  Weight: 149 lb 8 oz (67.8 kg)    Physical Exam Constitutional:      General: She is not in acute distress.    Appearance: She is not diaphoretic.  HENT:     Head: Normocephalic and atraumatic.     Nose: Nose normal.     Mouth/Throat:     Pharynx: No oropharyngeal exudate.  Eyes:     General: No scleral icterus.    Pupils: Pupils are equal, round, and reactive to light.  Cardiovascular:     Rate and Rhythm: Normal  rate and regular rhythm.     Heart sounds: No murmur heard. Pulmonary:     Effort: Pulmonary effort is normal. No respiratory distress.     Breath sounds: No rales.  Chest:     Chest wall: No tenderness.  Abdominal:     General: There is no distension.     Palpations: Abdomen is soft.     Tenderness: There is no abdominal tenderness.  Musculoskeletal:        General: Normal range of motion.     Cervical back: Normal range of motion and neck supple.  Skin:    General: Skin is warm and dry.     Findings: No erythema.  Neurological:     Mental Status: She is alert and oriented to person, place, and time.     Cranial Nerves: No cranial nerve deficit.     Motor: No abnormal muscle tone.     Coordination: Coordination normal.  Psychiatric:        Mood and Affect: Affect normal.     LABORATORY DATA:  I have reviewed the data as listed    Latest Ref Rng & Units 09/07/2023    1:52 PM 08/24/2023    1:55 PM 05/12/2023    3:33 PM  CBC  WBC 4.0 - 10.5 K/uL 7.6  10.4  8.4   Hemoglobin 12.0 - 15.0  g/dL 88.3  88.4  87.7   Hematocrit 36.0 - 46.0 % 36.5  36.4  38.0   Platelets 150 - 400 K/uL 231  204  239       Latest Ref Rng & Units 05/12/2023    3:33 PM 02/11/2022    3:59 PM 05/10/2021    4:25 PM  CMP  Glucose 65 - 99 mg/dL 892  876    BUN 7 - 25 mg/dL 17  19    Creatinine 9.49 - 1.05 mg/dL 9.18  9.21    Sodium 864 - 146 mmol/L 141  142    Potassium 3.5 - 5.3 mmol/L 4.3  3.9    Chloride 98 - 110 mmol/L 103  105    CO2 20 - 32 mmol/L 32  27    Calcium  8.6 - 10.4 mg/dL 89.9  89.7    89.7  9.9   Total Protein 6.1 - 8.1 g/dL 7.4  7.4    Total Bilirubin 0.2 - 1.2 mg/dL 0.4  0.3    AST 10 - 35 U/L 14  13    ALT 6 - 29 U/L 14  14       RADIOGRAPHIC STUDIES: I have personally reviewed the radiological images as listed and agreed with the findings in the report. No results found.

## 2023-09-30 NOTE — Assessment & Plan Note (Signed)
 Oct 2024 DEXA - osteopenia.  Recommend calcium  1200mg  and Vitamin D  supplementation.

## 2023-10-01 ENCOUNTER — Encounter: Payer: Self-pay | Admitting: *Deleted

## 2023-10-02 ENCOUNTER — Encounter: Payer: Self-pay | Admitting: *Deleted

## 2023-10-19 ENCOUNTER — Ambulatory Visit
Admission: RE | Admit: 2023-10-19 | Discharge: 2023-10-19 | Disposition: A | Discharge status: Home / Self Care | Source: Ambulatory Visit | Attending: Radiation Oncology | Admitting: Radiation Oncology

## 2023-10-19 ENCOUNTER — Encounter: Payer: Self-pay | Admitting: Radiation Oncology

## 2023-10-19 VITALS — BP 143/72 | HR 73 | Temp 97.4°F | Resp 16

## 2023-10-19 DIAGNOSIS — Z170 Estrogen receptor positive status [ER+]: Secondary | ICD-10-CM | POA: Insufficient documentation

## 2023-10-19 DIAGNOSIS — C50912 Malignant neoplasm of unspecified site of left female breast: Secondary | ICD-10-CM | POA: Insufficient documentation

## 2023-10-19 NOTE — Progress Notes (Signed)
 Radiation Oncology Follow up Note  Name: Laurie Daniels   Date:   10/19/2023 MRN:  [id number] DOB: 15-Aug-1955    This 68 y.o. female presents to the clinic today for 1 month follow-up status post whole breast radiation to her left breast for ER positive ductal carcinoma site 2.  REFERRING PROVIDER: [name], [name], MD  HPI: Patient is a 68 year old female now out 1 month having completed whole breast radiation to her left breast for ER positive ductal carcinoma in situ.  Seen today in routine follow-up she is doing well.  She specifically denies breast tenderness cough or bone pain.  She still has some significant hyperpigmentation of the skin although the cosmetic result is excellent..  She has been started on Arimidex  tolerating it well without side effects.  COMPLICATIONS OF TREATMENT: none  FOLLOW UP COMPLIANCE: keeps appointments   PHYSICAL EXAM:  BP (!) 143/72   Pulse 73   Temp (!) 97.4 F (36.3 C) (Tympanic)   Resp 16  Lungs are clear to A&P cardiac examination essentially unremarkable with regular rate and rhythm. No dominant mass or nodularity is noted in either breast in 2 positions examined. Incision is well-healed. No axillary or supraclavicular adenopathy is appreciated. Cosmetic result is excellent.  Still some persistent hyperpigmentation of the skin.  Well-developed well-nourished patient in NAD. HEENT reveals PERLA, EOMI, discs not visualized.  Oral cavity is clear. No oral mucosal lesions are identified. Neck is clear without evidence of cervical or supraclavicular adenopathy. Lungs are clear to A&P. Cardiac examination is essentially unremarkable with regular rate and rhythm without murmur rub or thrill. Abdomen is benign with no organomegaly or masses noted. Motor sensory and DTR levels are equal and symmetric in the upper and lower extremities. Cranial nerves II through XII are grossly intact. Proprioception is intact. No peripheral adenopathy or edema is identified. No  motor or sensory levels are noted. Crude visual fields are within normal range.  RADIOLOGY RESULTS: No current films to review  PLAN: The present time patient is doing well 1 month out from whole breast radiation with very low side effect profile.  I have asked to see her back in 6 months for follow-up.  She continues on Arimidex  without side effect.  Patient knows to call with any concerns.  I would like to take this opportunity to thank you for allowing me to participate in the care of your patient.SABRA Marcey Penton, MD

## 2023-10-22 ENCOUNTER — Inpatient Hospital Stay: Attending: Oncology | Admitting: *Deleted

## 2023-10-22 DIAGNOSIS — D0512 Intraductal carcinoma in situ of left breast: Secondary | ICD-10-CM

## 2023-10-22 DIAGNOSIS — Z9189 Other specified personal risk factors, not elsewhere classified: Secondary | ICD-10-CM

## 2023-10-22 NOTE — Progress Notes (Signed)
 SUBJECTIVE: Pt returns for her 3 month L-Dex screen.    PAIN:  Are you having pain? No   SOZO SCREENING: Patient was assessed today using the SOZO machine to determine the lymphedema index score. This was compared to her baseline score. It was determined that she is within the recommended range when compared to her baseline and no further action is needed at this time. She will continue SOZO screenings. These are done every 3 months for 2 years post operatively followed by every 6 months for 2 years, and then annually.     L-DEX FLOWSHEETS                L-DEX LYMPHEDEMA SCREENING    Measurement Type Unilateral     L-DEX MEASUREMENT EXTREMITY Upper Extremity     POSITION  Standing     DOMINANT SIDE Right     At Risk Side Left     BASELINE SCORE (UNILATERAL) 1.5    L-DEX SCORE (UNILATERAL) 6.0    VALUE CHANGE (UNILAT) 4.5

## 2023-10-23 ENCOUNTER — Other Ambulatory Visit: Payer: Self-pay | Admitting: Family Medicine

## 2023-10-23 ENCOUNTER — Ambulatory Visit: Admitting: Family Medicine

## 2023-10-23 DIAGNOSIS — F5101 Primary insomnia: Secondary | ICD-10-CM

## 2023-10-26 ENCOUNTER — Ambulatory Visit (INDEPENDENT_AMBULATORY_CARE_PROVIDER_SITE_OTHER): Admitting: Family Medicine

## 2023-10-26 ENCOUNTER — Encounter: Payer: Self-pay | Admitting: Family Medicine

## 2023-10-26 VITALS — BP 144/78 | HR 95 | Resp 16 | Ht 63.0 in | Wt 149.5 lb

## 2023-10-26 DIAGNOSIS — E213 Hyperparathyroidism, unspecified: Secondary | ICD-10-CM

## 2023-10-26 DIAGNOSIS — I700 Atherosclerosis of aorta: Secondary | ICD-10-CM | POA: Diagnosis not present

## 2023-10-26 DIAGNOSIS — E1129 Type 2 diabetes mellitus with other diabetic kidney complication: Secondary | ICD-10-CM

## 2023-10-26 DIAGNOSIS — F330 Major depressive disorder, recurrent, mild: Secondary | ICD-10-CM | POA: Diagnosis not present

## 2023-10-26 DIAGNOSIS — K219 Gastro-esophageal reflux disease without esophagitis: Secondary | ICD-10-CM

## 2023-10-26 DIAGNOSIS — E559 Vitamin D deficiency, unspecified: Secondary | ICD-10-CM

## 2023-10-26 DIAGNOSIS — D0512 Intraductal carcinoma in situ of left breast: Secondary | ICD-10-CM

## 2023-10-26 DIAGNOSIS — R809 Proteinuria, unspecified: Secondary | ICD-10-CM

## 2023-10-26 DIAGNOSIS — F5101 Primary insomnia: Secondary | ICD-10-CM

## 2023-10-26 DIAGNOSIS — Z9012 Acquired absence of left breast and nipple: Secondary | ICD-10-CM | POA: Diagnosis not present

## 2023-10-26 DIAGNOSIS — E1169 Type 2 diabetes mellitus with other specified complication: Secondary | ICD-10-CM | POA: Diagnosis not present

## 2023-10-26 DIAGNOSIS — E785 Hyperlipidemia, unspecified: Secondary | ICD-10-CM

## 2023-10-26 LAB — POCT GLYCOSYLATED HEMOGLOBIN (HGB A1C): Hemoglobin A1C: 6.8 % — AB (ref 4.0–5.6)

## 2023-10-26 MED ORDER — ATORVASTATIN CALCIUM 40 MG PO TABS: 40.0000 mg | ORAL_TABLET | Freq: Every day | ORAL | 0 refills | Status: DC

## 2023-10-26 MED ORDER — MIRTAZAPINE 15 MG PO TABS: 15.0000 mg | ORAL_TABLET | Freq: Every day | ORAL | 0 refills | Status: DC

## 2023-10-26 MED ORDER — LOSARTAN POTASSIUM 100 MG PO TABS: 100.0000 mg | ORAL_TABLET | Freq: Every day | ORAL | 0 refills | Status: DC

## 2023-10-26 NOTE — Progress Notes (Signed)
 Name: Laurie Daniels   MRN: [id number]    DOB: [birth year]   Date:10/26/2023       Progress Note  Subjective  Chief Complaint  Chief Complaint  Patient presents with   Medical Management of Chronic Issues    DM exam req from [facility]   Discussed the use of AI scribe software for clinical note transcription with the patient, who gave verbal consent to proceed.  History of Present Illness Laurie Daniels is a 68 year old female who presents for a routine follow-up.  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 She experienced pain in the axilla after radiation, which has since resolved. She is currently taking Arimidex  (anastrozole ) and experiences hot flashes and fatigue as side effects. Her next mammogram is scheduled for April 2026.  She has a history of type 2 diabetes, previously managed with Januvia , which she has stopped a few weeks ago due to cost . Her A1c improved from 7.2% in December to 6.7% recently. She is not currently on any diabetes medication and is managing her condition through diet and exercise. No current issues with excessive hunger, thirst, or urination related to diabetes.  She has hypertension, managed with losartan  50mg  , which she takes daily without side effects. She also takes atorvastatin  for dyslipidemia and reports no side effects.  She has  hyperparathyroidism , but normal calcium  levels. She is under the care of an endocrinologist for this condition and advised to replaced vitamin d    She experiences gastroesophageal reflux disease, managed with omeprazole , and reports no issues with constipation, using over-the-counter remedies for gas.  She has a history of depression, currently managed with sertraline  and mirtazapine , which she continues to take. She reports feeling 'up now' and attributes her improved mood to  the medication. No chest pain, palpitations, or constipation. Reports hot flashes, fatigue, and occasional nausea due to Arimidex     Patient Active Problem List   Diagnosis Date Noted   Atypical ductal hyperplasia of left breast 08/05/2023   Osteopenia 08/05/2023   Ductal carcinoma in situ (DCIS) of left breast 07/09/2023   Osteoarthritis of right knee 10/23/2021   Pain in joint of right knee 10/23/2021   Hyperparathyroidism (HCC) 05/10/2021   Dyslipidemia associated with type 2 diabetes mellitus (HCC) 05/10/2021   Chronic constipation 01/30/2016   Allergic rhinitis 08/13/2015   Carpal tunnel syndrome 08/13/2015   Atherosclerosis of aorta (HCC) 08/13/2015   Nocturia 08/13/2015   Migraine without aura and responsive to treatment 08/13/2015   Thyroid  goiter 08/13/2015   Diabetes mellitus with microalbuminuria (HCC) 03/05/2015   Insomnia 10/12/2014   Hyperlipidemia 10/12/2014    Past Surgical History:  Procedure Laterality Date   AXILLARY SENTINEL NODE BIOPSY Left 07/17/2023   Procedure: BIOPSY, LYMPH NODE, SENTINEL, AXILLARY;  Surgeon: Rodolph Romano, MD;  Location: [facility];  Service: General;  Laterality: Left;   BREAST BIOPSY Left 07/03/2023   Stereo bx, ribbon clip, path pending   BREAST BIOPSY Left 07/03/2023   MM LT BREAST BX W LOC DEV 1ST LESION IMAGE BX SPEC STEREO GUIDE 07/03/2023 [facility]   BREAST BIOPSY  07/14/2023   MM LT BREAST SAVI/RF TAG 1ST LESION MAMMO GUIDE 07/14/2023 [facility]   BREAST LUMPECTOMY WITH RADIO FREQUENCY LOCALIZER Left 07/17/2023   Procedure: LEFT BREAST PARTIAL MASTECTOMY  WITH RADIO FREQUENCY LOCALIZER;  Surgeon: Rodolph Romano, MD;  Location: [facility];  Service: General;  Laterality: Left;   CATARACT EXTRACTION Right 08/31/2017   CESAREAN SECTION     COLONOSCOPY  06/17/2007   Dr Jinny   COLONOSCOPY WITH PROPOFOL  N/A 07/22/2017   Procedure: COLONOSCOPY WITH PROPOFOL ;  Surgeon: Dessa Reyes ORN, MD;  Location: [facility]  ENDOSCOPY;  Service: Endoscopy;  Laterality: N/A;   TUBAL LIGATION     vitrectomy Right 07/10/2016   done at [facility] History  Problem Relation Age of Onset   Hypertension Mother    Diabetes Mother    Lung cancer Father    Colon cancer Neg Hx     Social History   Tobacco Use   Smoking status: Never   Smokeless tobacco: Never  Substance Use Topics   Alcohol use: No    Alcohol/week: 0.0 standard drinks of alcohol     Current Outpatient Medications:    anastrozole  (ARIMIDEX ) 1 MG tablet, Take 1 tablet (1 mg total) by mouth daily., Disp: 30 tablet, Rfl: 3   aspirin 81 MG tablet, Take 81 mg by mouth daily., Disp: , Rfl:    atorvastatin  (LIPITOR) 40 MG tablet, TAKE 1 TABLET EVERY DAY, Disp: 90 tablet, Rfl: 0   Cholecalciferol (VITAMIN D3) 50 MCG (2000 UT) capsule, TAKE 1 CAPSULE EVERY DAY, Disp: 100 capsule, Rfl: 0   Continuous Glucose Sensor (DEXCOM G7 SENSOR) MISC, 1 applicator by Does not apply route as needed., Disp: 2 each, Rfl: 5   losartan  (COZAAR ) 50 MG tablet, Take 1 tablet (50 mg total) by mouth daily., Disp: 90 tablet, Rfl: 1   mirtazapine  (REMERON ) 15 MG tablet, TAKE 1 TABLET AT BEDTIME, Disp: 90 tablet, Rfl: 0   omeprazole  (PRILOSEC) 20 MG capsule, Take 1 capsule (20 mg total) by mouth daily., Disp: 90 capsule, Rfl: 1   sertraline  (ZOLOFT ) 50 MG tablet, Take 1 tablet (50 mg total) by mouth daily., Disp: 90 tablet, Rfl: 1   vitamin B-12 (CYANOCOBALAMIN ) 100 MCG tablet, Take 100 mcg by mouth daily., Disp: , Rfl:   No Known Allergies  I personally reviewed active problem list, medication list, allergies, family history with the patient/caregiver today.   ROS  Ten systems reviewed and is negative except as mentioned in HPI    Objective Physical Exam  CONSTITUTIONAL: Patient appears well-developed and well-nourished. No distress. HEENT: Head atraumatic, normocephalic, neck supple. CARDIOVASCULAR: Normal rate, regular rhythm and normal heart sounds. No murmur  heard. No edema. PULMONARY: Effort normal and breath sounds normal. Lungs clear to auscultation. No respiratory distress. MUSCULOSKELETAL: Normal gait. Without gross motor or sensory deficit. PSYCHIATRIC: Patient has a normal mood and affect. Behavior is normal. Judgment and thought content normal.  Vitals:   10/26/23 1528  BP: (!) 144/78  Pulse: 95  Resp: 16  SpO2: 95%  Weight: 149 lb 8 oz (67.8 kg)  Height: 5' 3 (1.6 m)    Body mass index is 26.48 kg/m.  Recent Results (from the past 2160 hours)  Rad Onc Aria Session Summary     Status: None   Collection Time: 08/18/23  2:22 PM  Result Value Ref Range   Course ID C1_Breast    Course Start Date 08/10/2023    Session Number 1    Course First Treatment Date 08/18/2023  2:20 PM    Course Last Treatment Date 08/18/2023  2:21 PM    Course Elapsed Days 0    Reference Point ID Breast_L    Reference Point Dosage Given to Date 2.66 Gy   Reference Point  Session Dosage Given 2.66 Gy   Plan ID Breast_L    Plan Fractions Treated to Date 1    Plan Total Fractions Prescribed 16    Plan Prescribed Dose Per Fraction 2.66 Gy   Plan Total Prescribed Dose 42.560000 Gy   Plan Primary Reference Point Breast_L   Rad Onc Aria Session Summary     Status: None   Collection Time: 08/19/23  2:18 PM  Result Value Ref Range   Course ID C1_Breast    Course Start Date 08/10/2023    Session Number 2    Course First Treatment Date 08/18/2023  2:20 PM    Course Last Treatment Date 08/19/2023  2:17 PM    Course Elapsed Days 1    Reference Point ID Breast_L    Reference Point Dosage Given to Date 5.32 Gy   Reference Point Session Dosage Given 2.66 Gy   Plan ID Breast_L    Plan Fractions Treated to Date 2    Plan Total Fractions Prescribed 16    Plan Prescribed Dose Per Fraction 2.66 Gy   Plan Total Prescribed Dose 42.560000 Gy   Plan Primary Reference Point Breast_L   Rad Onc Aria Session Summary     Status: None   Collection Time: 08/20/23  1:49 PM   Result Value Ref Range   Course ID C1_Breast    Course Start Date 08/10/2023    Session Number 3    Course First Treatment Date 08/18/2023  2:20 PM    Course Last Treatment Date 08/20/2023  1:48 PM    Course Elapsed Days 2    Reference Point ID Breast_L    Reference Point Dosage Given to Date 7.98 Gy   Reference Point Session Dosage Given 2.66 Gy   Plan ID Breast_L    Plan Fractions Treated to Date 3    Plan Total Fractions Prescribed 16    Plan Prescribed Dose Per Fraction 2.66 Gy   Plan Total Prescribed Dose 42.560000 Gy   Plan Primary Reference Point Breast_L   Rad Onc Aria Session Summary     Status: None   Collection Time: 08/21/23 11:22 AM  Result Value Ref Range   Course ID C1_Breast    Course Start Date 08/10/2023    Session Number 4    Course First Treatment Date 08/18/2023  2:20 PM    Course Last Treatment Date 08/21/2023 11:21 AM    Course Elapsed Days 3    Reference Point ID Breast_L    Reference Point Dosage Given to Date 10.64 Gy   Reference Point Session Dosage Given 2.66 Gy   Plan ID Breast_L    Plan Fractions Treated to Date 4    Plan Total Fractions Prescribed 16    Plan Prescribed Dose Per Fraction 2.66 Gy   Plan Total Prescribed Dose 42.560000 Gy   Plan Primary Reference Point Breast_L   CBC (Cancer Center Only)     Status: Abnormal   Collection Time: 08/24/23  1:55 PM  Result Value Ref Range   WBC Count 10.4 4.0 - 10.5 K/uL   RBC 4.01 3.87 - 5.11 MIL/uL   Hemoglobin 11.5 (L) 12.0 - 15.0 g/dL   HCT 63.5 63.9 - 53.9 %   MCV 90.8 80.0 - 100.0 fL   MCH 28.7 26.0 - 34.0 pg   MCHC 31.6 30.0 - 36.0 g/dL   RDW 86.0 88.4 - 84.4 %   Platelet Count 204 150 - 400 K/uL   nRBC 0.0 0.0 - 0.2 %  Comment: Performed at [facility], [address] [place], [place] [address]  [facility] Session Summary     Status: None   Collection Time: 08/24/23  2:10 PM  Result Value Ref Range   Course ID C1_Breast    Course Start Date 08/10/2023    Session Number 5     Course First Treatment Date 08/18/2023  2:20 PM    Course Last Treatment Date 08/24/2023  2:09 PM    Course Elapsed Days 6    Reference Point ID Breast_L    Reference Point Dosage Given to Date 13.3 Gy   Reference Point Session Dosage Given 2.66 Gy   Plan ID Breast_L    Plan Fractions Treated to Date 5    Plan Total Fractions Prescribed 16    Plan Prescribed Dose Per Fraction 2.66 Gy   Plan Total Prescribed Dose 42.560000 Gy   Plan Primary Reference Point Breast_L   Rad Onc Aria Session Summary     Status: None   Collection Time: 08/25/23  2:06 PM  Result Value Ref Range   Course ID C1_Breast    Course Start Date 08/10/2023    Session Number 6    Course First Treatment Date 08/18/2023  2:20 PM    Course Last Treatment Date 08/25/2023  2:05 PM    Course Elapsed Days 7    Reference Point ID Breast_L    Reference Point Dosage Given to Date 15.96 Gy   Reference Point Session Dosage Given 2.66 Gy   Plan ID Breast_L    Plan Fractions Treated to Date 6    Plan Total Fractions Prescribed 16    Plan Prescribed Dose Per Fraction 2.66 Gy   Plan Total Prescribed Dose 42.560000 Gy   Plan Primary Reference Point Breast_L   Rad Onc Aria Session Summary     Status: None   Collection Time: 08/26/23  2:08 PM  Result Value Ref Range   Course ID C1_Breast    Course Start Date 08/10/2023    Session Number 7    Course First Treatment Date 08/18/2023  2:20 PM    Course Last Treatment Date 08/26/2023  2:07 PM    Course Elapsed Days 8    Reference Point ID Breast_L    Reference Point Dosage Given to Date 18.62 Gy   Reference Point Session Dosage Given 2.66 Gy   Plan ID Breast_L    Plan Fractions Treated to Date 7    Plan Total Fractions Prescribed 16    Plan Prescribed Dose Per Fraction 2.66 Gy   Plan Total Prescribed Dose 42.560000 Gy   Plan Primary Reference Point Breast_L   Rad Onc Aria Session Summary     Status: None   Collection Time: 08/27/23  2:02 PM  Result Value Ref Range   Course ID  C1_Breast    Course Start Date 08/10/2023    Session Number 8    Course First Treatment Date 08/18/2023  2:20 PM    Course Last Treatment Date 08/27/2023  2:01 PM    Course Elapsed Days 9    Reference Point ID Breast_L    Reference Point Dosage Given to Date 21.28 Gy   Reference Point Session Dosage Given 2.66 Gy   Plan ID Breast_L    Plan Fractions Treated to Date 8    Plan Total Fractions Prescribed 16    Plan Prescribed Dose Per Fraction 2.66 Gy   Plan Total Prescribed Dose 42.560000 Gy   Plan Primary Reference  Point Breast_L   Rad Onc Aria Session Summary     Status: None   Collection Time: 08/28/23 11:50 AM  Result Value Ref Range   Course ID C1_Breast    Course Start Date 08/10/2023    Session Number 9    Course First Treatment Date 08/18/2023  2:20 PM    Course Last Treatment Date 08/28/2023 11:49 AM    Course Elapsed Days 10    Reference Point ID Breast_L    Reference Point Dosage Given to Date 23.94 Gy   Reference Point Session Dosage Given 2.66 Gy   Plan ID Breast_L    Plan Fractions Treated to Date 9    Plan Total Fractions Prescribed 16    Plan Prescribed Dose Per Fraction 2.66 Gy   Plan Total Prescribed Dose 42.560000 Gy   Plan Primary Reference Point Breast_L   Rad Onc Aria Session Summary     Status: None   Collection Time: 08/31/23  2:22 PM  Result Value Ref Range   Course ID C1_Breast    Course Start Date 08/10/2023    Session Number 10    Course First Treatment Date 08/18/2023  2:20 PM    Course Last Treatment Date 08/31/2023  2:21 PM    Course Elapsed Days 13    Reference Point ID Breast_L    Reference Point Dosage Given to Date 26.6 Gy   Reference Point Session Dosage Given 2.66 Gy   Plan ID Breast_L    Plan Fractions Treated to Date 10    Plan Total Fractions Prescribed 16    Plan Prescribed Dose Per Fraction 2.66 Gy   Plan Total Prescribed Dose 42.560000 Gy   Plan Primary Reference Point Breast_L   Rad Onc Aria Session Summary     Status: None    Collection Time: 09/01/23  2:08 PM  Result Value Ref Range   Course ID C1_Breast    Course Start Date 08/10/2023    Session Number 11    Course First Treatment Date 08/18/2023  2:20 PM    Course Last Treatment Date 09/01/2023  2:07 PM    Course Elapsed Days 14    Reference Point ID Breast_L    Reference Point Dosage Given to Date 29.26 Gy   Reference Point Session Dosage Given 2.66 Gy   Plan ID Breast_L    Plan Fractions Treated to Date 11    Plan Total Fractions Prescribed 16    Plan Prescribed Dose Per Fraction 2.66 Gy   Plan Total Prescribed Dose 42.560000 Gy   Plan Primary Reference Point Breast_L   Rad Onc Aria Session Summary     Status: None   Collection Time: 09/02/23  2:00 PM  Result Value Ref Range   Course ID C1_Breast    Course Start Date 08/10/2023    Session Number 12    Course First Treatment Date 08/18/2023  2:20 PM    Course Last Treatment Date 09/02/2023  1:59 PM    Course Elapsed Days 15    Reference Point ID Breast_L    Reference Point Dosage Given to Date 31.92 Gy   Reference Point Session Dosage Given 2.66 Gy   Plan ID Breast_L    Plan Fractions Treated to Date 12    Plan Total Fractions Prescribed 16    Plan Prescribed Dose Per Fraction 2.66 Gy   Plan Total Prescribed Dose 42.560000 Gy   Plan Primary Reference Point Breast_L   Rad Onc Aria Session Summary  Status: None   Collection Time: 09/03/23  1:59 PM  Result Value Ref Range   Course ID C1_Breast    Course Start Date 08/10/2023    Session Number 13    Course First Treatment Date 08/18/2023  2:20 PM    Course Last Treatment Date 09/03/2023  1:58 PM    Course Elapsed Days 16    Reference Point ID Breast_L    Reference Point Dosage Given to Date 34.58 Gy   Reference Point Session Dosage Given 2.66 Gy   Plan ID Breast_L    Plan Fractions Treated to Date 13    Plan Total Fractions Prescribed 16    Plan Prescribed Dose Per Fraction 2.66 Gy   Plan Total Prescribed Dose 42.560000 Gy   Plan Primary Reference  Point Breast_L   CBC (Cancer Center Only)     Status: Abnormal   Collection Time: 09/07/23  1:52 PM  Result Value Ref Range   WBC Count 7.6 4.0 - 10.5 K/uL   RBC 4.02 3.87 - 5.11 MIL/uL   Hemoglobin 11.6 (L) 12.0 - 15.0 g/dL   HCT 63.4 63.9 - 53.9 %   MCV 90.8 80.0 - 100.0 fL   MCH 28.9 26.0 - 34.0 pg   MCHC 31.8 30.0 - 36.0 g/dL   RDW 86.1 88.4 - 84.4 %   Platelet Count 231 150 - 400 K/uL   nRBC 0.0 0.0 - 0.2 %    Comment: Performed at [facility], [address] [place], [place] [address]  [facility] Session Summary     Status: None   Collection Time: 09/07/23  2:07 PM  Result Value Ref Range   Course ID C1_Breast    Course Start Date 08/10/2023    Session Number 14    Course First Treatment Date 08/18/2023  2:20 PM    Course Last Treatment Date 09/07/2023  2:06 PM    Course Elapsed Days 20    Reference Point ID Breast_L    Reference Point Dosage Given to Date 37.24 Gy   Reference Point Session Dosage Given 2.66 Gy   Plan ID Breast_L    Plan Fractions Treated to Date 14    Plan Total Fractions Prescribed 16    Plan Prescribed Dose Per Fraction 2.66 Gy   Plan Total Prescribed Dose 42.560000 Gy   Plan Primary Reference Point Breast_L   Rad Onc Aria Session Summary     Status: None   Collection Time: 09/08/23  2:09 PM  Result Value Ref Range   Course ID C1_Breast    Course Start Date 08/10/2023    Session Number 15    Course First Treatment Date 08/18/2023  2:20 PM    Course Last Treatment Date 09/08/2023  2:08 PM    Course Elapsed Days 21    Reference Point ID Breast_L    Reference Point Dosage Given to Date 39.9 Gy   Reference Point Session Dosage Given 2.66 Gy   Plan ID Breast_L    Plan Fractions Treated to Date 15    Plan Total Fractions Prescribed 16    Plan Prescribed Dose Per Fraction 2.66 Gy   Plan Total Prescribed Dose 42.560000 Gy   Plan Primary Reference Point Breast_L   Rad Onc Aria Session Summary     Status: None   Collection Time: 09/09/23  2:12 PM   Result Value Ref Range   Course ID C1_Breast    Course Start Date 08/10/2023    Session Number 16  Course First Treatment Date 08/18/2023  2:20 PM    Course Last Treatment Date 09/09/2023  2:11 PM    Course Elapsed Days 22    Reference Point ID Breast_L    Reference Point Dosage Given to Date 42.56 Gy   Reference Point Session Dosage Given 2.66 Gy   Plan ID Breast_L    Plan Fractions Treated to Date 16    Plan Total Fractions Prescribed 16    Plan Prescribed Dose Per Fraction 2.66 Gy   Plan Total Prescribed Dose 42.560000 Gy   Plan Primary Reference Point Breast_L   Rad Onc Aria Session Summary     Status: None   Collection Time: 09/10/23  2:02 PM  Result Value Ref Range   Course ID C1_Breast    Course Start Date 08/10/2023    Session Number 17    Course First Treatment Date 08/18/2023  2:20 PM    Course Last Treatment Date 09/10/2023  2:02 PM    Course Elapsed Days 23    Reference Point ID Breast_L_Bst    Reference Point Dosage Given to Date 2 Gy   Reference Point Session Dosage Given 2 Gy   Plan ID Breast_L_Bst    Plan Name Breast_Lt_Bst    Plan Fractions Treated to Date 1    Plan Total Fractions Prescribed 5    Plan Prescribed Dose Per Fraction 2 Gy   Plan Total Prescribed Dose 10.000000 Gy   Plan Primary Reference Point Breast_L_Bst   Rad Onc Aria Session Summary     Status: None   Collection Time: 09/11/23 11:50 AM  Result Value Ref Range   Course ID C1_Breast    Course Start Date 08/10/2023    Session Number 18    Course First Treatment Date 08/18/2023  2:20 PM    Course Last Treatment Date 09/11/2023 11:49 AM    Course Elapsed Days 24    Reference Point ID Breast_L_Bst    Reference Point Dosage Given to Date 4 Gy   Reference Point Session Dosage Given 2 Gy   Plan ID Breast_L_Bst    Plan Name Breast_Lt_Bst    Plan Fractions Treated to Date 2    Plan Total Fractions Prescribed 5    Plan Prescribed Dose Per Fraction 2 Gy   Plan Total Prescribed Dose 10.000000 Gy   Plan  Primary Reference Point Breast_L_Bst   Rad Onc Aria Session Summary     Status: None   Collection Time: 09/14/23  2:00 PM  Result Value Ref Range   Course ID C1_Breast    Course Start Date 08/10/2023    Session Number 19    Course First Treatment Date 08/18/2023  2:20 PM    Course Last Treatment Date 09/14/2023  1:59 PM    Course Elapsed Days 27    Reference Point ID Breast_L_Bst    Reference Point Dosage Given to Date 6 Gy   Reference Point Session Dosage Given 2 Gy   Plan ID Breast_L_Bst    Plan Name Breast_Lt_Bst    Plan Fractions Treated to Date 3    Plan Total Fractions Prescribed 5    Plan Prescribed Dose Per Fraction 2 Gy   Plan Total Prescribed Dose 10.000000 Gy   Plan Primary Reference Point Breast_L_Bst   Rad Onc Aria Session Summary     Status: None   Collection Time: 09/15/23  2:12 PM  Result Value Ref Range   Course ID C1_Breast    Course Start Date 08/10/2023    Session  Number 20    Course First Treatment Date 08/18/2023  2:20 PM    Course Last Treatment Date 09/15/2023  2:11 PM    Course Elapsed Days 28    Reference Point ID Breast_L_Bst    Reference Point Dosage Given to Date 8 Gy   Reference Point Session Dosage Given 2 Gy   Plan ID Breast_L_Bst    Plan Name Breast_Lt_Bst    Plan Fractions Treated to Date 4    Plan Total Fractions Prescribed 5    Plan Prescribed Dose Per Fraction 2 Gy   Plan Total Prescribed Dose 10.000000 Gy   Plan Primary Reference Point Breast_L_Bst   Rad Onc Aria Session Summary     Status: None   Collection Time: 09/16/23  2:19 PM  Result Value Ref Range   Course ID C1_Breast    Course Start Date 08/10/2023    Session Number 21    Course First Treatment Date 08/18/2023  2:20 PM    Course Last Treatment Date 09/16/2023  2:18 PM    Course Elapsed Days 29    Reference Point ID Breast_L_Bst    Reference Point Dosage Given to Date 10 Gy   Reference Point Session Dosage Given 2 Gy   Plan ID Breast_L_Bst    Plan Name Breast_Lt_Bst    Plan  Fractions Treated to Date 5    Plan Total Fractions Prescribed 5    Plan Prescribed Dose Per Fraction 2 Gy   Plan Total Prescribed Dose 10.000000 Gy   Plan Primary Reference Point Breast_L_Bst   POCT glycosylated hemoglobin (Hb A1C)     Status: Abnormal   Collection Time: 10/26/23  3:34 PM  Result Value Ref Range   Hemoglobin A1C 6.8 (A) 4.0 - 5.6 %   HbA1c POC (<> result, manual entry)     HbA1c, POC (prediabetic range)     HbA1c, POC (controlled diabetic range)      Diabetic Foot Exam:     PHQ2/9:    10/26/2023    3:23 PM 10/19/2023    2:09 PM 07/30/2023   12:53 PM 06/22/2023    4:09 PM 02/17/2023    3:06 PM  Depression screen PHQ 2/9  Decreased Interest 0 0 0 0 1  Down, Depressed, Hopeless 0 0 0 2 1  PHQ - 2 Score 0 0 0 2 2  Altered sleeping   0 0 2  Tired, decreased energy   0 3 3  Change in appetite   0 3 1  Feeling bad or failure about yourself    0 0 0  Trouble concentrating   0 0 1  Moving slowly or fidgety/restless   0 0 0  Suicidal thoughts   0 0 0  PHQ-9 Score   0 8 9  Difficult doing work/chores     Very difficult    phq 9 is negative  Fall Risk:    10/26/2023    3:23 PM 07/30/2023   12:56 PM 06/22/2023    3:41 PM 02/17/2023    3:06 PM 08/18/2022    1:51 PM  Fall Risk   Falls in the past year? 0 0 0 0 0  Number falls in past yr: 0 0 0    Injury with Fall? 0 0 0    Risk for fall due to : No Fall Risks No Fall Risks  No Fall Risks No Fall Risks  Follow up Falls evaluation completed Falls evaluation completed Falls evaluation completed Falls prevention discussed Falls prevention  discussed    Assessment & Plan Ductal carcinoma in situ of left breast, status post partial mastectomy and radiation, on anastrozole  Status post partial mastectomy and radiation therapy for ductal carcinoma in situ of the left breast. Currently on anastrozole  for ER-positive ductal carcinoma with side effects including hot flashes and fatigue. - Continue anastrozole  for  ER-positive ductal carcinoma. - Schedule mammogram for April 2026. - Follow up with oncologist and radiation oncologist annually. - Discuss side effects of anastrozole  with oncologist for potential medication adjustment if intolerable.  Type 2 diabetes mellitus, with associated HTN, dyslipidemia  A1c improved from 7.2% to 6.7% with diet and exercise. Declined metformin  and pioglitazone at this time.  - Monitor blood glucose levels through diet and exercise. - Re-evaluate A1c at next visit. - Consider medication if A1c rises above 7%.  Hypertension Blood pressure slightly elevated at 144/78 mmHg. Currently on losartan  with no reported side effects. - Increase losartan  dose from 50 mg to 100 mg daily. - Monitor blood pressure regularly. - Follow up in four months to reassess blood pressure control.  Dyslipidemia Currently on atorvastatin  with no reported side effects. - Continue atorvastatin  for dyslipidemia management.  Hyperparathyroidism, without hypercalcemia  Under endocrinologist care. - Continue follow-up with endocrinologist for management of primary hyperparathyroidism.  Thyroid  nodules, under surveillance Thyroid  nodules under surveillance for potential hyperthyroidism secondary to low vitamin D . - Continue monitoring thyroid  nodules with endocrinologist.  Gastroesophageal reflux disease GERD managed with omeprazole  with no reported issues. - Continue omeprazole  for GERD management.  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 Reports feeling better emotionally. Continues mirtazapine  for sleep. - Continue sertraline  for depression management. - Continue mirtazapine  for sleep.  Atherosclerosis of aorta Atherosclerosis of the aorta managed with atorvastatin  and baby aspirin. - Continue atorvastatin  and baby aspirin for atherosclerosis management.  General Health Maintenance Plans to receive flu vaccination at work. -  Receive flu vaccination at work in mid-October.  Follow-Up Follow-up planned to monitor blood pressure, diabetes control, and medication management. - Follow up in four months for reassessment of blood pressure and diabetes control. - Return for blood pressure check after work if possible.

## 2023-10-29 DIAGNOSIS — E1129 Type 2 diabetes mellitus with other diabetic kidney complication: Secondary | ICD-10-CM | POA: Diagnosis not present

## 2023-10-29 DIAGNOSIS — R809 Proteinuria, unspecified: Secondary | ICD-10-CM | POA: Diagnosis not present

## 2023-10-30 ENCOUNTER — Ambulatory Visit: Payer: Self-pay | Admitting: Family Medicine

## 2023-10-30 LAB — MICROALBUMIN / CREATININE URINE RATIO
Creatinine, Urine: 75 mg/dL (ref 20–275)
Microalb Creat Ratio: 11 mg/g{creat} (ref <30)
Microalb, Ur: 0.8 mg/dL

## 2023-11-10 ENCOUNTER — Ambulatory Visit

## 2023-12-08 NOTE — Progress Notes (Signed)
 HENNESSY BARTEL                                          MRN: [id number]   12/08/2023   The [facility]BC[facility] Quality Team Specialist reviewed this patient medical record for the purposes of chart review for care gap closure. The following were reviewed: abstraction for care gap closure-kidney health evaluation for diabetes:eGFR  and uACR.    VBCI Quality Team

## 2023-12-10 NOTE — Progress Notes (Signed)
 Laurie Daniels                                          MRN: [id number]   12/10/2023   The VBCI Quality Team Specialist reviewed this patient medical record for the purposes of chart review for care gap closure. The following were reviewed: chart review for care gap closure-controlling blood pressure.    VBCI Quality Team

## 2023-12-27 ENCOUNTER — Other Ambulatory Visit: Payer: Self-pay | Admitting: Oncology

## 2024-01-06 ENCOUNTER — Ambulatory Visit: Admitting: Internal Medicine

## 2024-01-06 ENCOUNTER — Encounter: Payer: Self-pay | Admitting: Oncology

## 2024-01-06 ENCOUNTER — Inpatient Hospital Stay: Attending: Oncology

## 2024-01-06 ENCOUNTER — Inpatient Hospital Stay (HOSPITAL_BASED_OUTPATIENT_CLINIC_OR_DEPARTMENT_OTHER): Admitting: Oncology

## 2024-01-06 ENCOUNTER — Encounter: Payer: Self-pay | Admitting: Internal Medicine

## 2024-01-06 VITALS — BP 130/63 | HR 50 | Temp 97.6°F | Resp 18 | Wt 147.6 lb

## 2024-01-06 VITALS — BP 148/84 | HR 96 | Temp 98.0°F | Resp 16 | Ht 63.0 in | Wt 145.9 lb

## 2024-01-06 DIAGNOSIS — E119 Type 2 diabetes mellitus without complications: Secondary | ICD-10-CM | POA: Insufficient documentation

## 2024-01-06 DIAGNOSIS — R062 Wheezing: Secondary | ICD-10-CM | POA: Diagnosis not present

## 2024-01-06 DIAGNOSIS — M8580 Other specified disorders of bone density and structure, unspecified site: Secondary | ICD-10-CM

## 2024-01-06 DIAGNOSIS — Z79899 Other long term (current) drug therapy: Secondary | ICD-10-CM | POA: Diagnosis not present

## 2024-01-06 DIAGNOSIS — Z801 Family history of malignant neoplasm of trachea, bronchus and lung: Secondary | ICD-10-CM | POA: Diagnosis not present

## 2024-01-06 DIAGNOSIS — I10 Essential (primary) hypertension: Secondary | ICD-10-CM | POA: Diagnosis not present

## 2024-01-06 DIAGNOSIS — J069 Acute upper respiratory infection, unspecified: Secondary | ICD-10-CM

## 2024-01-06 DIAGNOSIS — D0512 Intraductal carcinoma in situ of left breast: Secondary | ICD-10-CM | POA: Diagnosis not present

## 2024-01-06 DIAGNOSIS — E876 Hypokalemia: Secondary | ICD-10-CM | POA: Diagnosis not present

## 2024-01-06 DIAGNOSIS — Z7982 Long term (current) use of aspirin: Secondary | ICD-10-CM | POA: Insufficient documentation

## 2024-01-06 DIAGNOSIS — E1140 Type 2 diabetes mellitus with diabetic neuropathy, unspecified: Secondary | ICD-10-CM | POA: Diagnosis not present

## 2024-01-06 DIAGNOSIS — Z79811 Long term (current) use of aromatase inhibitors: Secondary | ICD-10-CM | POA: Insufficient documentation

## 2024-01-06 DIAGNOSIS — Z923 Personal history of irradiation: Secondary | ICD-10-CM | POA: Insufficient documentation

## 2024-01-06 DIAGNOSIS — R051 Acute cough: Secondary | ICD-10-CM

## 2024-01-06 LAB — CBC WITH DIFFERENTIAL (CANCER CENTER ONLY)
Abs Immature Granulocytes: 0.03 K/uL (ref 0.00–0.07)
Basophils Absolute: 0 K/uL (ref 0.0–0.1)
Basophils Relative: 1 %
Eosinophils Absolute: 0.4 K/uL (ref 0.0–0.5)
Eosinophils Relative: 5 %
HCT: 35.6 % — ABNORMAL LOW (ref 36.0–46.0)
Hemoglobin: 11.4 g/dL — ABNORMAL LOW (ref 12.0–15.0)
Immature Granulocytes: 0 %
Lymphocytes Relative: 32 %
Lymphs Abs: 2.5 K/uL (ref 0.7–4.0)
MCH: 28.9 pg (ref 26.0–34.0)
MCHC: 32 g/dL (ref 30.0–36.0)
MCV: 90.1 fL (ref 80.0–100.0)
Monocytes Absolute: 0.6 K/uL (ref 0.1–1.0)
Monocytes Relative: 8 %
Neutro Abs: 4.4 K/uL (ref 1.7–7.7)
Neutrophils Relative %: 54 %
Platelet Count: 213 K/uL (ref 150–400)
RBC: 3.95 MIL/uL (ref 3.87–5.11)
RDW: 14.6 % (ref 11.5–15.5)
WBC Count: 7.9 K/uL (ref 4.0–10.5)
nRBC: 0 % (ref 0.0–0.2)

## 2024-01-06 LAB — CMP (CANCER CENTER ONLY)
ALT: 16 U/L (ref 0–44)
AST: 19 U/L (ref 15–41)
Albumin: 4.4 g/dL (ref 3.5–5.0)
Alkaline Phosphatase: 91 U/L (ref 38–126)
Anion gap: 10 (ref 5–15)
BUN: 19 mg/dL (ref 8–23)
CO2: 26 mmol/L (ref 22–32)
Calcium: 9.6 mg/dL (ref 8.9–10.3)
Chloride: 100 mmol/L (ref 98–111)
Creatinine: 0.84 mg/dL (ref 0.44–1.00)
GFR, Estimated: >60 mL/min (ref >60)
Glucose, Bld: 123 mg/dL — ABNORMAL HIGH (ref 70–99)
Potassium: 3.4 mmol/L — ABNORMAL LOW (ref 3.5–5.1)
Sodium: 136 mmol/L (ref 135–145)
Total Bilirubin: 0.2 mg/dL (ref 0.0–1.2)
Total Protein: 7.4 g/dL (ref 6.5–8.1)

## 2024-01-06 MED ORDER — BENZONATATE 100 MG PO CAPS: 100.0000 mg | ORAL_CAPSULE | Freq: Two times a day (BID) | ORAL | 0 refills | Status: DC | PRN

## 2024-01-06 MED ORDER — METHYLPREDNISOLONE 4 MG PO TBPK: ORAL_TABLET | ORAL | 0 refills | Status: DC

## 2024-01-06 MED ORDER — ALBUTEROL SULFATE (2.5 MG/3ML) 0.083% IN NEBU
2.5000 mg | INHALATION_SOLUTION | Freq: Once | RESPIRATORY_TRACT | Status: AC
  Administered 2024-01-11: 2.5 mg via RESPIRATORY_TRACT

## 2024-01-06 NOTE — Progress Notes (Signed)
 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  ER 100%+  Pathology results were reviewed with patient.  S/p adjuvant Radiation.  recommend adjuvant endocrine therapy with aromatase inhibitor.  She tolerates Arimidex  well. Continue Arimidex  1mg  [started on 09/30/2023].  Plan 5 years. Annual mammogram for surveillance.     Osteopenia Oct 2024 DEXA - osteopenia.  Recommend calcium  1200mg  and Vitamin D  supplementation.   Hypokalemia Recommend patient to increase intake of potassium rich food.   Orders Placed This Encounter  Procedures   CMP (Cancer Center only)    Standing Status:   Future    Expected Date:   07/05/2024    Expiration Date:   10/03/2024   CBC with Differential (Cancer Center Only)    Standing Status:   Future    Expected Date:   07/05/2024    Expiration Date:   10/03/2024   Follow up 6 months.  All questions were answered. The patient knows to call the clinic with any problems, questions or concerns.  Zelphia Cap, MD, PhD [facility] Hematology Oncology 01/06/2024    HISTORY OF PRESENTING ILLNESS:  Laurie Daniels 68 y.o. female presents to establish care for left breast DCIS  I have reviewed her chart and materials related to her cancer extensively and collaborated history with the patient. Summary of oncologic history is as follows: Oncology History  Ductal carcinoma in situ (DCIS) of left breast  06/29/2023 Mammogram   Screening mammogram  In the left breast, calcifications warrant further evaluation. In the right breast, no findings suspicious for malignancy.   07/01/2023 Mammogram   Bilateral diagnostic mammogram  LEFT breast 5 mm group of amorphous calcification in the upper-outer quadrant is  low suspicion for malignancy. Recommend further assessment with stereotactic guided biopsy.   07/09/2023 Initial Diagnosis   Ductal carcinoma in situ (DCIS) of left breast  Patient underwent left breast biopsy.  Pathology showed 1. Breast, left, needle core biopsy, upper outer quadrant (ribbon clip) :   - DUCTAL CARCINOMA IN SITU, HIGH GRADE   - FOCAL MICROINVASION CANNOT BE ENTIRELY RULED OUT   - NECROSIS: PRESENT   - CALCIFICATIONS: PRESENT   - DCIS LENGTH: 0.2 CM     First live birth at age of 7 OCP use: yes History of hysterectomy: no Menopausal status: post History of HRT use: no History of chest radiation: no Number of previous breast biopsies:  no   07/09/2023 Cancer Staging   Staging form: Breast, AJCC 8th Edition - Clinical stage from 07/09/2023: Stage 0 (cTis (DCIS), cN0, cM0, G3) - Signed by Cap Zelphia, MD on 07/09/2023 Stage prefix: Initial diagnosis Histologic grading system: 3 grade system   07/17/2023 Surgery      1. Breast, lumpectomy, left breast mass :       BREAST TISSUE WITH EXTENSIVE INFLAMMATION AND FOREIGN MATERIAL REACTION       CONSISTENT WITH BIOPSY SITE CHANGES.       FOCAL ATYPICAL DUCTAL HYPERPLASIA (0.2 MM).       BIOPSY CLIP (RIBBON SHAPED) IDENTIFIED.       NO EVIDENCE OF RESIDUAL CARCINOMA.       SEE ONCOLOGY TABLE.   2. Lymph node, sentinel, biopsy, left axillary #1 :  ONE LYMPH NODE, NEGATIVE FOR METASTATIC CARCINOMA (0/1).       3. Breast, excision, left breast reexcision, posterior margin stitch inside cavity :      BENIGN BREAST TISSUE.      NEGATIVE FOR ATYPIA OR MALIGNANCY.       4. Breast, excision, left breast anterior margin stitch inside cavity, anterior margin stitch inside cavity :      BENIGN BREAST TISSUE WITH APOCRINE CYSTS.      NEGATIVE FOR ATYPIA OR MALIGNANCY.       5. Lymph node, sentinel, biopsy, left axillary #2 :      ONE LYMPH NODE, NEGATIVE FOR METASTATIC CARCINOMA (0/1).  ONCOLOGY TABLE      DCIS OF THE  BREAST:  Resection ([id number])      Biopsy ([id number])      Procedure: Lumpectomy      Specimen Laterality: Left      Histologic Type: No residual carcinoma in the resection, Ductal carcinoma in      situ in the biopsy      Size of DCIS: 0.2 cm ([id number])      Nuclear Grade: High - grade (biopsy)      Necrosis: Present (biopsy)      Margins: All margins negative for DCIS      Specify Closest Margin (required only if <50mm): NA      Regional Lymph Nodes: Not applicable (no lymph nodes submitted or found)      Number of Lymph Nodes Examined: 0      Number of Sentinel Nodes Examined (if applicable): 0      Number of Lymph Nodes with Macrometastases: NA      Number of Lymph Nodes with Micrometastases): NA      Number of Lymph Nodes with Isolated Tumor Cells (=0.2 mm or =200 cells): NA      Extranodal Extension: NA      Breast Biomarker Testing Performed on Previous Biopsy:      Testing performed on Case Number: [id number]      Estrogen Receptor: 100%, positive, strong staining intensity      Progesterone Receptor: 50%, positive, weak-moderate staining intensity      Pathologic Stage Classification (pTNM, AJCC 8th Edition): pTis, pNx     08/25/2023 - 09/14/2023 Radiation Therapy   Status post left breast adjuvant radiation.   09/30/2023 -  Anti-estrogen oral therapy   Started on Arimidex  1 mg daily.    She presents for follow-up.  Patient tolerates Arimidex .  Manageable hot flash and muscle/joint pain.   MEDICAL HISTORY:  Past Medical History:  Diagnosis Date   Anxiety and depression    Atherosclerosis of aorta    CTS (carpal tunnel syndrome)    Depressive disorder    Diabetic neuropathy (HCC)    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   Hyperactivity of bladder    Hypertension    Insomnia    Lumbar sprain    Ovarian failure    Rhinitis    Type 2 diabetes mellitus without complication    Vaginitis and vulvovaginitis     SURGICAL HISTORY: Past  Surgical History:  Procedure Laterality Date   AXILLARY SENTINEL NODE BIOPSY Left 07/17/2023   Procedure: BIOPSY, LYMPH NODE, SENTINEL, AXILLARY;  Surgeon: Rodolph Romano, MD;  Location: [facility];  Service: General;  Laterality: Left;   BREAST BIOPSY Left 07/03/2023   Stereo bx, ribbon clip, path pending   BREAST BIOPSY Left 07/03/2023   MM LT BREAST BX  W LOC DEV 1ST LESION IMAGE BX SPEC STEREO GUIDE 07/03/2023 [facility]   BREAST BIOPSY  07/14/2023   MM LT BREAST SAVI/RF TAG 1ST LESION MAMMO GUIDE 07/14/2023 [facility]   BREAST LUMPECTOMY WITH RADIO FREQUENCY LOCALIZER Left 07/17/2023   Procedure: LEFT BREAST PARTIAL MASTECTOMY  WITH RADIO FREQUENCY LOCALIZER;  Surgeon: Rodolph Romano, MD;  Location: [facility];  Service: General;  Laterality: Left;   CATARACT EXTRACTION Right 08/31/2017   CESAREAN SECTION     COLONOSCOPY  06/17/2007   Dr Jinny   COLONOSCOPY WITH PROPOFOL  N/A 07/22/2017   Procedure: COLONOSCOPY WITH PROPOFOL ;  Surgeon: Dessa Reyes ORN, MD;  Location: [facility];  Service: Endoscopy;  Laterality: N/A;   TUBAL LIGATION     vitrectomy Right 07/10/2016   done at [facility]    SOCIAL HISTORY: Social History   Socioeconomic History   Marital status: Divorced    Spouse name: Not on file   Number of children: 2   Years of education: Not on file   Highest education level: 12th grade  Occupational History   Occupation: housekeeping   Tobacco Use   Smoking status: Never   Smokeless tobacco: Never  Vaping Use   Vaping status: Never Used  Substance and Sexual Activity   Alcohol use: No    Alcohol/week: 0.0 standard drinks of alcohol   Drug use: No   Sexual activity: Yes    Partners: Male  Other Topics Concern   Not on file  Social History Narrative   Divorced since 2005, she has a boyfriend but they don't live together      Pt's son and granddaughter lives with her   Social Drivers of Corporate Investment Banker Strain: Low Risk  (07/30/2023)    Overall Financial Resource Strain (CARDIA)    Difficulty of Paying Living Expenses: Not hard at all  Recent Concern: Financial Resource Strain - High Risk (07/09/2023)   Received from [facility]   Overall Financial Resource Strain (CARDIA)    Difficulty of Paying Living Expenses: Hard  Food Insecurity: No Food Insecurity (07/30/2023)   Hunger Vital Sign    Worried About Running Out of Food in the Last Year: Never true    Ran Out of Food in the Last Year: Never true  Recent Concern: Food Insecurity - Food Insecurity Present (07/09/2023)   Received from [facility]   Hunger Vital Sign    Within the past 12 months, you worried that your food would run out before you got the money to buy more.: Sometimes true    Within the past 12 months, the food you bought just didn't last and you didn't have money to get more.: Never true  Transportation Needs: No Transportation Needs (07/30/2023)   PRAPARE - Administrator, Civil Service (Medical): No    Lack of Transportation (Non-Medical): No  Physical Activity: Insufficiently Active (07/30/2023)   Exercise Vital Sign    Days of Exercise per Week: 3 days    Minutes of Exercise per Session: 30 min  Stress: No Stress Concern Present (07/30/2023)   [employer] of Occupational Health - Occupational Stress Questionnaire    Feeling of Stress : Not at all  Social Connections: Moderately Isolated (07/30/2023)   Social Connection and Isolation Panel    Frequency of Communication with Friends and Family: More than three times a week    Frequency of Social Gatherings with Friends and Family: More than three times a week  Attends Religious Services: More than 4 times per year    Active Member of Clubs or Organizations: No    Attends Banker Meetings: Never    Marital Status: Divorced  Catering Manager Violence: Not At Risk (07/30/2023)   Humiliation, Afraid, Rape, and Kick questionnaire    Fear  of Current or Ex-Partner: No    Emotionally Abused: No    Physically Abused: No    Sexually Abused: No    FAMILY HISTORY: Family History  Problem Relation Age of Onset   Hypertension Mother    Diabetes Mother    Lung cancer Father    Colon cancer Neg Hx     ALLERGIES:  has no known allergies.  MEDICATIONS:  Current Outpatient Medications  Medication Sig Dispense Refill   anastrozole  (ARIMIDEX ) 1 MG tablet TAKE 1 TABLET BY MOUTH EVERY DAY 90 tablet 1   aspirin 81 MG tablet Take 81 mg by mouth daily.     atorvastatin  (LIPITOR) 40 MG tablet Take 1 tablet (40 mg total) by mouth daily. 90 tablet 0   benzonatate  (TESSALON ) 100 MG capsule Take 1 capsule (100 mg total) by mouth 2 (two) times daily as needed for cough. 20 capsule 0   Cholecalciferol (VITAMIN D3) 50 MCG (2000 UT) capsule TAKE 1 CAPSULE EVERY DAY 100 capsule 0   Continuous Glucose Sensor (DEXCOM G7 SENSOR) MISC 1 applicator by Does not apply route as needed. 2 each 5   losartan  (COZAAR ) 100 MG tablet Take 1 tablet (100 mg total) by mouth daily. 90 tablet 0   methylPREDNISolone  (MEDROL  DOSEPAK) 4 MG TBPK tablet Use as directed. 21 each 0   mirtazapine  (REMERON ) 15 MG tablet Take 1 tablet (15 mg total) by mouth at bedtime. 90 tablet 0   omeprazole  (PRILOSEC) 20 MG capsule Take 1 capsule (20 mg total) by mouth daily. 90 capsule 1   vitamin B-12 (CYANOCOBALAMIN ) 100 MCG tablet Take 100 mcg by mouth daily.     Current Facility-Administered Medications  Medication Dose Route Frequency Provider Last Rate Last Admin   albuterol (PROVENTIL) (2.5 MG/3ML) 0.083% nebulizer solution 2.5 mg  2.5 mg Nebulization Once         Review of Systems  Constitutional:  Negative for appetite change, chills, fatigue and fever.  HENT:   Negative for hearing loss and voice change.   Eyes:  Negative for eye problems.  Respiratory:  Negative for chest tightness and cough.   Cardiovascular:  Negative for chest pain.  Gastrointestinal:  Negative for  abdominal distention, abdominal pain and blood in stool.  Endocrine: Negative for hot flashes.  Genitourinary:  Negative for difficulty urinating and frequency.   Musculoskeletal:  Negative for arthralgias.  Skin:  Negative for itching and rash.  Neurological:  Negative for extremity weakness.  Hematological:  Negative for adenopathy.  Psychiatric/Behavioral:  Negative for confusion.      PHYSICAL EXAMINATION: ECOG PERFORMANCE STATUS: 0 - Asymptomatic  Vitals:   01/06/24 1415  BP: 130/63  Pulse: (!) 50  Resp: 18  Temp: 97.6 F (36.4 C)   Filed Weights   01/06/24 1415  Weight: 147 lb 9.6 oz (67 kg)    Physical Exam Constitutional:      General: She is not in acute distress.    Appearance: She is not diaphoretic.  HENT:     Head: Normocephalic and atraumatic.     Nose: Nose normal.     Mouth/Throat:     Pharynx: No oropharyngeal exudate.  Eyes:  General: No scleral icterus.    Pupils: Pupils are equal, round, and reactive to light.  Cardiovascular:     Rate and Rhythm: Normal rate and regular rhythm.     Heart sounds: No murmur heard. Pulmonary:     Effort: Pulmonary effort is normal. No respiratory distress.     Breath sounds: No rales.  Chest:     Chest wall: No tenderness.  Abdominal:     General: There is no distension.     Palpations: Abdomen is soft.     Tenderness: There is no abdominal tenderness.  Musculoskeletal:        General: Normal range of motion.     Cervical back: Normal range of motion and neck supple.  Skin:    General: Skin is warm and dry.     Findings: No erythema.  Neurological:     Mental Status: She is alert and oriented to person, place, and time.     Cranial Nerves: No cranial nerve deficit.     Motor: No abnormal muscle tone.     Coordination: Coordination normal.  Psychiatric:        Mood and Affect: Affect normal.     LABORATORY DATA:  I have reviewed the data as listed    Latest Ref Rng & Units 01/06/2024    1:53 PM  09/07/2023    1:52 PM 08/24/2023    1:55 PM  CBC  WBC 4.0 - 10.5 K/uL 7.9  7.6  10.4   Hemoglobin 12.0 - 15.0 g/dL 88.5  88.3  88.4   Hematocrit 36.0 - 46.0 % 35.6  36.5  36.4   Platelets 150 - 400 K/uL 213  231  204       Latest Ref Rng & Units 01/06/2024    1:52 PM 05/12/2023    3:33 PM 02/11/2022    3:59 PM  CMP  Glucose 70 - 99 mg/dL 876  892  876   BUN 8 - 23 mg/dL 19  17  19    Creatinine 0.44 - 1.00 mg/dL 9.15  9.18  9.21   Sodium 135 - 145 mmol/L 136  141  142   Potassium 3.5 - 5.1 mmol/L 3.4  4.3  3.9   Chloride 98 - 111 mmol/L 100  103  105   CO2 22 - 32 mmol/L 26  32  27   Calcium  8.9 - 10.3 mg/dL 9.6  89.9  89.7    89.7   Total Protein 6.5 - 8.1 g/dL 7.4  7.4  7.4   Total Bilirubin 0.0 - 1.2 mg/dL 0.2  0.4  0.3   Alkaline Phos 38 - 126 U/L 91     AST 15 - 41 U/L 19  14  13    ALT 0 - 44 U/L 16  14  14       RADIOGRAPHIC STUDIES: I have personally reviewed the radiological images as listed and agreed with the findings in the report. No results found.

## 2024-01-06 NOTE — Assessment & Plan Note (Addendum)
 Left breast high grade DCIS, s/p lumpectomy + SLNB.  ER 100%+  Pathology results were reviewed with patient.  S/p adjuvant Radiation.  recommend adjuvant endocrine therapy with aromatase inhibitor.  She tolerates Arimidex  well. Continue Arimidex  1mg  [started on 09/30/2023].  Plan 5 years. Annual mammogram for surveillance.

## 2024-01-06 NOTE — Assessment & Plan Note (Signed)
 Oct 2024 DEXA - osteopenia.  Recommend calcium  1200mg  and Vitamin D  supplementation.

## 2024-01-06 NOTE — Progress Notes (Signed)
 Acute Office Visit  Subjective:     Patient ID: Laurie Daniels, female    DOB: 12-13-1955, 68 y.o.   MRN: [id number]  Chief Complaint  Patient presents with   URI    Cough, w/SOB worse at night    URI  Associated symptoms include coughing and wheezing. Pertinent negatives include no congestion, sinus pain or sore throat.   Patient is in today for cough, SOB and wheezing x 2 days but primarily at night.   Discussed the use of AI scribe software for clinical note transcription with the patient, who gave verbal consent to proceed.  History of Present Illness Laurie Daniels is a 68 year old female who presents with shortness of breath and dry cough.  She has experienced shortness of breath and a dry cough for the past two days, primarily at night. The shortness of breath is severe when lying flat, causing significant discomfort and a sensation of being unable to breathe. She has no history of asthma and does not use inhalers or nasal sprays. There are no upper respiratory symptoms such as congestion or sore throat, and no fever. Her medical history includes breast cancer, for which she is on hormonal therapy and has undergone radiation treatment. She has not noticed any changes in her breathing since the cancer treatment. Her shortness of breath worsened when rushing to the appointment, and she also experiences wheezing.   Review of Systems  Constitutional:  Negative for chills and fever.  HENT:  Negative for congestion, sinus pain and sore throat.   Respiratory:  Positive for cough, shortness of breath and wheezing. Negative for sputum production.         Objective:    BP (!) 148/84   Pulse 96   Temp 98 F (36.7 C)   Resp 16   Ht 5' 3 (1.6 m)   Wt 145 lb 14.4 oz (66.2 kg)   SpO2 98%   BMI 25.85 kg/m  BP Readings from Last 3 Encounters:  01/06/24 (!) 148/84  10/26/23 (!) 144/78  10/19/23 (!) 143/72   Wt Readings from Last 3 Encounters:  01/06/24 145 lb 14.4 oz  (66.2 kg)  10/26/23 149 lb 8 oz (67.8 kg)  09/30/23 149 lb 8 oz (67.8 kg)      Physical Exam Constitutional:      Appearance: Normal appearance.  HENT:     Head: Normocephalic and atraumatic.  Eyes:     Conjunctiva/sclera: Conjunctivae normal.  Cardiovascular:     Rate and Rhythm: Normal rate and regular rhythm.  Pulmonary:     Effort: Pulmonary effort is normal.     Breath sounds: Wheezing present. No rhonchi or rales.     Comments: Decreased air movement throughout  Skin:    General: Skin is warm and dry.  Neurological:     General: No focal deficit present.     Mental Status: She is alert. Mental status is at baseline.  Psychiatric:        Mood and Affect: Mood normal.        Behavior: Behavior normal.     No results found for any visits on 01/06/24.      Assessment & Plan:   Assessment & Plan Acute dry cough and nocturnal shortness of breath Two days, primarily at night when supine. No wheezing or pneumonia sounds. Oxygen saturation 98%. No upper respiratory symptoms, fever, or recent changes in breathing post-radiation for breast cancer. Likely bronchospasm or airway constriction. - Administered  Medrol  Dosepak to reduce airway inflammation. - Administered breathing treatment with a mask to deliver medication directly to the lungs. - Monitor blood glucose levels due to potential steroid-induced hyperglycemia. - Advised to report worsening symptoms for potential chest x-ray.  - methylPREDNISolone  (MEDROL  DOSEPAK) 4 MG TBPK tablet; Use as directed.  Dispense: 21 each; Refill: 0 - albuterol (PROVENTIL) (2.5 MG/3ML) 0.083% nebulizer solution 2.5 mg - benzonatate  (TESSALON ) 100 MG capsule; Take 1 capsule (100 mg total) by mouth 2 (two) times daily as needed for cough.  Dispense: 20 capsule; Refill: 0   Return if symptoms worsen or fail to improve.  Sharyle Fischer, DO

## 2024-01-06 NOTE — Assessment & Plan Note (Signed)
 Recommend patient to increase intake of potassium rich food.

## 2024-01-07 ENCOUNTER — Ambulatory Visit: Admitting: Nurse Practitioner

## 2024-01-11 ENCOUNTER — Other Ambulatory Visit: Payer: Self-pay | Admitting: Family Medicine

## 2024-01-11 DIAGNOSIS — J069 Acute upper respiratory infection, unspecified: Secondary | ICD-10-CM | POA: Diagnosis not present

## 2024-01-11 DIAGNOSIS — F5101 Primary insomnia: Secondary | ICD-10-CM

## 2024-01-11 DIAGNOSIS — R051 Acute cough: Secondary | ICD-10-CM

## 2024-01-11 DIAGNOSIS — E1129 Type 2 diabetes mellitus with other diabetic kidney complication: Secondary | ICD-10-CM

## 2024-01-13 NOTE — Telephone Encounter (Signed)
 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[facility])   [place] [facility] [name], MD   6 months ago Diabetes mellitus with microalbuminuria [facility])   [facility] [name], MD   7 months ago Eustachian tube dysfunction, bilateral   [facility] [name], DO       Future Appointments             In 1 month [name], [name], MD [facility] [place]             losartan  (COZAAR ) 100 MG tablet [Pharmacy Med Name: LOSARTAN  POTASSIUM 100 MG Oral Tablet] 90 tablet 0    Sig: TAKE 1 TABLET EVERY DAY     Cardiovascular:  Angiotensin Receptor Blockers Failed - 01/13/2024 12:59 PM      Failed - K in normal range and within 180 days    Potassium  Date Value Ref Range Status  01/06/2024 3.4 (L) 3.5 - 5.1 mmol/L Final  01/04/2013 3.5 3.5 - 5.1 mmol/L Final         Passed - Cr in normal range and within 180 days    Creatinine  Date Value Ref Range Status  01/06/2024 0.84 0.44 - 1.00 mg/dL Final   Creat  Date Value Ref Range Status  05/12/2023 0.81 0.50 - 1.05 mg/dL Final   Creatinine, Urine  Date Value Ref Range Status  10/29/2023 75 20 - 275 mg/dL Final         Passed - Patient is not pregnant      Passed - Last BP in normal range    BP Readings from Last 1 Encounters:  01/06/24 130/63         Passed - Valid encounter within last 6 months    Recent Outpatient Visits            1 week ago Viral upper respiratory tract infection   [facility] [name], DO   2 months ago Diabetes mellitus with microalbuminuria [facility])   [place] [facility] [name], MD   6 months ago Diabetes mellitus with microalbuminuria [facility])   [place] [facility] [name], MD   7 months ago Eustachian tube dysfunction, bilateral   [facility] [name], DO       Future Appointments             In 1 month [name], [name], MD [facility] [place]

## 2024-01-21 ENCOUNTER — Inpatient Hospital Stay: Admitting: *Deleted

## 2024-01-21 DIAGNOSIS — D0512 Intraductal carcinoma in situ of left breast: Secondary | ICD-10-CM

## 2024-01-21 DIAGNOSIS — Z9189 Other specified personal risk factors, not elsewhere classified: Secondary | ICD-10-CM

## 2024-01-21 NOTE — Progress Notes (Signed)
 SUBJECTIVE: Pt returns for her 3 month L-Dex screen.    PAIN:  Are you having pain? No   SOZO SCREENING: Patient was assessed today using the SOZO machine to determine the lymphedema index score. This was compared to her baseline score. It was determined that she is within the recommended range when compared to her baseline and no further action is needed at this time. She will continue SOZO screenings. These are done every 3 months for 2 years post operatively followed by every 6 months for 2 years, and then annually.     L-DEX FLOWSHEETS                L-DEX LYMPHEDEMA SCREENING    Measurement Type Unilateral     L-DEX MEASUREMENT EXTREMITY Upper Extremity     POSITION  Standing     DOMINANT SIDE Right     At Risk Side Left     BASELINE SCORE (UNILATERAL) 1.5    L-DEX SCORE (UNILATERAL) 2.8    VALUE CHANGE (UNILAT) 1.3

## 2024-02-01 ENCOUNTER — Telehealth: Payer: Self-pay | Admitting: Family Medicine

## 2024-02-01 NOTE — Telephone Encounter (Signed)
 Appt schd.

## 2024-02-01 NOTE — Telephone Encounter (Signed)
 Patient has been having episodes of SOB and panic attacks at night.  Will start out breathing then Hard to breath, wheezing. . Please advise. May speak to my sister Sherrilyn Kenner

## 2024-02-03 ENCOUNTER — Ambulatory Visit: Admitting: Family Medicine

## 2024-02-03 ENCOUNTER — Ambulatory Visit
Admission: RE | Admit: 2024-02-03 | Discharge: 2024-02-03 | Disposition: A | Source: Ambulatory Visit | Attending: Family Medicine | Admitting: Family Medicine

## 2024-02-03 ENCOUNTER — Encounter: Payer: Self-pay | Admitting: Family Medicine

## 2024-02-03 VITALS — BP 118/76 | HR 94 | Temp 97.9°F | Resp 16 | Ht 63.0 in | Wt 147.0 lb

## 2024-02-03 DIAGNOSIS — D0512 Intraductal carcinoma in situ of left breast: Secondary | ICD-10-CM

## 2024-02-03 DIAGNOSIS — R0600 Dyspnea, unspecified: Secondary | ICD-10-CM

## 2024-02-03 DIAGNOSIS — R052 Subacute cough: Secondary | ICD-10-CM | POA: Insufficient documentation

## 2024-02-03 DIAGNOSIS — F410 Panic disorder [episodic paroxysmal anxiety] without agoraphobia: Secondary | ICD-10-CM | POA: Diagnosis not present

## 2024-02-03 DIAGNOSIS — J45998 Other asthma: Secondary | ICD-10-CM | POA: Diagnosis not present

## 2024-02-03 DIAGNOSIS — R0602 Shortness of breath: Secondary | ICD-10-CM | POA: Diagnosis not present

## 2024-02-03 DIAGNOSIS — R059 Cough, unspecified: Secondary | ICD-10-CM | POA: Diagnosis not present

## 2024-02-03 DIAGNOSIS — F419 Anxiety disorder, unspecified: Secondary | ICD-10-CM | POA: Diagnosis not present

## 2024-02-03 DIAGNOSIS — I517 Cardiomegaly: Secondary | ICD-10-CM | POA: Diagnosis not present

## 2024-02-03 MED ORDER — CLONAZEPAM 0.5 MG PO TABS: 0.5000 mg | ORAL_TABLET | Freq: Every day | ORAL | 0 refills | Status: DC | PRN

## 2024-02-03 MED ORDER — ESCITALOPRAM OXALATE 5 MG PO TABS: 5.0000 mg | ORAL_TABLET | Freq: Every day | ORAL | 0 refills | Status: DC

## 2024-02-03 MED ORDER — FLUTICASONE-SALMETEROL 100-50 MCG/ACT IN AEPB: 1.0000 | INHALATION_SPRAY | Freq: Two times a day (BID) | RESPIRATORY_TRACT | 0 refills | Status: AC

## 2024-02-03 MED ORDER — BUSPIRONE HCL 5 MG PO TABS: 5.0000 mg | ORAL_TABLET | Freq: Two times a day (BID) | ORAL | 0 refills | Status: AC

## 2024-02-03 NOTE — Progress Notes (Signed)
 Name: Laurie Daniels   MRN: [id number]    DOB: 04-Jun-1955   Date:02/03/2024       Progress Note  Subjective  Chief Complaint  Chief Complaint  Patient presents with   Shortness of Breath    Unsure if related to some of her meds   Discussed the use of AI scribe software for clinical note transcription with the patient, who gave verbal consent to proceed.  History of Present Illness [name] is a 68 year old female with a history of breast cancer who presents with episodes of nocturnal shortness of breath and panic attacks.  She has been experiencing episodes of nocturnal shortness of breath and panic attacks for the past two to three weeks. These episodes often begin with a cough and progress to difficulty breathing, causing significant anxiety and fear. The sensation is described as 'just give out a breath' and is particularly distressing, lasting until about 3 AM and preventing sleep. She has a history of childhood asthma but has never  smoked.  The symptoms are not bothersome during the day or at work but are more pronounced at night while at home, often while sitting up in bed. She has attempted to use a paper bag to breathe into during these episodes, but it has not been effective. Anxiety is also reported during these episodes.  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 She has not had recent chest imaging since the onset of these symptoms.  In November, she experienced a dry cough and shortness of breath, for which a prednisone taper provided relief. She was also given a nebulizer treatment with albuterol  during a previous visit but does not use it at home due to uncertainty about its operation. No swelling or calf pain is reported.   Current medications include Arimidex  for breast cancer She has not been using an inhaler or nebulizer at  home.    Patient Active Problem List   Diagnosis Date Noted   Hypokalemia 01/06/2024   Atypical ductal hyperplasia of left breast 08/05/2023   Osteopenia 08/05/2023   Ductal carcinoma in situ (DCIS) of left breast 07/09/2023   Osteoarthritis of right knee 10/23/2021   Pain in joint of right knee 10/23/2021   Hyperparathyroidism 05/10/2021   Dyslipidemia associated with type 2 diabetes mellitus (HCC) 05/10/2021   Chronic constipation 01/30/2016   Allergic rhinitis 08/13/2015   Carpal tunnel syndrome 08/13/2015   Atherosclerosis of aorta 08/13/2015   Nocturia 08/13/2015   Migraine without aura and responsive to treatment 08/13/2015   Thyroid  goiter 08/13/2015   Diabetes mellitus with microalbuminuria (HCC) 03/05/2015   Insomnia 10/12/2014   Hyperlipidemia 10/12/2014    Past Surgical History:  Procedure Laterality Date   AXILLARY SENTINEL NODE BIOPSY Left 07/17/2023   Procedure: BIOPSY, LYMPH NODE, SENTINEL, AXILLARY;  Surgeon: Rodolph Romano, MD;  Location: [facility];  Service: General;  Laterality: Left;   BREAST BIOPSY Left 07/03/2023   Stereo bx, ribbon clip, path pending   BREAST BIOPSY Left 07/03/2023   MM LT BREAST BX W LOC DEV 1ST LESION IMAGE BX SPEC STEREO GUIDE 07/03/2023 [facility]   BREAST BIOPSY  07/14/2023   MM LT BREAST SAVI/RF TAG 1ST LESION MAMMO GUIDE 07/14/2023 [facility]   BREAST LUMPECTOMY WITH RADIO FREQUENCY LOCALIZER Left 07/17/2023   Procedure: LEFT BREAST PARTIAL MASTECTOMY  WITH RADIO FREQUENCY LOCALIZER;  Surgeon: Rodolph Romano, MD;  Location:  [facility];  Service: General;  Laterality: Left;   CATARACT EXTRACTION Right 08/31/2017   CESAREAN SECTION     COLONOSCOPY  06/17/2007   Dr Jinny   COLONOSCOPY WITH PROPOFOL  N/A 07/22/2017   Procedure: COLONOSCOPY WITH PROPOFOL ;  Surgeon: Dessa Reyes ORN, MD;  Location: [facility];  Service: Endoscopy;  Laterality: N/A;   TUBAL LIGATION     vitrectomy Right 07/10/2016   done at [facility] History  Problem Relation Age of Onset   Hypertension Mother    Diabetes Mother    Lung cancer Father    Colon cancer Neg Hx     Social History   Tobacco Use   Smoking status: Never   Smokeless tobacco: Never  Substance Use Topics   Alcohol use: No    Alcohol/week: 0.0 standard drinks of alcohol     Current Outpatient Medications:    anastrozole  (ARIMIDEX ) 1 MG tablet, TAKE 1 TABLET BY MOUTH EVERY DAY, Disp: 90 tablet, Rfl: 1   aspirin 81 MG tablet, Take 81 mg by mouth daily., Disp: , Rfl:    atorvastatin  (LIPITOR) 40 MG tablet, Take 1 tablet (40 mg total) by mouth daily., Disp: 90 tablet, Rfl: 0   busPIRone (BUSPAR) 5 MG tablet, Take 1 tablet (5 mg total) by mouth 2 (two) times daily., Disp: 60 tablet, Rfl: 0   Cholecalciferol (VITAMIN D3) 50 MCG (2000 UT) capsule, TAKE 1 CAPSULE EVERY DAY, Disp: 100 capsule, Rfl: 0   clonazePAM (KLONOPIN) 0.5 MG tablet, Take 1 tablet (0.5 mg total) by mouth daily as needed for anxiety., Disp: 5 tablet, Rfl: 0   Continuous Glucose Sensor (DEXCOM G7 SENSOR) MISC, 1 applicator by Does not apply route as needed., Disp: 2 each, Rfl: 5   escitalopram  (LEXAPRO ) 5 MG tablet, Take 1 tablet (5 mg total) by mouth daily., Disp: 30 tablet, Rfl: 0   fluticasone-salmeterol (ADVAIR) 100-50 MCG/ACT AEPB, Inhale 1 puff into the lungs 2 (two) times daily., Disp: 1 each, Rfl: 0   losartan  (COZAAR ) 100 MG tablet, TAKE 1 TABLET EVERY DAY, Disp: 90 tablet, Rfl: 0   mirtazapine  (REMERON ) 15 MG tablet, TAKE 1 TABLET AT BEDTIME, Disp: 90 tablet, Rfl: 0   omeprazole  (PRILOSEC) 20 MG capsule, Take 1 capsule (20 mg total) by mouth daily., Disp: 90 capsule, Rfl: 1   vitamin B-12 (CYANOCOBALAMIN ) 100 MCG tablet, Take 100 mcg by mouth daily., Disp: , Rfl:   No Known Allergies  I personally reviewed active problem list, medication list, allergies, family history with the patient/caregiver today.   ROS  Ten systems reviewed and is negative except as mentioned  in HPI    Objective Physical Exam CONSTITUTIONAL: Patient appears well-developed and well-nourished.  No distress. HEENT: Head atraumatic, normocephalic, neck supple. CARDIOVASCULAR: Normal rate, regular rhythm and normal heart sounds.  No murmur heard. No BLE edema. PULMONARY: Effort normal and breath sounds normal. No respiratory distress. ABDOMINAL: There is no tenderness or distention. MUSCULOSKELETAL: Normal gait. Without gross motor or sensory deficit. PSYCHIATRIC: Patient has a normal mood and affect. behavior is normal. Judgment and thought content normal.  Vitals:   02/03/24 1459  BP: 118/76  Pulse: 94  Resp: 16  Temp: 97.9 F (36.6 C)  TempSrc: Oral  SpO2: 94%  Weight: 147 lb (66.7 kg)  Height: 5' 3 (1.6 m)    Body mass index is 26.04 kg/m.  Recent Results (from the past 2160 hours)  CMP (Cancer Center only)     Status: Abnormal  Collection Time: 01/06/24  1:52 PM  Result Value Ref Range   Sodium 136 135 - 145 mmol/L   Potassium 3.4 (L) 3.5 - 5.1 mmol/L   Chloride 100 98 - 111 mmol/L   CO2 26 22 - 32 mmol/L   Glucose, Bld 123 (H) 70 - 99 mg/dL    Comment: Glucose reference range applies only to samples taken after fasting for at least 8 hours.   BUN 19 8 - 23 mg/dL   Creatinine 9.15 9.55 - 1.00 mg/dL   Calcium  9.6 8.9 - 10.3 mg/dL   Total Protein 7.4 6.5 - 8.1 g/dL   Albumin 4.4 3.5 - 5.0 g/dL   AST 19 15 - 41 U/L   ALT 16 0 - 44 U/L   Alkaline Phosphatase 91 38 - 126 U/L   Total Bilirubin 0.2 0.0 - 1.2 mg/dL   GFR, Estimated >39 >39 mL/min    Comment: (NOTE) Calculated using the CKD-EPI Creatinine Equation (2021)    Anion gap 10 5 - 15    Comment: Performed at [facility], [address] [place], [place] [address]  CBC with Differential (Cancer Center Only)     Status: Abnormal   Collection Time: 01/06/24  1:53 PM  Result Value Ref Range   WBC Count 7.9 4.0 - 10.5 K/uL   RBC 3.95 3.87 - 5.11 MIL/uL   Hemoglobin 11.4 (L) 12.0 - 15.0 g/dL    HCT 64.3 (L) 63.9 - 46.0 %   MCV 90.1 80.0 - 100.0 fL   MCH 28.9 26.0 - 34.0 pg   MCHC 32.0 30.0 - 36.0 g/dL   RDW 85.3 88.4 - 84.4 %   Platelet Count 213 150 - 400 K/uL   nRBC 0.0 0.0 - 0.2 %   Neutrophils Relative % 54 %   Neutro Abs 4.4 1.7 - 7.7 K/uL   Lymphocytes Relative 32 %   Lymphs Abs 2.5 0.7 - 4.0 K/uL   Monocytes Relative 8 %   Monocytes Absolute 0.6 0.1 - 1.0 K/uL   Eosinophils Relative 5 %   Eosinophils Absolute 0.4 0.0 - 0.5 K/uL   Basophils Relative 1 %   Basophils Absolute 0.0 0.0 - 0.1 K/uL   Immature Granulocytes 0 %   Abs Immature Granulocytes 0.03 0.00 - 0.07 K/uL    Comment: Performed at [facility], [address] [place], [place] [address]    Diabetic Foot Exam:     PHQ2/9:    02/03/2024    2:59 PM 10/26/2023    3:23 PM 10/19/2023    2:09 PM 07/30/2023   12:53 PM 06/22/2023    4:09 PM  Depression screen PHQ 2/9  Decreased Interest 0 0 0 0 0  Down, Depressed, Hopeless 0 0 0 0 2  PHQ - 2 Score 0 0 0 0 2  Altered sleeping 0   0 0  Tired, decreased energy 0   0 3  Change in appetite 0   0 3  Feeling bad or failure about yourself  0   0 0  Trouble concentrating 0   0 0  Moving slowly or fidgety/restless 0   0 0  Suicidal thoughts 0   0 0  PHQ-9 Score 0   0  8   Difficult doing work/chores Not difficult at all         Data saved with a previous flowsheet row definition    phq 9 is negative  Fall Risk:    02/03/2024  2:59 PM 10/26/2023    3:23 PM 07/30/2023   12:56 PM 06/22/2023    3:41 PM 02/17/2023    3:06 PM  Fall Risk   Falls in the past year? 0 0 0 0 0  Number falls in past yr: 0 0 0 0   Injury with Fall? 0 0  0  0    Risk for fall due to : No Fall Risks No Fall Risks No Fall Risks  No Fall Risks  Follow up Falls evaluation completed Falls evaluation completed Falls evaluation completed Falls evaluation completed Falls prevention discussed     Data saved with a previous flowsheet row definition      Assessment &  Plan Nocturnal cough and dyspnea Intermittent nocturnal cough and dyspnea for 2-3 weeks, differential includes asthma exacerbation, anxiety, or endocrine therapy side effects. No signs of CHF or PE. - Ordered chest x-ray for pulmonary evaluation. - Prescribed Advair inhaler for asthma. - Prescribed clonazepam for panic attacks, cautioning against long-term use. - Prescribed Buspar for anxiety. - Advised continuation of mirtazapine  for sleep. - Discussed medication side effects and symptom monitoring.  Asthma Childhood asthma with recent nocturnal symptoms suggesting exacerbation. - Prescribed Advair inhaler. - Educated on inhaler use with spacer.  Panic attacks and anxiety Nocturnal panic attacks and anxiety, possibly linked to dyspnea. No current anxiolytics. - Prescribed clonazepam for acute panic attacks, cautioning against long-term use. - Prescribed Buspar for anxiety BID 5 mg  - Discussed starting Lexapro  for long-term anxiety, follow-up in one month.  Intraductal carcinoma in situ of left breast, status post lumpectomy and radiation, on endocrine therapy On Arimidex  for endocrine therapy. Discussed potential side effects and symptom monitoring. - Advised discussing symptoms with oncologist Dr. Babara to rule out Arimidex  side effects. - Continue Arimidex  therapy.

## 2024-02-04 NOTE — Telephone Encounter (Signed)
 error

## 2024-02-08 ENCOUNTER — Other Ambulatory Visit: Payer: Self-pay | Admitting: Family Medicine

## 2024-02-08 ENCOUNTER — Ambulatory Visit: Payer: Self-pay | Admitting: Family Medicine

## 2024-02-08 DIAGNOSIS — I517 Cardiomegaly: Secondary | ICD-10-CM

## 2024-02-22 ENCOUNTER — Ambulatory Visit
Admission: RE | Admit: 2024-02-22 | Discharge: 2024-02-22 | Disposition: A | Discharge status: Home / Self Care | Source: Ambulatory Visit | Attending: Family Medicine | Admitting: Family Medicine

## 2024-02-22 ENCOUNTER — Ambulatory Visit: Payer: Self-pay | Admitting: Family Medicine

## 2024-02-22 ENCOUNTER — Other Ambulatory Visit: Payer: Self-pay | Admitting: Family Medicine

## 2024-02-22 DIAGNOSIS — E785 Hyperlipidemia, unspecified: Secondary | ICD-10-CM

## 2024-02-22 DIAGNOSIS — I700 Atherosclerosis of aorta: Secondary | ICD-10-CM

## 2024-02-22 DIAGNOSIS — I517 Cardiomegaly: Secondary | ICD-10-CM

## 2024-02-22 LAB — ECHOCARDIOGRAM COMPLETE
AR max vel: 1.99 cm2
AV Area VTI: 2 cm2
AV Area mean vel: 1.91 cm2
AV Mean grad: 4.5 mmHg
AV Peak grad: 8 mmHg
Ao pk vel: 1.41 m/s
Area-P 1/2: 3.16 cm2
MV VTI: 1.83 cm2
S' Lateral: 3 cm

## 2024-02-24 NOTE — Telephone Encounter (Signed)
 Requested by interface surescripts. Future visit 03/08/23. Requested Prescriptions  Pending Prescriptions Disp Refills   atorvastatin  (LIPITOR) 40 MG tablet [Pharmacy Med Name: ATORVASTATIN  CALCIUM  40 MG Oral Tablet] 90 tablet 0    Sig: TAKE 1 TABLET EVERY DAY     Cardiovascular:  Antilipid - Statins Failed - 02/24/2024  2:10 PM      Failed - Lipid Panel in normal range within the last 12 months    Cholesterol  Date Value Ref Range Status  05/12/2023 149 <200 mg/dL Final  95/87/7986 838 0 - 200 mg/dL Final   Ldl Cholesterol, Calc  Date Value Ref Range Status  06/13/2011 109 (H) 0 - 100 mg/dL Final   LDL Cholesterol (Calc)  Date Value Ref Range Status  05/12/2023 65 mg/dL (calc) Final    Comment:    Reference range: <100 . Desirable range <100 mg/dL for primary prevention;   <70 mg/dL for patients with CHD or diabetic patients  with > or = 2 CHD risk factors. SABRA LDL-C is now calculated using the Martin-Hopkins  calculation, which is a validated novel method providing  better accuracy than the Friedewald equation in the  estimation of LDL-C.  Gladis APPLETHWAITE et al. SANDREA. 7986;689(80): 2061-2068  (http://education.[facility].com/faq/FAQ164)    HDL Cholesterol  Date Value Ref Range Status  06/13/2011 32 (L) 40 - 60 mg/dL Final   HDL  Date Value Ref Range Status  05/12/2023 62 > OR = 50 mg/dL Final   Triglycerides  Date Value Ref Range Status  05/12/2023 140 <150 mg/dL Final  95/87/7986 899 0 - 200 mg/dL Final         Passed - Patient is not pregnant      Passed - Valid encounter within last 12 months    Recent Outpatient Visits           3 weeks ago Nocturnal dyspnea   [place] [facility] [name], MD   1 month ago Viral upper respiratory tract infection   [facility] [name], DO   4 months ago Diabetes mellitus with microalbuminuria [facility])   [place] [facility] [name], MD    8 months ago Diabetes mellitus with microalbuminuria [facility])   [facility] [name], MD   9 months ago Eustachian tube dysfunction, bilateral   [facility] [name], DO       Future Appointments             In 1 week [name], [name], MD [facility], [place]

## 2024-02-25 ENCOUNTER — Other Ambulatory Visit: Payer: Self-pay | Admitting: Family Medicine

## 2024-02-25 DIAGNOSIS — F410 Panic disorder [episodic paroxysmal anxiety] without agoraphobia: Secondary | ICD-10-CM

## 2024-02-25 DIAGNOSIS — F419 Anxiety disorder, unspecified: Secondary | ICD-10-CM

## 2024-02-26 NOTE — Telephone Encounter (Signed)
 Requested medication (s) are due for refill today: Yes  Requested medication (s) are on the active medication list: Yes  Last refill:  02/03/24  Future visit scheduled: Yes  Notes to clinic:  Routing to provider for approval of 3 month supply since just started on medication and 1 mo f/u needed     Requested Prescriptions  Pending Prescriptions Disp Refills   busPIRone  (BUSPAR ) 5 MG tablet [Pharmacy Med Name: BUSPIRONE  HCL 5 MG TABLET] 180 tablet 1    Sig: TAKE 1 TABLET BY MOUTH TWICE A DAY     Psychiatry: Anxiolytics/Hypnotics - Non-controlled Passed - 02/26/2024  6:30 PM      Passed - Valid encounter within last 12 months    Recent Outpatient Visits           3 weeks ago Nocturnal dyspnea   [facility] [name], MD   1 month ago Viral upper respiratory tract infection   [facility] [name], DO   4 months ago Diabetes mellitus with microalbuminuria [facility])   [facility] [name], MD   8 months ago Diabetes mellitus with microalbuminuria [facility])   [facility] [name], MD   9 months ago Eustachian tube dysfunction, bilateral   [facility] [name], DO       Future Appointments             In 1 week [name], [name], MD [facility], [place]             escitalopram  (LEXAPRO ) 5 MG tablet [Pharmacy Med Name: ESCITALOPRAM  5 MG TABLET] 90 tablet 1    Sig: Take 1 tablet (5 mg total) by mouth daily.     Psychiatry:  Antidepressants - SSRI Passed - 02/26/2024  6:30 PM      Passed - Valid encounter within last 6 months    Recent Outpatient Visits           3 weeks ago Nocturnal dyspnea   [facility] [name], MD   1 month ago Viral upper respiratory tract infection   [facility] [name], DO   4  months ago Diabetes mellitus with microalbuminuria [facility])    [facility] [name], MD   8 months ago Diabetes mellitus with microalbuminuria [facility])   [facility] [name], MD   9 months ago Eustachian tube dysfunction, bilateral   [facility] [name], DO       Future Appointments             In 1 week [name], [name], MD [facility], [place]

## 2024-03-07 ENCOUNTER — Encounter: Payer: Self-pay | Admitting: Family Medicine

## 2024-03-07 ENCOUNTER — Ambulatory Visit (INDEPENDENT_AMBULATORY_CARE_PROVIDER_SITE_OTHER): Admitting: Family Medicine

## 2024-03-07 VITALS — BP 130/74 | HR 80 | Resp 16 | Ht 63.0 in | Wt 148.5 lb

## 2024-03-07 DIAGNOSIS — E1169 Type 2 diabetes mellitus with other specified complication: Secondary | ICD-10-CM

## 2024-03-07 DIAGNOSIS — G43009 Migraine without aura, not intractable, without status migrainosus: Secondary | ICD-10-CM

## 2024-03-07 DIAGNOSIS — Z9012 Acquired absence of left breast and nipple: Secondary | ICD-10-CM

## 2024-03-07 DIAGNOSIS — E1159 Type 2 diabetes mellitus with other circulatory complications: Secondary | ICD-10-CM

## 2024-03-07 DIAGNOSIS — I152 Hypertension secondary to endocrine disorders: Secondary | ICD-10-CM

## 2024-03-07 DIAGNOSIS — R0600 Dyspnea, unspecified: Secondary | ICD-10-CM

## 2024-03-07 DIAGNOSIS — G8929 Other chronic pain: Secondary | ICD-10-CM

## 2024-03-07 DIAGNOSIS — E785 Hyperlipidemia, unspecified: Secondary | ICD-10-CM

## 2024-03-07 DIAGNOSIS — M25561 Pain in right knee: Secondary | ICD-10-CM

## 2024-03-07 LAB — POCT GLYCOSYLATED HEMOGLOBIN (HGB A1C): Hemoglobin A1C: 6.7 % — AB (ref 4.0–5.6)

## 2024-03-07 NOTE — Progress Notes (Signed)
 Name: Laurie Daniels   MRN: [id number]    DOB: 1955/06/10   Date:03/07/2024       Progress Note  Subjective  Chief Complaint  Chief Complaint  Patient presents with   Medical Management of Chronic Issues   Discussed the use of AI scribe software for clinical note transcription with the patient, who gave verbal consent to proceed.  History of Present Illness Laurie Daniels is a 69 year old female who presents for a regular follow-up visit.  Her A1c decreased from 6.8 in August to 6.7 currently. She manages her diabetes with diet control. No symptoms such as polyphagia, polydipsia, or polyuria.  She experiences chronic right knee pain that is severe enough to cause her to cry at night. The pain has been ongoing for four to five years, worsens with activity such as work and pushing a vacuum cleaner, and radiates down her leg. It is associated with swelling. She has not seen an orthopedic specialist or had an x-ray done for her knee pain.  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 She is currently taking Arimidex  as part of her treatment regimen.  She has experienced shortness of breath and nocturnal dyspnea, which was previously evaluated with a chest x-ray that showed cardiomegaly but  echocardiogram was normal. She was prescribed Advair but has not used it. She also reports anxiety and was prescribed buspirone  5 mg, which she has at home but does not regularly take. She has tried clonazepam  and escitalopram  in the past but experienced adverse effects such as dizziness and hallucinations, therefore no longer taking either but symptoms of SOB and nocturnal dyspnea have resolved .  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   Osteoarthritis of right knee 10/23/2021   Pain in joint of right knee 10/23/2021   Hyperparathyroidism 05/10/2021   Dyslipidemia associated with type 2 diabetes mellitus (HCC) 05/10/2021   Chronic constipation 01/30/2016   Allergic rhinitis 08/13/2015   Carpal tunnel syndrome 08/13/2015   Atherosclerosis of aorta 08/13/2015   Nocturia 08/13/2015   Migraine without aura and responsive to treatment 08/13/2015   Thyroid  goiter 08/13/2015   Diabetes mellitus with microalbuminuria (HCC) 03/05/2015   Insomnia 10/12/2014   Hyperlipidemia 10/12/2014    Past Surgical History:  Procedure Laterality Date   AXILLARY SENTINEL NODE BIOPSY Left 07/17/2023   Procedure: BIOPSY, LYMPH NODE, SENTINEL, AXILLARY;  Surgeon: Rodolph Romano, MD;  Location: [facility];  Service: General;  Laterality: Left;   BREAST BIOPSY Left 07/03/2023   Stereo bx, ribbon clip, path pending   BREAST BIOPSY Left 07/03/2023   MM LT BREAST BX W LOC DEV 1ST LESION IMAGE BX SPEC STEREO GUIDE 07/03/2023 [facility]   BREAST BIOPSY  07/14/2023   MM LT BREAST SAVI/RF TAG 1ST LESION MAMMO GUIDE 07/14/2023 [facility]   BREAST LUMPECTOMY WITH RADIO FREQUENCY LOCALIZER Left 07/17/2023   Procedure: LEFT BREAST PARTIAL MASTECTOMY  WITH RADIO FREQUENCY LOCALIZER;  Surgeon: Rodolph Romano, MD;  Location: [facility];  Service: General;  Laterality: Left;   CATARACT EXTRACTION Right 08/31/2017   CESAREAN SECTION     COLONOSCOPY  06/17/2007   Dr Jinny   COLONOSCOPY WITH PROPOFOL  N/A 07/22/2017   Procedure: COLONOSCOPY WITH PROPOFOL ;  Surgeon: Dessa Reyes ORN, MD;  Location: [facility];  Service: Endoscopy;  Laterality: N/A;   TUBAL LIGATION     vitrectomy Right 07/10/2016   done at [facility] History  Problem Relation Age of Onset   Hypertension Mother    Diabetes Mother    Lung cancer  Father    Colon cancer Neg Hx     Social History   Tobacco Use   Smoking status: Never   Smokeless tobacco: Never  Substance Use Topics   Alcohol use: No    Alcohol/week: 0.0 standard drinks of alcohol    Current Medications[1]  Allergies[2]  I personally reviewed active problem list, medication list, allergies, family history with the patient/caregiver today.   ROS  Ten systems reviewed and is negative except as mentioned in HPI    Objective Physical Exam CONSTITUTIONAL: Patient appears well-developed and well-nourished. No distress. HEENT: Head atraumatic, normocephalic, neck supple. CARDIOVASCULAR: Normal rate, regular rhythm and normal heart sounds. No murmur heard. No BLE edema. Extremities normal. PULMONARY: Effort normal and breath sounds normal. No respiratory distress. ABDOMINAL: There is no tenderness or distention. MUSCULOSKELETAL: crepitus with extension of right knee  PSYCHIATRIC: Patient has a normal mood and affect. Behavior is normal. Judgment and thought content normal.  Vitals:   03/07/24 1554  BP: 130/74  Pulse: 80  Resp: 16  SpO2: 95%  Weight: 148 lb 8 oz (67.4 kg)  Height: 5' 3 (1.6 m)    Body mass index is 26.31 kg/m.  Recent Results (from the past 2160 hours)  CMP (Cancer Center only)     Status: Abnormal   Collection Time: 01/06/24  1:52 PM  Result Value Ref Range   Sodium 136 135 - 145 mmol/L   Potassium 3.4 (L) 3.5 - 5.1 mmol/L   Chloride 100 98 - 111 mmol/L   CO2 26 22 - 32 mmol/L   Glucose, Bld 123 (H) 70 - 99 mg/dL    Comment: Glucose reference range applies only to samples taken after fasting for at least 8 hours.   BUN 19 8 - 23 mg/dL   Creatinine 9.15 9.55 - 1.00 mg/dL   Calcium  9.6 8.9 - 10.3 mg/dL   Total Protein 7.4 6.5 - 8.1 g/dL   Albumin 4.4 3.5 - 5.0 g/dL   AST 19 15 - 41 U/L   ALT 16 0 - 44 U/L   Alkaline Phosphatase 91 38 - 126 U/L   Total Bilirubin 0.2 0.0 - 1.2 mg/dL   GFR, Estimated >39 >39 mL/min     Comment: (NOTE) Calculated using the CKD-EPI Creatinine Equation (2021)    Anion gap 10 5 - 15    Comment: Performed at [facility], [address] [place], [place] [address]  CBC with Differential (Cancer Center Only)     Status: Abnormal   Collection Time: 01/06/24  1:53 PM  Result Value Ref Range   WBC Count 7.9 4.0 - 10.5 K/uL   RBC 3.95 3.87 - 5.11 MIL/uL   Hemoglobin 11.4 (L) 12.0 - 15.0 g/dL   HCT 64.3 (L) 63.9 - 53.9 %   MCV 90.1 80.0 - 100.0 fL   MCH 28.9 26.0 - 34.0 pg   MCHC 32.0 30.0 - 36.0 g/dL   RDW 85.3 88.4 - 84.4 %   Platelet Count 213 150 - 400 K/uL   nRBC 0.0 0.0 -  0.2 %   Neutrophils Relative % 54 %   Neutro Abs 4.4 1.7 - 7.7 K/uL   Lymphocytes Relative 32 %   Lymphs Abs 2.5 0.7 - 4.0 K/uL   Monocytes Relative 8 %   Monocytes Absolute 0.6 0.1 - 1.0 K/uL   Eosinophils Relative 5 %   Eosinophils Absolute 0.4 0.0 - 0.5 K/uL   Basophils Relative 1 %   Basophils Absolute 0.0 0.0 - 0.1 K/uL   Immature Granulocytes 0 %   Abs Immature Granulocytes 0.03 0.00 - 0.07 K/uL    Comment: Performed at [facility], [address] [place], [place] [address]  ECHOCARDIOGRAM COMPLETE     Status: None   Collection Time: 02/22/24 11:14 AM  Result Value Ref Range   Ao pk vel 1.41 m/s   AV Area VTI 2.00 cm2   AR max vel 1.99 cm2   AV Mean grad 4.5 mmHg   AV Peak grad 8.0 mmHg   S' Lateral 3.00 cm   AV Area mean vel 1.91 cm2   Area-P 1/2 3.16 cm2   MV VTI 1.83 cm2   Est EF 55 - 60%   POCT glycosylated hemoglobin (Hb A1C)     Status: Abnormal   Collection Time: 03/07/24  3:58 PM  Result Value Ref Range   Hemoglobin A1C 6.7 (A) 4.0 - 5.6 %   HbA1c POC (<> result, manual entry)     HbA1c, POC (prediabetic range)     HbA1c, POC (controlled diabetic range)      Diabetic Foot Exam:     PHQ2/9:    03/07/2024    3:49 PM 02/03/2024    2:59 PM 10/26/2023    3:23 PM 10/19/2023    2:09 PM 07/30/2023   12:53 PM  Depression screen PHQ 2/9  Decreased Interest  0 0 0 0 0  Down, Depressed, Hopeless 0 0 0 0 0  PHQ - 2 Score 0 0 0 0 0  Altered sleeping 0 0   0  Tired, decreased energy 0 0   0  Change in appetite 0 0   0  Feeling bad or failure about yourself  0 0   0  Trouble concentrating 0 0   0  Moving slowly or fidgety/restless 0 0   0  Suicidal thoughts 0 0   0  PHQ-9 Score 0 0   0   Difficult doing work/chores Not difficult at all Not difficult at all        Data saved with a previous flowsheet row definition    phq 9 is negative  Fall Risk:    03/07/2024    3:49 PM 02/03/2024    2:59 PM 10/26/2023    3:23 PM 07/30/2023   12:56 PM 06/22/2023    3:41 PM  Fall Risk   Falls in the past year? 0 0 0 0 0  Number falls in past yr: 0 0 0 0 0  Injury with Fall? 0 0 0  0  0   Risk for fall due to : No Fall Risks No Fall Risks No Fall Risks No Fall Risks   Follow up Falls evaluation completed Falls evaluation completed Falls evaluation completed Falls evaluation completed Falls evaluation completed     Data saved with a previous flowsheet row definition      Assessment & Plan Type 2 diabetes mellitus with dyslipidemia and HTN Improved glycemic control with decreased Hemoglobin A1c. - Continue diet control for diabetes management. -  continue losartan  and statin therapy   Dyslipidemia Management with atorvastatin . Uncertainty about current medication regimen. - Bring medication bottles to next appointment for review.  Chronic right knee pain Severe pain possibly due to arthritis. No prior imaging or orthopedic evaluation. - Referred to orthopedic surgery for evaluation and management.  History of carcinoma of left breast, s/p mastectomy History of carcinoma of the left breast with mastectomy. Currently on immunosuppressive medication.  Anxiety disorder Managed with buspirone . Previous medications not tolerated. - Continue buspirone  as needed for anxiety.        [1]  Current Outpatient Medications:    anastrozole  (ARIMIDEX ) 1  MG tablet, TAKE 1 TABLET BY MOUTH EVERY DAY, Disp: 90 tablet, Rfl: 1   aspirin 81 MG tablet, Take 81 mg by mouth daily., Disp: , Rfl:    atorvastatin  (LIPITOR) 40 MG tablet, TAKE 1 TABLET EVERY DAY, Disp: 90 tablet, Rfl: 0   busPIRone  (BUSPAR ) 5 MG tablet, Take 1 tablet (5 mg total) by mouth 2 (two) times daily., Disp: 60 tablet, Rfl: 0   Cholecalciferol (VITAMIN D3) 50 MCG (2000 UT) capsule, TAKE 1 CAPSULE EVERY DAY, Disp: 100 capsule, Rfl: 0   clonazePAM  (KLONOPIN ) 0.5 MG tablet, Take 1 tablet (0.5 mg total) by mouth daily as needed for anxiety., Disp: 5 tablet, Rfl: 0   Continuous Glucose Sensor (DEXCOM G7 SENSOR) MISC, 1 applicator by Does not apply route as needed., Disp: 2 each, Rfl: 5   escitalopram  (LEXAPRO ) 5 MG tablet, Take 1 tablet (5 mg total) by mouth daily., Disp: 30 tablet, Rfl: 0   fluticasone -salmeterol (ADVAIR) 100-50 MCG/ACT AEPB, Inhale 1 puff into the lungs 2 (two) times daily., Disp: 1 each, Rfl: 0   losartan  (COZAAR ) 100 MG tablet, TAKE 1 TABLET EVERY DAY, Disp: 90 tablet, Rfl: 0   mirtazapine  (REMERON ) 15 MG tablet, TAKE 1 TABLET AT BEDTIME, Disp: 90 tablet, Rfl: 0   omeprazole  (PRILOSEC) 20 MG capsule, Take 1 capsule (20 mg total) by mouth daily., Disp: 90 capsule, Rfl: 1   vitamin B-12 (CYANOCOBALAMIN ) 100 MCG tablet, Take 100 mcg by mouth daily., Disp: , Rfl:  [2] No Known Allergies

## 2024-03-31 ENCOUNTER — Telehealth: Payer: Self-pay | Admitting: *Deleted

## 2024-03-31 DIAGNOSIS — D0512 Intraductal carcinoma in situ of left breast: Secondary | ICD-10-CM

## 2024-03-31 NOTE — Telephone Encounter (Signed)
 Caller verified using pt's full name and dob prior to discussing PHI   Patient reports swollen left hard breast. Nipple is hard and painful. Symptoms onset was 3 weeks ago and breast edema is now worse. 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 She doesn't know if the lifting caused the swelling to the left breast. She denies any redness to the breast. H/o DCIS and s/p lumpectomy. H/o of post op seroma in left breast and axillary area. Last saw [facility] in June 2025. Pt denies any range of motion issues if her left upper extremity. Patient would like the area to be evaluated by provider.   Msg sent to Morna, NP and Tinnie Dawn, NP and Dr. Paullette Tinnie is ok with evaluating patient if Dr. Babara is ok with this plan. Pt stated that she could come tomorrow at 1030 am.

## 2024-04-01 ENCOUNTER — Inpatient Hospital Stay: Attending: Oncology | Admitting: Nurse Practitioner

## 2024-04-01 ENCOUNTER — Encounter: Payer: Self-pay | Admitting: Nurse Practitioner

## 2024-04-01 VITALS — BP 122/64 | HR 62 | Temp 98.1°F | Resp 16 | Wt 148.0 lb

## 2024-04-01 DIAGNOSIS — N6489 Other specified disorders of breast: Secondary | ICD-10-CM

## 2024-04-01 DIAGNOSIS — D0512 Intraductal carcinoma in situ of left breast: Secondary | ICD-10-CM | POA: Diagnosis not present

## 2024-04-01 NOTE — Telephone Encounter (Signed)
 Dr. Babara recommends [facility] eval. Pt scheduled to see Lauren NP today at 10:30a

## 2024-04-01 NOTE — Progress Notes (Signed)
 "  Symptom Management Clinic  [facility] A Department of the [place]. [facility] [address] [place], [place] [address] [phone] (phone) [phone] (fax)  Patient Care Team: [name], [name], MD as PCP - General (Family Medicine) Georgina Shasta POUR, RN as Oncology Nurse Navigator Babara Call, MD as Consulting Physician (Oncology) Lenn Aran, MD as Consulting Physician (Radiation Oncology) [place], [facility]   Name of the patient: Laurie Daniels  [id number]  Mar 07, 1955   Date of visit: 04/01/24  Diagnosis-high-grade DCIS of the left breast  Chief complaint/ Reason for visit-breast fullness and edema  Heme/Onc history:  Oncology History  Ductal carcinoma in situ (DCIS) of left breast  06/29/2023 Mammogram   Screening mammogram  In the left breast, calcifications warrant further evaluation. In the right breast, no findings suspicious for malignancy.   07/01/2023 Mammogram   Bilateral diagnostic mammogram  LEFT breast 5 mm group of amorphous calcification in the upper-outer quadrant is low suspicion for malignancy. Recommend further assessment with stereotactic guided biopsy.   07/09/2023 Initial Diagnosis   Ductal carcinoma in situ (DCIS) of left breast  Patient underwent left breast biopsy.  Pathology showed 1. Breast, left, needle core biopsy, upper outer quadrant (ribbon clip) :   - DUCTAL CARCINOMA IN SITU, HIGH GRADE   - FOCAL MICROINVASION CANNOT BE ENTIRELY RULED OUT   - NECROSIS: PRESENT   - CALCIFICATIONS: PRESENT   - DCIS LENGTH: 0.2 CM     First live birth at age of 27 OCP use: yes History of hysterectomy: no Menopausal status: post History of HRT use: no History of chest radiation: no Number of previous breast biopsies:  no   07/09/2023 Cancer Staging   Staging form: Breast, AJCC 8th Edition - Clinical stage from 07/09/2023: Stage 0 (cTis (DCIS), cN0, cM0, G3) - Signed by Babara Call, MD on  07/09/2023 Stage prefix: Initial diagnosis Histologic grading system: 3 grade system   07/17/2023 Surgery      1. Breast, lumpectomy, left breast mass :       BREAST TISSUE WITH EXTENSIVE INFLAMMATION AND FOREIGN MATERIAL REACTION       CONSISTENT WITH BIOPSY SITE CHANGES.       FOCAL ATYPICAL DUCTAL HYPERPLASIA (0.2 MM).       BIOPSY CLIP (RIBBON SHAPED) IDENTIFIED.       NO EVIDENCE OF RESIDUAL CARCINOMA.       SEE ONCOLOGY TABLE.   2. Lymph node, sentinel, biopsy, left axillary #1 :      ONE LYMPH NODE, NEGATIVE FOR METASTATIC CARCINOMA (0/1).       3. Breast, excision, left breast reexcision, posterior margin stitch inside cavity :      BENIGN BREAST TISSUE.      NEGATIVE FOR ATYPIA OR MALIGNANCY.       4. Breast, excision, left breast anterior margin stitch inside cavity, anterior margin stitch inside cavity :      BENIGN BREAST TISSUE WITH APOCRINE CYSTS.      NEGATIVE FOR ATYPIA OR MALIGNANCY.       5. Lymph node, sentinel, biopsy, left axillary #2 :      ONE LYMPH NODE, NEGATIVE FOR METASTATIC CARCINOMA (0/1).  ONCOLOGY TABLE      DCIS OF THE BREAST:  Resection ([id number])      Biopsy ([id number])      Procedure: Lumpectomy      Specimen Laterality: Left      Histologic Type: No residual carcinoma  in the resection, Ductal carcinoma in      situ in the biopsy      Size of DCIS: 0.2 cm ([id number])      Nuclear Grade: High - grade (biopsy)      Necrosis: Present (biopsy)      Margins: All margins negative for DCIS      Specify Closest Margin (required only if <12mm): NA      Regional Lymph Nodes: Not applicable (no lymph nodes submitted or found)      Number of Lymph Nodes Examined: 0      Number of Sentinel Nodes Examined (if applicable): 0      Number of Lymph Nodes with Macrometastases: NA      Number of Lymph Nodes with Micrometastases): NA      Number of Lymph Nodes with Isolated Tumor Cells (=0.2 mm or =200 cells): NA      Extranodal Extension: NA       Breast Biomarker Testing Performed on Previous Biopsy:      Testing performed on Case Number: [id number]      Estrogen Receptor: 100%, positive, strong staining intensity      Progesterone Receptor: 50%, positive, weak-moderate staining intensity      Pathologic Stage Classification (pTNM, AJCC 8th Edition): pTis, pNx     08/25/2023 - 09/14/2023 Radiation Therapy   Status post left breast adjuvant radiation.   09/30/2023 -  Anti-estrogen oral therapy   Started on Arimidex  1 mg daily.     Interval history-Laurie Daniels is a 69 year old female with above history of left breast high-grade DCIS status post lumpectomy and radiation now on Arimidex  since July 2025 who presents to symptom management clinic for complaints of left breast fullness and general discomfort.  She says for the past several weeks her left breast in the outer quadrant has become harder and more full feeling.  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  Symptoms seem to be gradually worsening and becoming more bothersome.  Breast is not warm to touch.  No fevers or chills.  No lesions or discharge.  Review of systems- Review of Systems  Constitutional:  Negative for chills, fever, malaise/fatigue and weight loss.  HENT:  Negative for hearing loss, nosebleeds, sore throat and tinnitus.   Eyes:  Negative for blurred vision and double vision.  Respiratory:  Negative for cough, hemoptysis, shortness of breath and wheezing.   Cardiovascular:  Negative for chest pain, palpitations and leg swelling.  Gastrointestinal:  Negative for abdominal pain, blood in stool, constipation, diarrhea, melena, nausea and vomiting.  Genitourinary:  Negative for dysuria and urgency.  Musculoskeletal:  Negative for back pain, falls, joint pain and myalgias.  Skin:  Negative for itching and rash.  Neurological:  Negative for dizziness, tingling, sensory change, loss of consciousness, weakness and headaches.   Endo/Heme/Allergies:  Negative for environmental allergies. Does not bruise/bleed easily.  Psychiatric/Behavioral:  Negative for depression. The patient is not nervous/anxious and does not have insomnia.     Allergies[1]  Past Medical History:  Diagnosis Date   Anxiety and depression    Atherosclerosis of aorta    CTS (carpal tunnel syndrome)    Depressive disorder    Diabetic neuropathy (HCC)    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   Hyperactivity of bladder    Hypertension    Insomnia    Lumbar sprain    Ovarian failure    Rhinitis  Type 2 diabetes mellitus without complication    Vaginitis and vulvovaginitis     Past Surgical History:  Procedure Laterality Date   AXILLARY SENTINEL NODE BIOPSY Left 07/17/2023   Procedure: BIOPSY, LYMPH NODE, SENTINEL, AXILLARY;  Surgeon: Rodolph Romano, MD;  Location: [facility] OR[facility];  Service: General;  Laterality: Left;   BREAST BIOPSY Left 07/03/2023   Stereo bx, ribbon clip, path pending   BREAST BIOPSY Left 07/03/2023   MM LT BREAST BX W LOC DEV 1ST LESION IMAGE BX SPEC STEREO GUIDE 07/03/2023 [facility]   BREAST BIOPSY  07/14/2023   MM LT BREAST SAVI/RF TAG 1ST LESION MAMMO GUIDE 07/14/2023 [facility]   BREAST LUMPECTOMY WITH RADIO FREQUENCY LOCALIZER Left 07/17/2023   Procedure: LEFT BREAST PARTIAL MASTECTOMY  WITH RADIO FREQUENCY LOCALIZER;  Surgeon: Rodolph Romano, MD;  Location: [facility] OR[facility];  Service: General;  Laterality: Left;   CATARACT EXTRACTION Right 08/31/2017   CESAREAN SECTION     COLONOSCOPY  06/17/2007   Dr Jinny   COLONOSCOPY WITH PROPOFOL  N/A 07/22/2017   Procedure: COLONOSCOPY WITH PROPOFOL ;  Surgeon: Dessa Reyes ORN, MD;  Location: [facility];  Service: Endoscopy;  Laterality: N/A;   TUBAL LIGATION     vitrectomy Right 07/10/2016   done at [facility]    Social History   Socioeconomic History   Marital status: Divorced    Spouse name: Not on file   Number of children: 2   Years  of education: Not on file   Highest education level: 12th grade  Occupational History   Occupation: housekeeping   Tobacco Use   Smoking status: Never   Smokeless tobacco: Never  Vaping Use   Vaping status: Never Used  Substance and Sexual Activity   Alcohol use: No    Alcohol/week: 0.0 standard drinks of alcohol   Drug use: No   Sexual activity: Yes    Partners: Male  Other Topics Concern   Not on file  Social History Narrative   Divorced since 2005, she has a boyfriend but they don't live together      Pt's son and granddaughter lives with her   Social Drivers of Health   Tobacco Use: Low Risk (04/01/2024)   Patient History    Smoking Tobacco Use: Never    Smokeless Tobacco Use: Never    Passive Exposure: Not on file  Financial Resource Strain: Low Risk (07/30/2023)   Overall Financial Resource Strain (CARDIA)    Difficulty of Paying Living Expenses: Not hard at all  Recent Concern: Financial Resource Strain - High Risk (07/09/2023)   Received from [facility]   Overall Financial Resource Strain (CARDIA)    Difficulty of Paying Living Expenses: Hard  Food Insecurity: No Food Insecurity (07/30/2023)   Hunger Vital Sign    Worried About Running Out of Food in the Last Year: Never true    Ran Out of Food in the Last Year: Never true  Recent Concern: Food Insecurity - Food Insecurity Present (07/09/2023)   Received from [facility]   Epic    Within the past 12 months, you worried that your food would run out before you got the money to buy more.: Sometimes true    Within the past 12 months, the food you bought just didn't last and you didn't have money to get more.: Never true  Transportation Needs: No Transportation Needs (07/30/2023)   PRAPARE - Administrator, Civil Service (Medical): No    Lack of Transportation (Non-Medical): No  Physical Activity: Insufficiently Active (07/30/2023)   Exercise Vital Sign    Days of Exercise  per Week: 3 days    Minutes of Exercise per Session: 30 min  Stress: No Stress Concern Present (07/30/2023)   [employer] of Occupational Health - Occupational Stress Questionnaire    Feeling of Stress : Not at all  Social Connections: Moderately Isolated (07/30/2023)   Social Connection and Isolation Panel    Frequency of Communication with Friends and Family: More than three times a week    Frequency of Social Gatherings with Friends and Family: More than three times a week    Attends Religious Services: More than 4 times per year    Active Member of [employer] or Organizations: No    Attends Banker Meetings: Never    Marital Status: Divorced  Catering Manager Violence: Not At Risk (07/30/2023)   Humiliation, Afraid, Rape, and Kick questionnaire    Fear of Current or Ex-Partner: No    Emotionally Abused: No    Physically Abused: No    Sexually Abused: No  Depression (PHQ2-9): Low Risk (04/01/2024)   Depression (PHQ2-9)    PHQ-2 Score: 0  Alcohol Screen: Low Risk (07/30/2023)   Alcohol Screen    Last Alcohol Screening Score (AUDIT): 0  Housing: Unknown (07/30/2023)   Housing Stability Vital Sign    Unable to Pay for Housing in the Last Year: No    Number of Times Moved in the Last Year: Not on file    Homeless in the Last Year: No  Utilities: Not At Risk (07/30/2023)   AHC Utilities    Threatened with loss of utilities: No  Health Literacy: Adequate Health Literacy (07/30/2023)   B1300 Health Literacy    Frequency of need for help with medical instructions: Never    Family History  Problem Relation Age of Onset   Hypertension Mother    Diabetes Mother    Lung cancer Father    Colon cancer Neg Hx     Current Medications[2]  Physical exam:  Vitals:   04/01/24 1040  BP: 122/64  Pulse: 62  Resp: 16  Temp: 98.1 F (36.7 C)  TempSrc: Tympanic  SpO2: 97%  Weight: 148 lb (67.1 kg)   Physical Exam Vitals reviewed.  Constitutional:      Appearance: She is  not ill-appearing.  Chest:     Comments: Breast exam was performed in seated and lying down position. Patient is status post left lumpectomy with a well-healed surgical scar. Left breast surrounding surgical site is firm extending circumferentially. No distinct masses palpated. No evidence of axillary adenopathy. No apparent erythema or warmth.  Neurological:     Mental Status: She is alert.     Assessment and plan- Patient is a 69 y.o. female with history of high grade DCIS s/p lumpectomy, radiation, and now on aromatase inhibitor who presents to symptom management clinic for complaints of left breast fullness. Suspect radiation induced fibrosis vs breast lymphedema. No apparent plaques, nodes, or eccymosis. Plan for mammogram and ultrasound to evaluate further. If benign, plan to refer to PT for lymphedema management. Discussed use of compression bra for comfort.   Return to clinic if symptoms do not improve or worsen. Otherwise, follow up with Dr Babara as scheduled.    Visit Diagnosis 1. Fullness of breast   2. Ductal carcinoma in situ (DCIS) of left breast     Patient expressed understanding and was in agreement with this plan. She also understands that  She can call clinic at any time with any questions, concerns, or complaints.   Thank you for allowing me to participate in the care of this very pleasant patient.   Tinnie Dawn, DNP, AGNP-C, AOCNP [facility] [phone]  CC: Dr Babara        [1] No Known Allergies [2]  Current Outpatient Medications:    anastrozole  (ARIMIDEX ) 1 MG tablet, TAKE 1 TABLET BY MOUTH EVERY DAY, Disp: 90 tablet, Rfl: 1   aspirin 81 MG tablet, Take 81 mg by mouth daily., Disp: , Rfl:    atorvastatin  (LIPITOR) 40 MG tablet, TAKE 1 TABLET EVERY DAY, Disp: 90 tablet, Rfl: 0   busPIRone  (BUSPAR ) 5 MG tablet, Take 1 tablet (5 mg total) by mouth 2 (two) times daily., Disp: 60 tablet, Rfl: 0   Cholecalciferol (VITAMIN D3) 50 MCG (2000 UT)  capsule, TAKE 1 CAPSULE EVERY DAY, Disp: 100 capsule, Rfl: 0   Continuous Glucose Sensor (DEXCOM G7 SENSOR) MISC, 1 applicator by Does not apply route as needed., Disp: 2 each, Rfl: 5   fluticasone -salmeterol (ADVAIR) 100-50 MCG/ACT AEPB, Inhale 1 puff into the lungs 2 (two) times daily., Disp: 1 each, Rfl: 0   losartan  (COZAAR ) 100 MG tablet, TAKE 1 TABLET EVERY DAY, Disp: 90 tablet, Rfl: 0   mirtazapine  (REMERON ) 15 MG tablet, TAKE 1 TABLET AT BEDTIME, Disp: 90 tablet, Rfl: 0   omeprazole  (PRILOSEC) 20 MG capsule, Take 1 capsule (20 mg total) by mouth daily., Disp: 90 capsule, Rfl: 1   vitamin B-12 (CYANOCOBALAMIN ) 100 MCG tablet, Take 100 mcg by mouth daily., Disp: , Rfl:   "

## 2024-04-06 ENCOUNTER — Other Ambulatory Visit: Payer: Self-pay | Admitting: Oncology

## 2024-04-06 ENCOUNTER — Ambulatory Visit
Admission: RE | Admit: 2024-04-06 | Discharge: 2024-04-06 | Disposition: A | Source: Ambulatory Visit | Attending: Oncology

## 2024-04-06 ENCOUNTER — Inpatient Hospital Stay: Admission: RE | Admit: 2024-04-06 | Discharge: 2024-04-06 | Attending: Nurse Practitioner

## 2024-04-06 DIAGNOSIS — R928 Other abnormal and inconclusive findings on diagnostic imaging of breast: Secondary | ICD-10-CM

## 2024-04-06 DIAGNOSIS — D0512 Intraductal carcinoma in situ of left breast: Secondary | ICD-10-CM

## 2024-04-06 DIAGNOSIS — Z923 Personal history of irradiation: Secondary | ICD-10-CM | POA: Insufficient documentation

## 2024-04-06 DIAGNOSIS — Z86000 Personal history of in-situ neoplasm of breast: Secondary | ICD-10-CM | POA: Insufficient documentation

## 2024-04-06 HISTORY — DX: Personal history of irradiation: Z92.3

## 2024-04-06 MED ORDER — LIDOCAINE HCL 1 % IJ SOLN
5.0000 mL | Freq: Once | INTRAMUSCULAR | Status: AC
  Administered 2024-04-06: 5 mL
  Filled 2024-04-06: qty 5

## 2024-04-07 ENCOUNTER — Ambulatory Visit: Payer: Self-pay | Admitting: Nurse Practitioner

## 2024-04-07 DIAGNOSIS — N6489 Other specified disorders of breast: Secondary | ICD-10-CM

## 2024-04-07 NOTE — Telephone Encounter (Signed)
 Spoke to patient post mammogram, ultrasound. She underwent aspiration of seroma. Pain has now resolved and she denies complaints.

## 2024-04-11 ENCOUNTER — Encounter

## 2024-04-11 ENCOUNTER — Other Ambulatory Visit

## 2024-04-20 ENCOUNTER — Inpatient Hospital Stay: Admitting: *Deleted

## 2024-04-20 ENCOUNTER — Ambulatory Visit: Admitting: Radiation Oncology

## 2024-07-05 ENCOUNTER — Ambulatory Visit: Admitting: Family Medicine

## 2024-07-05 ENCOUNTER — Inpatient Hospital Stay

## 2024-07-05 ENCOUNTER — Inpatient Hospital Stay: Admitting: Oncology

## 2024-08-12 ENCOUNTER — Ambulatory Visit
